# Patient Record
Sex: Female | Born: 1935 | Race: White | Hispanic: No | Smoking: Never smoker
Health system: Southern US, Community
[De-identification: ages and names within clinical notes are randomized; demographics above are authoritative.]

## PROBLEM LIST (undated history)

## (undated) DIAGNOSIS — R51 Headache: Secondary | ICD-10-CM

## (undated) DIAGNOSIS — F329 Major depressive disorder, single episode, unspecified: Secondary | ICD-10-CM

## (undated) DIAGNOSIS — K589 Irritable bowel syndrome without diarrhea: Secondary | ICD-10-CM

## (undated) DIAGNOSIS — I10 Essential (primary) hypertension: Secondary | ICD-10-CM

## (undated) DIAGNOSIS — E785 Hyperlipidemia, unspecified: Secondary | ICD-10-CM

## (undated) DIAGNOSIS — F419 Anxiety disorder, unspecified: Secondary | ICD-10-CM

## (undated) DIAGNOSIS — D649 Anemia, unspecified: Secondary | ICD-10-CM

## (undated) DIAGNOSIS — C801 Malignant (primary) neoplasm, unspecified: Secondary | ICD-10-CM

## (undated) DIAGNOSIS — L930 Discoid lupus erythematosus: Secondary | ICD-10-CM

## (undated) DIAGNOSIS — N289 Disorder of kidney and ureter, unspecified: Secondary | ICD-10-CM

## (undated) DIAGNOSIS — I4891 Unspecified atrial fibrillation: Secondary | ICD-10-CM

## (undated) DIAGNOSIS — G629 Polyneuropathy, unspecified: Secondary | ICD-10-CM

## (undated) DIAGNOSIS — K279 Peptic ulcer, site unspecified, unspecified as acute or chronic, without hemorrhage or perforation: Secondary | ICD-10-CM

## (undated) DIAGNOSIS — F32A Depression, unspecified: Secondary | ICD-10-CM

## (undated) DIAGNOSIS — M519 Unspecified thoracic, thoracolumbar and lumbosacral intervertebral disc disorder: Secondary | ICD-10-CM

## (undated) DIAGNOSIS — M5136 Other intervertebral disc degeneration, lumbar region: Secondary | ICD-10-CM

## (undated) DIAGNOSIS — G5760 Lesion of plantar nerve, unspecified lower limb: Secondary | ICD-10-CM

## (undated) DIAGNOSIS — M51369 Other intervertebral disc degeneration, lumbar region without mention of lumbar back pain or lower extremity pain: Secondary | ICD-10-CM

## (undated) HISTORY — DX: Headache: R51

## (undated) HISTORY — PX: ABDOMINAL HYSTERECTOMY: SHX81

## (undated) HISTORY — PX: LUMBAR LAMINECTOMY: SHX95

## (undated) HISTORY — DX: Discoid lupus erythematosus: L93.0

## (undated) HISTORY — DX: Anxiety disorder, unspecified: F41.9

## (undated) HISTORY — PX: TOTAL KNEE ARTHROPLASTY: SHX125

## (undated) HISTORY — DX: Essential (primary) hypertension: I10

## (undated) HISTORY — DX: Peptic ulcer, site unspecified, unspecified as acute or chronic, without hemorrhage or perforation: K27.9

## (undated) HISTORY — DX: Lesion of plantar nerve, unspecified lower limb: G57.60

## (undated) HISTORY — DX: Anemia, unspecified: D64.9

## (undated) HISTORY — DX: Other intervertebral disc degeneration, lumbar region: M51.36

## (undated) HISTORY — DX: Polyneuropathy, unspecified: G62.9

## (undated) HISTORY — DX: Malignant (primary) neoplasm, unspecified: C80.1

## (undated) HISTORY — DX: Unspecified thoracic, thoracolumbar and lumbosacral intervertebral disc disorder: M51.9

## (undated) HISTORY — DX: Major depressive disorder, single episode, unspecified: F32.9

## (undated) HISTORY — DX: Unspecified atrial fibrillation: I48.91

## (undated) HISTORY — DX: Hyperlipidemia, unspecified: E78.5

## (undated) HISTORY — DX: Irritable bowel syndrome, unspecified: K58.9

## (undated) HISTORY — DX: Other intervertebral disc degeneration, lumbar region without mention of lumbar back pain or lower extremity pain: M51.369

## (undated) HISTORY — DX: Disorder of kidney and ureter, unspecified: N28.9

## (undated) HISTORY — DX: Depression, unspecified: F32.A

---

## 1974-09-02 ENCOUNTER — Encounter: Payer: Self-pay | Admitting: Cardiology

## 2005-08-12 HISTORY — PX: CHOLECYSTECTOMY: SHX55

## 2005-11-17 ENCOUNTER — Encounter: Payer: Self-pay | Admitting: Family Medicine

## 2005-11-17 LAB — CONVERTED CEMR LAB
ALT: 29 units/L
AST: 23 units/L
BUN: 36 mg/dL
CO2: 25 meq/L
Calcium: 10.3 mg/dL
Cholesterol: 250 mg/dL
Creatinine, Ser: 1.6 mg/dL
Glucose, Bld: 98 mg/dL
Hgb A1c MFr Bld: 6 %
MCHC: 33.1 %
MCV: 93.4 fL
Sodium: 143 meq/L

## 2006-03-11 ENCOUNTER — Encounter: Payer: Self-pay | Admitting: Family Medicine

## 2006-03-12 ENCOUNTER — Ambulatory Visit: Payer: Self-pay | Admitting: Family Medicine

## 2006-03-12 DIAGNOSIS — I10 Essential (primary) hypertension: Secondary | ICD-10-CM | POA: Insufficient documentation

## 2006-03-12 DIAGNOSIS — E785 Hyperlipidemia, unspecified: Secondary | ICD-10-CM | POA: Insufficient documentation

## 2006-03-12 DIAGNOSIS — F325 Major depressive disorder, single episode, in full remission: Secondary | ICD-10-CM

## 2006-03-16 ENCOUNTER — Telehealth (INDEPENDENT_AMBULATORY_CARE_PROVIDER_SITE_OTHER): Payer: Self-pay | Admitting: *Deleted

## 2006-03-16 ENCOUNTER — Encounter: Payer: Self-pay | Admitting: Family Medicine

## 2006-03-16 LAB — CONVERTED CEMR LAB
Folate: 11.9 ng/mL
Iron: 54 ug/dL (ref 42–145)
Saturation Ratios: 17 % — ABNORMAL LOW (ref 20–55)
TIBC: 311 ug/dL (ref 250–470)
UIBC: 257 ug/dL
Vitamin B-12: 437 pg/mL (ref 211–911)

## 2006-03-17 LAB — CONVERTED CEMR LAB
ALT: 15 units/L (ref 0–35)
AST: 13 units/L (ref 0–37)
Alkaline Phosphatase: 62 units/L (ref 39–117)
BUN: 53 mg/dL — ABNORMAL HIGH (ref 6–23)
Calcium: 9.4 mg/dL (ref 8.4–10.5)
Chloride: 103 meq/L (ref 96–112)
Creatinine, Ser: 1.71 mg/dL — ABNORMAL HIGH (ref 0.40–1.20)
HCT: 32.4 % — ABNORMAL LOW (ref 36.0–46.0)
MCHC: 29.3 g/dL — ABNORMAL LOW (ref 30.0–36.0)
Platelets: 487 10*3/uL — ABNORMAL HIGH (ref 150–400)
RDW: 14.1 % — ABNORMAL HIGH (ref 11.5–14.0)

## 2006-03-19 ENCOUNTER — Encounter: Payer: Self-pay | Admitting: Family Medicine

## 2006-03-19 ENCOUNTER — Telehealth: Payer: Self-pay | Admitting: Family Medicine

## 2006-03-24 ENCOUNTER — Telehealth: Payer: Self-pay | Admitting: Family Medicine

## 2006-03-30 ENCOUNTER — Ambulatory Visit: Payer: Self-pay | Admitting: Family Medicine

## 2006-03-30 DIAGNOSIS — K602 Anal fissure, unspecified: Secondary | ICD-10-CM

## 2006-04-14 ENCOUNTER — Telehealth: Payer: Self-pay | Admitting: Family Medicine

## 2006-04-16 ENCOUNTER — Ambulatory Visit: Payer: Self-pay | Admitting: Family Medicine

## 2006-04-16 DIAGNOSIS — D509 Iron deficiency anemia, unspecified: Secondary | ICD-10-CM

## 2006-04-19 ENCOUNTER — Telehealth (INDEPENDENT_AMBULATORY_CARE_PROVIDER_SITE_OTHER): Payer: Self-pay | Admitting: *Deleted

## 2006-04-20 ENCOUNTER — Ambulatory Visit: Payer: Self-pay | Admitting: Internal Medicine

## 2006-04-23 ENCOUNTER — Encounter: Payer: Self-pay | Admitting: Family Medicine

## 2006-04-23 ENCOUNTER — Ambulatory Visit: Payer: Self-pay | Admitting: Internal Medicine

## 2006-04-27 ENCOUNTER — Encounter: Payer: Self-pay | Admitting: Family Medicine

## 2006-04-27 DIAGNOSIS — M5137 Other intervertebral disc degeneration, lumbosacral region: Secondary | ICD-10-CM

## 2006-04-28 ENCOUNTER — Encounter: Payer: Self-pay | Admitting: Family Medicine

## 2006-05-06 ENCOUNTER — Ambulatory Visit: Payer: Self-pay | Admitting: Internal Medicine

## 2006-05-17 ENCOUNTER — Ambulatory Visit: Payer: Self-pay | Admitting: Internal Medicine

## 2006-05-17 ENCOUNTER — Encounter: Payer: Self-pay | Admitting: Family Medicine

## 2006-05-17 ENCOUNTER — Encounter (INDEPENDENT_AMBULATORY_CARE_PROVIDER_SITE_OTHER): Payer: Self-pay | Admitting: Specialist

## 2006-05-18 ENCOUNTER — Telehealth: Payer: Self-pay | Admitting: Family Medicine

## 2006-06-23 ENCOUNTER — Ambulatory Visit: Payer: Self-pay | Admitting: Internal Medicine

## 2006-06-23 LAB — CONVERTED CEMR LAB
Basophils Relative: 0.4 % (ref 0.0–1.0)
Eosinophils Absolute: 0 10*3/uL (ref 0.0–0.6)
Eosinophils Relative: 0.6 % (ref 0.0–5.0)
MCV: 85.9 fL (ref 78.0–100.0)
Platelets: 359 10*3/uL (ref 150–400)
RBC: 3.94 M/uL (ref 3.87–5.11)
WBC: 7.4 10*3/uL (ref 4.5–10.5)

## 2006-06-24 ENCOUNTER — Encounter: Payer: Self-pay | Admitting: Family Medicine

## 2006-07-08 ENCOUNTER — Ambulatory Visit: Payer: Self-pay | Admitting: Family Medicine

## 2006-07-20 ENCOUNTER — Ambulatory Visit: Payer: Self-pay | Admitting: Family Medicine

## 2006-08-19 ENCOUNTER — Encounter: Payer: Self-pay | Admitting: Internal Medicine

## 2006-08-19 ENCOUNTER — Encounter: Payer: Self-pay | Admitting: Family Medicine

## 2006-08-19 ENCOUNTER — Ambulatory Visit: Payer: Self-pay | Admitting: Internal Medicine

## 2006-08-19 LAB — CONVERTED CEMR LAB
Basophils Relative: 0.3 % (ref 0.0–1.0)
Eosinophils Absolute: 0.1 10*3/uL (ref 0.0–0.6)
Eosinophils Relative: 1.3 % (ref 0.0–5.0)
HCT: 28.8 % — ABNORMAL LOW (ref 36.0–46.0)
MCV: 84.9 fL (ref 78.0–100.0)
Neutrophils Relative %: 59.1 % (ref 43.0–77.0)
RBC: 3.39 M/uL — ABNORMAL LOW (ref 3.87–5.11)
RDW: 15.1 % — ABNORMAL HIGH (ref 11.5–14.6)
WBC: 5.8 10*3/uL (ref 4.5–10.5)

## 2006-08-24 ENCOUNTER — Encounter: Payer: Self-pay | Admitting: Family Medicine

## 2006-08-24 LAB — CONVERTED CEMR LAB
HDL: 63 mg/dL (ref >39)
LDL Cholesterol: 107 mg/dL — ABNORMAL HIGH (ref 0–99)
Total CHOL/HDL Ratio: 3.3
VLDL: 36 mg/dL (ref 0–40)

## 2006-08-26 ENCOUNTER — Encounter: Payer: Self-pay | Admitting: Family Medicine

## 2006-08-31 ENCOUNTER — Encounter: Payer: Self-pay | Admitting: Internal Medicine

## 2006-08-31 LAB — CONVERTED CEMR LAB: Iron: 60 ug/dL (ref 42–145)

## 2006-09-15 ENCOUNTER — Encounter: Admission: RE | Admit: 2006-09-15 | Discharge: 2006-09-15 | Payer: Self-pay | Admitting: Family Medicine

## 2006-09-15 ENCOUNTER — Ambulatory Visit: Payer: Self-pay | Admitting: Family Medicine

## 2006-09-15 DIAGNOSIS — N183 Chronic kidney disease, stage 3 (moderate): Secondary | ICD-10-CM

## 2006-09-16 ENCOUNTER — Telehealth: Payer: Self-pay | Admitting: Family Medicine

## 2006-09-17 ENCOUNTER — Telehealth: Payer: Self-pay | Admitting: Family Medicine

## 2006-09-17 ENCOUNTER — Encounter: Admission: RE | Admit: 2006-09-17 | Discharge: 2006-09-17 | Payer: Self-pay | Admitting: Family Medicine

## 2006-09-17 ENCOUNTER — Encounter: Payer: Self-pay | Admitting: Family Medicine

## 2006-09-17 LAB — CONVERTED CEMR LAB
BUN: 39 mg/dL — ABNORMAL HIGH (ref 6–23)
Creatinine, Ser: 1.2 mg/dL (ref 0.40–1.20)

## 2006-09-20 ENCOUNTER — Telehealth (INDEPENDENT_AMBULATORY_CARE_PROVIDER_SITE_OTHER): Payer: Self-pay | Admitting: *Deleted

## 2006-09-20 ENCOUNTER — Telehealth: Payer: Self-pay | Admitting: Family Medicine

## 2006-09-30 LAB — CONVERTED CEMR LAB
HCT: 31.9 %
Hemoglobin: 9.8 g/dL
WBC, blood: 5.3 10*3/uL

## 2006-10-07 ENCOUNTER — Telehealth: Payer: Self-pay | Admitting: Family Medicine

## 2006-10-07 ENCOUNTER — Encounter: Payer: Self-pay | Admitting: Family Medicine

## 2006-10-08 ENCOUNTER — Telehealth: Payer: Self-pay | Admitting: Family Medicine

## 2006-10-12 ENCOUNTER — Ambulatory Visit: Payer: Self-pay | Admitting: Family Medicine

## 2006-10-13 ENCOUNTER — Encounter: Payer: Self-pay | Admitting: Family Medicine

## 2006-10-13 ENCOUNTER — Telehealth (INDEPENDENT_AMBULATORY_CARE_PROVIDER_SITE_OTHER): Payer: Self-pay | Admitting: *Deleted

## 2006-10-13 LAB — CONVERTED CEMR LAB
CO2: 24 meq/L (ref 19–32)
Calcium: 9.1 mg/dL (ref 8.4–10.5)
Chloride: 107 meq/L (ref 96–112)
Creatinine, Ser: 0.99 mg/dL (ref 0.40–1.20)
Glucose, Bld: 89 mg/dL (ref 70–99)
Total Bilirubin: 0.3 mg/dL (ref 0.3–1.2)

## 2006-10-14 ENCOUNTER — Ambulatory Visit (HOSPITAL_COMMUNITY): Admission: RE | Admit: 2006-10-14 | Discharge: 2006-10-14 | Payer: Self-pay | Admitting: Internal Medicine

## 2006-11-09 ENCOUNTER — Ambulatory Visit: Payer: Self-pay | Admitting: Family Medicine

## 2006-11-09 LAB — CONVERTED CEMR LAB
Glucose, 2 hr postprandial: 140 mg/dL
Hgb A1c MFr Bld: 5.4 %

## 2006-11-16 ENCOUNTER — Encounter: Payer: Self-pay | Admitting: Family Medicine

## 2006-11-18 ENCOUNTER — Encounter: Payer: Self-pay | Admitting: Family Medicine

## 2006-11-24 ENCOUNTER — Encounter: Payer: Self-pay | Admitting: Family Medicine

## 2006-11-24 LAB — CONVERTED CEMR LAB
HCT: 37.7 %
Hemoglobin: 11.7 g/dL

## 2006-12-13 ENCOUNTER — Encounter: Payer: Self-pay | Admitting: Family Medicine

## 2006-12-31 ENCOUNTER — Telehealth: Payer: Self-pay | Admitting: Family Medicine

## 2007-01-25 ENCOUNTER — Telehealth (INDEPENDENT_AMBULATORY_CARE_PROVIDER_SITE_OTHER): Payer: Self-pay | Admitting: *Deleted

## 2007-01-27 ENCOUNTER — Ambulatory Visit: Payer: Self-pay | Admitting: Family Medicine

## 2007-01-27 DIAGNOSIS — R519 Headache, unspecified: Secondary | ICD-10-CM | POA: Insufficient documentation

## 2007-01-27 DIAGNOSIS — I6529 Occlusion and stenosis of unspecified carotid artery: Secondary | ICD-10-CM | POA: Insufficient documentation

## 2007-01-27 DIAGNOSIS — R51 Headache: Secondary | ICD-10-CM | POA: Insufficient documentation

## 2007-01-27 LAB — CONVERTED CEMR LAB: Hemoglobin: 11.8 g/dL

## 2007-02-02 ENCOUNTER — Telehealth: Payer: Self-pay | Admitting: Family Medicine

## 2007-02-02 ENCOUNTER — Encounter: Payer: Self-pay | Admitting: Family Medicine

## 2007-02-03 ENCOUNTER — Telehealth (INDEPENDENT_AMBULATORY_CARE_PROVIDER_SITE_OTHER): Payer: Self-pay | Admitting: *Deleted

## 2007-02-03 ENCOUNTER — Encounter: Payer: Self-pay | Admitting: Family Medicine

## 2007-02-10 ENCOUNTER — Ambulatory Visit: Payer: Self-pay | Admitting: Family Medicine

## 2007-02-14 LAB — CONVERTED CEMR LAB
Ferritin: 206 ng/mL (ref 10–291)
Iron: 59 ug/dL (ref 42–145)
TIBC: 297 ug/dL (ref 250–470)
UIBC: 238 ug/dL

## 2007-02-23 ENCOUNTER — Telehealth (INDEPENDENT_AMBULATORY_CARE_PROVIDER_SITE_OTHER): Payer: Self-pay | Admitting: *Deleted

## 2007-02-25 ENCOUNTER — Ambulatory Visit: Payer: Self-pay | Admitting: Family Medicine

## 2007-05-10 ENCOUNTER — Encounter: Payer: Self-pay | Admitting: Family Medicine

## 2007-05-11 ENCOUNTER — Ambulatory Visit: Payer: Self-pay | Admitting: Family Medicine

## 2007-05-11 LAB — CONVERTED CEMR LAB
Glucose, Bld: 117 mg/dL
Hgb A1c MFr Bld: 5.9 %

## 2007-05-20 ENCOUNTER — Telehealth: Payer: Self-pay | Admitting: Family Medicine

## 2007-05-20 DIAGNOSIS — M7730 Calcaneal spur, unspecified foot: Secondary | ICD-10-CM | POA: Insufficient documentation

## 2007-05-28 DIAGNOSIS — K649 Unspecified hemorrhoids: Secondary | ICD-10-CM | POA: Insufficient documentation

## 2007-05-28 DIAGNOSIS — N259 Disorder resulting from impaired renal tubular function, unspecified: Secondary | ICD-10-CM | POA: Insufficient documentation

## 2007-05-28 DIAGNOSIS — K259 Gastric ulcer, unspecified as acute or chronic, without hemorrhage or perforation: Secondary | ICD-10-CM | POA: Insufficient documentation

## 2007-05-31 ENCOUNTER — Encounter: Payer: Self-pay | Admitting: Family Medicine

## 2007-06-01 ENCOUNTER — Encounter: Payer: Self-pay | Admitting: Family Medicine

## 2007-06-01 DIAGNOSIS — R7301 Impaired fasting glucose: Secondary | ICD-10-CM

## 2007-06-01 LAB — CONVERTED CEMR LAB: Glucose, 2 hour: 160 mg/dL — ABNORMAL HIGH (ref 70–139)

## 2007-07-29 ENCOUNTER — Ambulatory Visit: Payer: Self-pay | Admitting: Family Medicine

## 2007-09-02 ENCOUNTER — Encounter: Payer: Self-pay | Admitting: Family Medicine

## 2007-09-06 ENCOUNTER — Encounter: Payer: Self-pay | Admitting: Family Medicine

## 2007-09-09 ENCOUNTER — Encounter: Payer: Self-pay | Admitting: Family Medicine

## 2007-09-26 ENCOUNTER — Telehealth: Payer: Self-pay | Admitting: Family Medicine

## 2007-10-25 ENCOUNTER — Ambulatory Visit: Payer: Self-pay | Admitting: Family Medicine

## 2007-11-03 ENCOUNTER — Encounter: Payer: Self-pay | Admitting: Family Medicine

## 2007-11-04 ENCOUNTER — Encounter: Payer: Self-pay | Admitting: Family Medicine

## 2007-11-08 ENCOUNTER — Encounter: Payer: Self-pay | Admitting: Family Medicine

## 2007-11-08 LAB — CONVERTED CEMR LAB
ALT: 13 units/L (ref 0–35)
BUN: 22 mg/dL (ref 6–23)
CO2: 23 meq/L (ref 19–32)
Calcium: 9.3 mg/dL (ref 8.4–10.5)
Chloride: 101 meq/L (ref 96–112)
Cholesterol: 227 mg/dL — ABNORMAL HIGH (ref 0–200)
Creatinine, Ser: 1.05 mg/dL (ref 0.40–1.20)
HCT: 37.6 % (ref 36.0–46.0)
HDL: 60 mg/dL (ref >39)
Hemoglobin: 12.1 g/dL (ref 12.0–15.0)
MCV: 91.7 fL (ref 78.0–100.0)
Platelets: 208 10*3/uL (ref 150–400)
Total Bilirubin: 0.6 mg/dL (ref 0.3–1.2)
Total CHOL/HDL Ratio: 3.8
Triglycerides: 197 mg/dL — ABNORMAL HIGH (ref <150)
VLDL: 39 mg/dL (ref 0–40)
WBC: 6.5 10*3/uL (ref 4.0–10.5)

## 2007-11-10 ENCOUNTER — Encounter: Payer: Self-pay | Admitting: Family Medicine

## 2007-11-11 ENCOUNTER — Telehealth: Payer: Self-pay | Admitting: Family Medicine

## 2007-11-11 ENCOUNTER — Encounter: Payer: Self-pay | Admitting: Family Medicine

## 2007-11-11 DIAGNOSIS — C50919 Malignant neoplasm of unspecified site of unspecified female breast: Secondary | ICD-10-CM | POA: Insufficient documentation

## 2007-11-13 HISTORY — PX: OTHER SURGICAL HISTORY: SHX169

## 2007-11-29 ENCOUNTER — Ambulatory Visit: Payer: Self-pay | Admitting: Family Medicine

## 2007-11-29 DIAGNOSIS — K589 Irritable bowel syndrome without diarrhea: Secondary | ICD-10-CM | POA: Insufficient documentation

## 2007-11-29 DIAGNOSIS — G609 Hereditary and idiopathic neuropathy, unspecified: Secondary | ICD-10-CM | POA: Insufficient documentation

## 2007-11-29 LAB — CONVERTED CEMR LAB: Hgb A1c MFr Bld: 6.2 %

## 2007-12-01 ENCOUNTER — Encounter: Payer: Self-pay | Admitting: Family Medicine

## 2007-12-05 ENCOUNTER — Telehealth: Payer: Self-pay | Admitting: Family Medicine

## 2007-12-12 ENCOUNTER — Encounter: Payer: Self-pay | Admitting: Family Medicine

## 2007-12-22 ENCOUNTER — Ambulatory Visit: Payer: Self-pay | Admitting: Family Medicine

## 2007-12-22 LAB — CONVERTED CEMR LAB
Glucose, Urine, Semiquant: NEGATIVE
Protein, U semiquant: >=300
Specific Gravity, Urine: 1.02
pH: 7

## 2007-12-23 ENCOUNTER — Encounter: Payer: Self-pay | Admitting: Family Medicine

## 2007-12-27 ENCOUNTER — Encounter: Payer: Self-pay | Admitting: Family Medicine

## 2008-03-06 ENCOUNTER — Encounter: Payer: Self-pay | Admitting: Family Medicine

## 2008-03-07 ENCOUNTER — Telehealth (INDEPENDENT_AMBULATORY_CARE_PROVIDER_SITE_OTHER): Payer: Self-pay | Admitting: *Deleted

## 2008-03-13 ENCOUNTER — Telehealth: Payer: Self-pay | Admitting: Family Medicine

## 2008-03-13 ENCOUNTER — Encounter: Payer: Self-pay | Admitting: Family Medicine

## 2008-03-15 ENCOUNTER — Encounter: Payer: Self-pay | Admitting: Family Medicine

## 2008-03-20 ENCOUNTER — Encounter: Payer: Self-pay | Admitting: Family Medicine

## 2008-03-28 ENCOUNTER — Ambulatory Visit: Payer: Self-pay | Admitting: Family Medicine

## 2008-03-28 DIAGNOSIS — L930 Discoid lupus erythematosus: Secondary | ICD-10-CM | POA: Insufficient documentation

## 2008-04-17 ENCOUNTER — Encounter: Payer: Self-pay | Admitting: Family Medicine

## 2008-04-30 ENCOUNTER — Telehealth: Payer: Self-pay | Admitting: Family Medicine

## 2008-06-19 ENCOUNTER — Encounter: Payer: Self-pay | Admitting: Family Medicine

## 2008-06-22 ENCOUNTER — Encounter: Payer: Self-pay | Admitting: Family Medicine

## 2008-07-19 ENCOUNTER — Encounter: Payer: Self-pay | Admitting: Family Medicine

## 2008-07-24 ENCOUNTER — Telehealth (INDEPENDENT_AMBULATORY_CARE_PROVIDER_SITE_OTHER): Payer: Self-pay | Admitting: *Deleted

## 2008-07-31 ENCOUNTER — Encounter: Payer: Self-pay | Admitting: Family Medicine

## 2008-08-01 ENCOUNTER — Ambulatory Visit: Payer: Self-pay | Admitting: Family Medicine

## 2008-09-11 ENCOUNTER — Encounter: Payer: Self-pay | Admitting: Family Medicine

## 2008-10-18 ENCOUNTER — Encounter: Payer: Self-pay | Admitting: Family Medicine

## 2008-10-23 ENCOUNTER — Encounter: Payer: Self-pay | Admitting: Family Medicine

## 2008-12-03 ENCOUNTER — Ambulatory Visit: Payer: Self-pay | Admitting: Family Medicine

## 2008-12-03 LAB — CONVERTED CEMR LAB: Hgb A1c MFr Bld: 5.6 %

## 2008-12-14 ENCOUNTER — Encounter: Payer: Self-pay | Admitting: Family Medicine

## 2008-12-17 LAB — CONVERTED CEMR LAB
AST: 22 units/L (ref 0–37)
Albumin: 4.3 g/dL (ref 3.5–5.2)
Alkaline Phosphatase: 60 units/L (ref 39–117)
Calcium: 9.3 mg/dL (ref 8.4–10.5)
Chloride: 105 meq/L (ref 96–112)
Eosinophils Relative: 1 % (ref 0–5)
Glucose, Bld: 91 mg/dL (ref 70–99)
HCT: 37.5 % (ref 36.0–46.0)
LDL Cholesterol: 129 mg/dL — ABNORMAL HIGH (ref 0–99)
Lymphocytes Relative: 29 % (ref 12–46)
Lymphs Abs: 1.1 10*3/uL (ref 0.7–4.0)
MCV: 97.2 fL (ref 78.0–100.0)
Monocytes Relative: 7 % (ref 3–12)
Platelets: 156 10*3/uL (ref 150–400)
Potassium: 4.1 meq/L (ref 3.5–5.3)
RBC: 3.86 M/uL — ABNORMAL LOW (ref 3.87–5.11)
Sodium: 142 meq/L (ref 135–145)
Total Protein: 6.8 g/dL (ref 6.0–8.3)
Vit D, 25-Hydroxy: 34 ng/mL (ref 30–89)
Vitamin B-12: 371 pg/mL (ref 211–911)
WBC: 3.9 10*3/uL — ABNORMAL LOW (ref 4.0–10.5)

## 2008-12-19 ENCOUNTER — Ambulatory Visit: Payer: Self-pay | Admitting: Family Medicine

## 2008-12-25 ENCOUNTER — Encounter: Payer: Self-pay | Admitting: Family Medicine

## 2009-01-01 ENCOUNTER — Ambulatory Visit: Payer: Self-pay | Admitting: Family Medicine

## 2009-01-28 ENCOUNTER — Encounter: Payer: Self-pay | Admitting: Family Medicine

## 2009-01-29 ENCOUNTER — Encounter: Payer: Self-pay | Admitting: Family Medicine

## 2009-01-30 ENCOUNTER — Encounter: Payer: Self-pay | Admitting: Family Medicine

## 2009-02-14 ENCOUNTER — Ambulatory Visit: Payer: Self-pay | Admitting: Family Medicine

## 2009-02-21 ENCOUNTER — Encounter: Payer: Self-pay | Admitting: Family Medicine

## 2009-02-26 ENCOUNTER — Encounter: Payer: Self-pay | Admitting: Family Medicine

## 2009-03-28 ENCOUNTER — Encounter: Payer: Self-pay | Admitting: Family Medicine

## 2009-04-01 ENCOUNTER — Ambulatory Visit: Payer: Self-pay | Admitting: Family Medicine

## 2009-04-01 LAB — CONVERTED CEMR LAB
Bilirubin Urine: NEGATIVE
Glucose, Urine, Semiquant: NEGATIVE
Specific Gravity, Urine: 1.015
pH: 5

## 2009-04-02 ENCOUNTER — Encounter: Payer: Self-pay | Admitting: Family Medicine

## 2009-04-12 ENCOUNTER — Ambulatory Visit: Payer: Self-pay | Admitting: Family Medicine

## 2009-04-12 LAB — CONVERTED CEMR LAB
Nitrite: POSITIVE
Specific Gravity, Urine: 1.015

## 2009-04-13 ENCOUNTER — Encounter: Payer: Self-pay | Admitting: Family Medicine

## 2009-05-06 ENCOUNTER — Encounter: Payer: Self-pay | Admitting: Family Medicine

## 2009-05-16 ENCOUNTER — Ambulatory Visit: Payer: Self-pay | Admitting: Family Medicine

## 2009-05-30 ENCOUNTER — Encounter: Payer: Self-pay | Admitting: Family Medicine

## 2009-06-03 ENCOUNTER — Telehealth: Payer: Self-pay | Admitting: Family Medicine

## 2009-06-03 LAB — CONVERTED CEMR LAB: Glucose, 2 hour: 98 mg/dL (ref 70–139)

## 2009-08-13 ENCOUNTER — Ambulatory Visit: Payer: Self-pay | Admitting: Family Medicine

## 2009-08-30 ENCOUNTER — Encounter: Payer: Self-pay | Admitting: Family Medicine

## 2009-09-02 ENCOUNTER — Encounter: Payer: Self-pay | Admitting: Family Medicine

## 2009-09-02 ENCOUNTER — Telehealth: Payer: Self-pay | Admitting: Family Medicine

## 2009-09-04 ENCOUNTER — Ambulatory Visit: Payer: Self-pay | Admitting: Family Medicine

## 2009-09-04 DIAGNOSIS — F411 Generalized anxiety disorder: Secondary | ICD-10-CM | POA: Insufficient documentation

## 2009-10-02 ENCOUNTER — Encounter: Payer: Self-pay | Admitting: Family Medicine

## 2009-10-14 ENCOUNTER — Telehealth: Payer: Self-pay | Admitting: Family Medicine

## 2009-10-15 ENCOUNTER — Encounter: Payer: Self-pay | Admitting: Family Medicine

## 2009-10-29 ENCOUNTER — Ambulatory Visit: Payer: Self-pay | Admitting: Family Medicine

## 2009-10-31 ENCOUNTER — Encounter: Payer: Self-pay | Admitting: Family Medicine

## 2009-11-05 ENCOUNTER — Ambulatory Visit: Payer: Self-pay | Admitting: Family Medicine

## 2009-11-14 ENCOUNTER — Encounter: Payer: Self-pay | Admitting: Family Medicine

## 2009-11-19 ENCOUNTER — Ambulatory Visit: Payer: Self-pay | Admitting: Family Medicine

## 2010-02-11 ENCOUNTER — Encounter: Payer: Self-pay | Admitting: Family Medicine

## 2010-02-11 NOTE — Progress Notes (Signed)
Summary: Clearance- surgival  Phone Note Call from Patient Call back at Home Phone [phone]   Caller: Patient Call For: Loyal Gambler DO Summary of Call: Pt having cataract surgery and MD told her she needs medical clearance for this from her PCP.  Initial call taken by: Geanie Kenning LPN,  October  3, 624THL 4:00 PM  Follow-up for Phone Call        needs 30 min visit. Follow-up by: Loyal Gambler DO,  October 14, 2009 5:03 PM     Appended Document: Clearance- surgival LMOM informing Pt of the above

## 2010-02-11 NOTE — Miscellaneous (Signed)
Summary: echo: normal EF.  Clinical Lists Changes  Observations: Added new observation of ECHOINTERP: Normal LV wall motion EF 60-65% Mild dilation of the L atrium.  done at [facility] (01/28/2009 12:30)      Echocardiogram  Procedure date:  01/28/2009  Findings:      Normal LV wall motion EF 60-65% Mild dilation of the L atrium.  done at [facility]   Echocardiogram  Procedure date:  01/28/2009  Findings:      Normal LV wall motion EF 60-65% Mild dilation of the L atrium.  done at [facility]

## 2010-02-11 NOTE — Letter (Signed)
Summary: [facility]   Imported By: Edmonia James 05/20/2009 08:41:13  _____________________________________________________________________  External Attachment:    Type:   Image     Comment:   External Document

## 2010-02-11 NOTE — Letter (Signed)
Summary: [facility]  [facility]   Imported By: Edmonia James 09/09/2009 10:11:15  _____________________________________________________________________  External Attachment:    Type:   Image     Comment:   External Document

## 2010-02-11 NOTE — Consult Note (Signed)
Summary: [facility]   Imported By: Edmonia James 02/27/2009 09:28:09  _____________________________________________________________________  External Attachment:    Type:   Image     Comment:   External Document

## 2010-02-11 NOTE — Consult Note (Signed)
Summary: [facility]   Imported By: Edmonia James 11/25/2009 08:11:38  _____________________________________________________________________  External Attachment:    Type:   Image     Comment:   External Document

## 2010-02-11 NOTE — Assessment & Plan Note (Signed)
Summary: f/u HTN   Vital Signs:  Patient profile:   77 year old female Height:      65 inches Weight:      201 pounds BMI:     33.57 O2 Sat:      97 % on Room air Pulse rate:   106 / minute BP sitting:   148 / 82  (left arm) Cuff size:   large  Vitals Entered By: Sherlean Foot CMA (November 19, 2009 3:27 PM)  O2 Flow:  Room air CC: F/U HTN    Primary Care Provider:  Loyal Gambler D.O.  CC:  F/U HTN .  History of Present Illness: 77 yo WF presents for f/u HTN.  She is doing well on her medications.  Her home BPs are running 120s to 145/ 70s.  Denies CP, DOE, palpiations or leg edema.  Feels well.  Her BP just prior to getting here was 120/70 but she is always higher here.  Her burn is healing up and she's back in water aerobics.     Current Medications (verified): 1)  Metformin Hcl 500 Mg  Tabs (Metformin Hcl) .... Take 1 Tablet By Mouth Two Times A Day 2)  Caltrate 600+d 600-400 Mg-Unit  Tabs (Calcium Carbonate-Vitamin D) .... Take 1 Tablet By Mouth Two Times A Day 3)  Benazepril Hcl 40 Mg  Tabs (Benazepril Hcl) .Marland Kitchen.. 1 Tab By Mouth Daily 4)  One Touch Ultra Test Strips .... Use Two Times A Day As Directed 5)  Super Omega-3 1000 Mg  Caps (Omega-3 Fatty Acids) .... Take Two By Mouth Two Times Daily 6)  Sucralfate 1 Gm  Tabs (Sucralfate) .Marland Kitchen.. 1 Tab Po Two Times A Day 7)  Oxybutynin Chloride 5 Mg Tabs (Oxybutynin Chloride) .... Take 1 Tablet By Mouth Two Times A Day 8)  Hydrochlorothiazide 25 Mg Tabs (Hydrochlorothiazide) .Marland Kitchen.. 1 Tab By Mouth Qam 9)  Tramadol Hcl 50 Mg Tabs (Tramadol Hcl) .Marland Kitchen.. 1 Tab By Mouth Two Times A Day As Needed Pain 10)  Norvasc 5 Mg Tabs (Amlodipine Besylate) .... 1/2 Tab By Mouth Daily 11)  Cymbalta 60 Mg Cpep (Duloxetine Hcl) .Marland Kitchen.. 1 Capsule By Mouth Daily 12)  Lorazepam 0.5 Mg Tabs (Lorazepam) .Marland Kitchen.. 1 Tab By Mouth Two Times A Day As Needed Anxiety  Allergies (verified): 1)  ! Sulfa 2)  ! Codeine 3)  ! Zocor 4)  ! * Gabapentin 5)  ! Asa 6)  ! Beta  Blockers  Past History:  Past Medical History: Reviewed history from 09/04/2009 and no changes required. Depression/ Anxiety Impaired fasting glucose Hyperlipidemia Hypertension heart murmur diabetic retinopathy- Dr Jodi Mourning OA; Lumbar DDD- Dr Sabino Gasser [facility] Neuroma - Dr Wardell Honour foot neuropathy - Dr Starleen Blue mild renal insufficiency normal adenosine CL 2003, Dr Geanie Logan ABIs 1.45 bilat 7-07 PUD Discoid Lupus  derm : Dr Jerrel Ivory R breast Cancer 11-09 Dr Georgiann Cocker onc, Dr Robina Ade gen surgery [facility] (Dr Domingo Cocking)--  GI:    Past Surgical History: Reviewed history from 12/22/2007 and no changes required. Hysterectomy+ 1 oophorectomy Total knee replacement- both- Dr Devona Konig Lumbar Laminectomy heart cath x 3 "normal" ([place]) cholecystectomy 8-07  R partial mastectomy with sentinel lymph node biopsy 11-09  Social History: Reviewed history from 09/04/2009 and no changes required. 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  widowed for 7 yrs has a cat lives in [place] appt family in area, 3 sons uses rolling walker and wheelchair  Review of Systems      See HPI  Physical Exam  General:  obese WF in NAD Head:  normocephalic and atraumatic.   Mouth:  pharynx pink and moist.   Neck:  no masses.   Lungs:  Normal respiratory effort, chest expands symmetrically. Lungs are clear to auscultation, no crackles or wheezes. Heart:  no murmur and tachycardia.   Pulses:  2+ radial pulses Extremities:  no LE edema Skin:  healing burn over anterior thighs scarring alopecia, stable pink scar over R eyebrow Cervical Nodes:  No lymphadenopathy noted Psych:  good eye contact, not anxious appearing, and not depressed appearing.     Impression & Recommendations:  Problem # 1:  HYPERTENSION, BENIGN ESSENTIAL (ICD-401.1) BP high here but perfect at home.  Will not change her meds.  OV in 3 mos and will update labs then. Her updated medication list for this problem includes:     Benazepril Hcl 40 Mg Tabs (Benazepril hcl) .Marland Kitchen... 1 tab by mouth daily    Hydrochlorothiazide 25 Mg Tabs (Hydrochlorothiazide) .Marland Kitchen... 1 tab by mouth qam    Norvasc 5 Mg Tabs (Amlodipine besylate) .Marland Kitchen... 1/2 tab by mouth daily  BP today: 148/82 Prior BP: 165/91 (11/05/2009)  Prior 10 Yr Risk Heart Disease: 17 % (05/11/2007)  Labs Reviewed: K+: 4.1 (12/14/2008) Creat: : 0.93 (12/14/2008)   Chol: 223 (12/14/2008)   HDL: 73 (12/14/2008)   LDL: 129 (12/14/2008)   TG: 106 (12/14/2008)  Problem # 2:  IMPAIRED FASTING GLUCOSE (ICD-790.21) Will plan to get her A1C with next set of labs.  Continue Metformin and diabetic diet.  Working on wt loss. 2 hr OGTT off metformin done 5-11 confirmed IFG and not T2DM. Her updated medication list for this problem includes:    Metformin Hcl 500 Mg Tabs (Metformin hcl) .Marland Kitchen... Take 1 tablet by mouth two times a day  Labs Reviewed: Creat: 0.93 (12/14/2008)     Complete Medication List: 1)  Metformin Hcl 500 Mg Tabs (Metformin hcl) .... Take 1 tablet by mouth two times a day 2)  Caltrate 600+d 600-400 Mg-unit Tabs (Calcium carbonate-vitamin d) .... Take 1 tablet by mouth two times a day 3)  Benazepril Hcl 40 Mg Tabs (Benazepril hcl) .Marland Kitchen.. 1 tab by mouth daily 4)  One Touch Ultra Test Strips  .... Use two times a day as directed 5)  Super Omega-3 1000 Mg Caps (Omega-3 fatty acids) .... Take two by mouth two times daily 6)  Sucralfate 1 Gm Tabs (Sucralfate) .Marland Kitchen.. 1 tab po two times a day 7)  Oxybutynin Chloride 5 Mg Tabs (Oxybutynin chloride) .... Take 1 tablet by mouth two times a day 8)  Hydrochlorothiazide 25 Mg Tabs (Hydrochlorothiazide) .Marland Kitchen.. 1 tab by mouth qam 9)  Tramadol Hcl 50 Mg Tabs (Tramadol hcl) .Marland Kitchen.. 1 tab by mouth two times a day as needed pain 10)  Norvasc 5 Mg Tabs (Amlodipine besylate) .... 1/2 tab by mouth daily 11)  Cymbalta 60 Mg Cpep (Duloxetine hcl) .Marland Kitchen.. 1 capsule by mouth daily 12)  Lorazepam 0.5 Mg Tabs (Lorazepam) .Marland Kitchen.. 1 tab by mouth two times  a day as needed anxiety  Other Orders: Flu Vaccine 36yrs + MEDICARE PATIENTS [id number]) Administration Flu vaccine - MCR [id number])  Patient Instructions: 1)  Home BPs look great. 2)  Stay on current meds. 3)  Keep working on diabetic diet/ water aerobics/ wt loss. 4)  Check sugars 1 x a wk.  Goal is 70-100. 5)  Return for follow up BP/ IFG/ fasting labs in 3 mos.   Orders Added: 1)  Flu Vaccine 38yrs + MEDICARE PATIENTS [Q2039] 2)  Administration Flu vaccine - MCR [id number] 3)  Est. Patient Level III [id number] Flu Vaccine Consent Questions     Do you have a history of severe allergic reactions to this vaccine? no    Any prior history of allergic reactions to egg and/or gelatin? no    Do you have a sensitivity to the preservative Thimersol? no    Do you have a past history of Guillan-Barre Syndrome? no    Do you currently have an acute febrile illness? no    Have you ever had a severe reaction to latex? no    Vaccine information given and explained to patient? yes    Are you currently pregnant? no    Lot Number:[id number]   Exp Date:07/12/2010   Site Given  Left Deltoid IM+ MEDICARE PATIENTS [Q2039] 2)  Administration Flu vaccine - MCR [G0008]     .lbmedflu

## 2010-02-11 NOTE — Miscellaneous (Signed)
Summary: mammogram; needs diagnostic (R) in 6 mos  Clinical Lists Changes  Observations: Added new observation of MAMMRECACT: Diagnostic R mammo needed in 6 mos for f/u. (10/01/2009 10:09) Added new observation of MAMMOGRAM: Location: Breast Clinic ([place]).     Personal hx of R breast CA, lumpectomy, radiation therapy and chemo w/in 2 yrs.  Assessment: BIRADS 3. Probably benign  (10/01/2009 10:09)      Mammogram  Procedure date:  10/01/2009  Findings:      Location: Breast Clinic ([place]).     Personal hx of R breast CA, lumpectomy, radiation therapy and chemo w/in 2 yrs.  Assessment: BIRADS 3. Probably benign   Comments:      Diagnostic R mammo needed in 6 mos for f/u.   Mammogram  Procedure date:  10/01/2009  Findings:      Location: Breast Clinic ([place]).     Personal hx of R breast CA, lumpectomy, radiation therapy and chemo w/in 2 yrs.  Assessment: BIRADS 3. Probably benign   Comments:      Diagnostic R mammo needed in 6 mos for f/u.

## 2010-02-11 NOTE — Assessment & Plan Note (Signed)
Summary: allergies/ UTI   Vital Signs:  Patient profile:   77 year old [place] Height:      65 inches Weight:      199 pounds BMI:     33.24 O2 Sat:      98 % on Room air Temp:     97.4 degrees F oral Pulse rate:   102 / minute BP sitting:   107 / 81  (left arm) Cuff size:   large  Vitals Entered By: Sherlean Foot CMA (April 01, 2009 11:34 AM)  O2 Flow:  Room air CC: ST from allergies and ? UTI   Primary Care Provider:  Loyal Gambler D.O.  CC:  ST from allergies and ? UTI.  History of Present Illness: 77 yo WF presents for right sided back/flank pain x7 days. Pain is a 8/10 ache that is constant. Worse with sitting. Relieved by Tylenol two pills 2x/day. She had seen Dr. Franki Monte one month ago and was diagnosed with a UTI. She was prescribed Nitrofurantn 100mg  daily x7 days. Her UTI symptoms improved, however she thinks the pain she presents with today is the same pain she had then. She denies burning, pain or bleeding with urination. No fever or chills. No nausea or vomiting.   She also has hoarsness, postnasal drip, minimal rhinorrhea and scratchy throat for about a wk.  She usually has allergies at the start of Spring.  Did well on Claritin in the past.  Not taking anything now.  Denies nasal congestion or sinus pressure.  Denies F/C.  Allergies: 1)  ! Sulfa 2)  ! Codeine 3)  ! Zocor 4)  ! * Gabapentin  Past History:  Past Medical History: Reviewed history from 12/03/2008 and no changes required. Depression Impaired fasting glucose Hyperlipidemia Hypertension heart murmur diabetic retinopathy- Dr Jodi Mourning OA; Lumbar DDD- Dr Sabino Gasser [facility] Neuroma - Dr Wardell Honour foot neuropathy - Dr Starleen Blue mild renal insufficiency normal adenosine CL 2003, Dr Geanie Logan ABIs 1.45 bilat 7-07 PUD Discoid Lupus  derm : Dr Jerrel Ivory R breast Cancer 11-09 Dr Georgiann Cocker onc, Dr Robina Ade gen surgery [facility] (Dr Domingo Cocking)--  GI:    Past Surgical History: Reviewed history from 12/22/2007 and no  changes required. Hysterectomy+ 1 oophorectomy Total knee replacement- both- Dr Devona Konig Lumbar Laminectomy heart cath x 3 "normal" ([place]) cholecystectomy 8-07  R partial mastectomy with sentinel lymph node biopsy 11-09  Family History: Reviewed history from 03/12/2006 and no changes required. father colon cancer, DM, stroke, high chol mother DM  Social History: Reviewed history from 11/29/2007 and no changes required. 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   Mouth:  pharynx pink and moist and postnasal drip.   Neck:  supple and full ROM.   Lungs:  Normal respiratory effort, chest expands symmetrically. Lungs are clear to auscultation, no crackles or wheezes. Heart:  Heart rate, rhythm regular. Murmur auscultated.  Abdomen:  R flank tenderness.  no suprapubic TTP. Extremities:  no LE edema Skin:  turgor normal and color normal.   Cervical Nodes:  no anterior cervical adenopathy.   Psych:  Oriented X3, normally interactive, good eye contact, not anxious appearing, and not depressed appearing.     Impression & Recommendations:  Problem # 1:  UTI (ICD-599.0)  UA grossly + for infection.  Send for cx. Treat with Macrobid and f/u c&s results.  Her updated medication list for this problem includes:    Oxybutynin Chloride 5 Mg Tabs (Oxybutynin chloride) .Marland Kitchen... Take 1 tablet by mouth two times a day    Macrobid 100 Mg Caps (Nitrofurantoin monohyd macro) .Marland Kitchen... 1 capsule by mouth two times a day x 7 days  Orders: UA Dipstick w/o Micro (manual) (81002) T-Culture, Urine [id number])  Problem # 2:  POSTNASAL DRIP SYNDROME, ALLERGIC (ICD-477.9) Treat OTC Claritin daily for Spring season allergies.     Complete Medication List: 1)  Metformin Hcl 500 Mg Tabs (Metformin hcl) .... Take 1 tablet by mouth two times a day 2)  Caltrate 600+d 600-400 Mg-unit Tabs (Calcium carbonate-vitamin d) .... Take 1 tablet by mouth two times a day 3)  Benazepril Hcl 40 Mg Tabs (Benazepril hcl) .Marland Kitchen.. 1 tab by mouth daily 4)  Cymbalta 60 Mg Cpep (Duloxetine hcl) .Marland Kitchen.. 1 tab by mouth daily 5)  One Touch Ultra Test Strips  .... Use two times a day as directed 6)  Super Omega-3 1000 Mg Caps (Omega-3 fatty acids) .... Take two by mouth two times daily 7)  Sucralfate 1 Gm Tabs (Sucralfate) .Marland Kitchen.. 1 tab po two times a day 8)  Oxybutynin Chloride 5 Mg Tabs (Oxybutynin chloride) .... Take 1 tablet by mouth two times a day 9)  Hydroxychloroquine Sulfate 200 Mg Tabs (Hydroxychloroquine sulfate) .Marland Kitchen.. 1 tab by mouth daily 10)  Hydrochlorothiazide 25 Mg Tabs (Hydrochlorothiazide) .Marland Kitchen.. 1 tab by mouth qam 11)  Macrobid 100 Mg Caps (Nitrofurantoin monohyd macro) .Marland Kitchen.. 1 capsule by mouth two times a day x 7 days  Patient Instructions: 1)  Restart Claritin everyday for spring allergies. 2)  Take Macrobid 2 x a day for UTI. 3)  Will call you with culture resuts in 2-3 days. Prescriptions: MACROBID 100 MG CAPS (NITROFURANTOIN MONOHYD MACRO) 1 capsule by mouth two times a day x 7 days  #14 x 0   Entered and Authorized by:   Loyal Gambler DO   Signed by:   Loyal Gambler DO on 04/01/2009   Method used:   Electronically to        [facility] (retail)       [address]       [place], [place]  [address]       Ph: [phone]       Fax: [phone]   RxID:   [id number]   Laboratory Results   Urine Tests    Routine Urinalysis   Color: yellow Appearance: Clear Glucose: negative   (Normal Range: Negative) Bilirubin: negative   (Normal Range: Negative) Ketone: negative   (Normal Range: Negative) Spec. Gravity: 1.015   (Normal Range: 1.003-1.035) Blood: negative   (Normal Range: Negative) pH: 5.0   (Normal Range:  5.0-8.0) Protein: 30   (Normal Range: Negative) Urobilinogen: 0.2   (Normal Range: 0-1) Nitrite: positive   (Normal Range: Negative) Leukocyte Esterace: small   (Normal Range: Negative)

## 2010-02-11 NOTE — Progress Notes (Signed)
Summary: BP and norvasc  Phone Note Call from Patient   Caller: Patient Summary of Call: FYI- Pt doesn't want to start Norvasc bc her home BP readings are normal. She is afraid that if she adds Norvasc her BP will be too low.  Initial call taken by: Sherlean Foot CMA,  Jun 03, 2009 1:40 PM  Follow-up for Phone Call        OK.  I agree.  I did read her note.  will watch her BP off the norvasc. Follow-up by: Loyal Gambler DO,  Jun 03, 2009 1:43 PM     Appended Document: BP and norvasc Pt aware

## 2010-02-11 NOTE — Letter (Signed)
Summary: [facility]   Imported By: Edmonia James 11/29/2009 08:45:52  _____________________________________________________________________  External Attachment:    Type:   Image     Comment:   External Document

## 2010-02-11 NOTE — Assessment & Plan Note (Signed)
Summary: f/u BP/ sugar   Vital Signs:  Patient profile:   77 year old female Height:      65 inches Weight:      200 pounds BMI:     33.40 O2 Sat:      98 % on Room air Pulse rate:   89 / minute BP sitting:   148 / 76  (left arm) Cuff size:   large  Vitals Entered By: Sherlean Foot CMA (May 16, 2009 8:57 AM)  O2 Flow:  Room air CC: F/U BP, weight and sugar   Primary Care Provider:  Loyal Gambler D.O.  CC:  F/U BP and weight and sugar.  History of Present Illness: 77 yo WF presents for f/u HTN.  She reports taking herself off Plaquenil this week after having muscle aches.  She has not told Dr Franki Monte yet.  Her home BPs are running 0000000 to [id number] systolic.  She denies any chest pain, SOB or leg swelling. She went back to the oncologist this week. She is set up to see the neuropathy that is possibly due to her chemotherapy.  Her AM fastings are home are in the 90s to 110s. She has done well w/ her diabetic diet.  She is on metformin 500 mg two times a day.  She has the dx of IFG.      Current Medications (verified): 1)  Metformin Hcl 500 Mg  Tabs (Metformin Hcl) .... Take 1 Tablet By Mouth Two Times A Day 2)  Caltrate 600+d 600-400 Mg-Unit  Tabs (Calcium Carbonate-Vitamin D) .... Take 1 Tablet By Mouth Two Times A Day 3)  Benazepril Hcl 40 Mg  Tabs (Benazepril Hcl) .Marland Kitchen.. 1 Tab By Mouth Daily 4)  Cymbalta 60 Mg Cpep (Duloxetine Hcl) .Marland Kitchen.. 1 Tab By Mouth Daily 5)  One Touch Ultra Test Strips .... Use Two Times A Day As Directed 6)  Super Omega-3 1000 Mg  Caps (Omega-3 Fatty Acids) .... Take Two By Mouth Two Times Daily 7)  Sucralfate 1 Gm  Tabs (Sucralfate) .Marland Kitchen.. 1 Tab Po Two Times A Day 8)  Oxybutynin Chloride 5 Mg Tabs (Oxybutynin Chloride) .... Take 1 Tablet By Mouth Two Times A Day 9)  Hydrochlorothiazide 25 Mg Tabs (Hydrochlorothiazide) .Marland Kitchen.. 1 Tab By Mouth Qam  Allergies (verified): 1)  ! Sulfa 2)  ! Codeine 3)  ! Zocor 4)  ! * Gabapentin  Review of Systems      See  HPI  Physical Exam  General:  alert, well-developed, well-nourished, and well-hydrated.  obese and using rolling walker Head:  normocephalic and atraumatic.   Mouth:  pharynx pink and moist.   Neck:  no masses.   Lungs:  Normal respiratory effort, chest expands symmetrically. Lungs are clear to auscultation, no crackles or wheezes. Heart:  RRR with 2/6 SEM Extremities:  no LE edema Skin:  reddish pink rash over R eyebrow with scarring alopecia and DLE over scalp Cervical Nodes:  No lymphadenopathy noted Psych:  good eye contact, not anxious appearing, and not depressed appearing.     Impression & Recommendations:  Problem # 1:  IMPAIRED FASTING GLUCOSE (ICD-790.21) Continue current meds, diabetic diet.  Update a 2 hr OGTT off metformin next month to see if she remains IFG vs T2DM. Her updated medication list for this problem includes:    Metformin Hcl 500 Mg Tabs (Metformin hcl) .Marland Kitchen... Take 1 tablet by mouth two times a day  Orders: Fingerstick [id number]) Glucose, (CBG) [id number]) T-Glucose Tolerance (GTT) Added  Samples [id number])  Problem # 2:  HYPERTENSION, BENIGN ESSENTIAL (ICD-401.1) BP high still.  Will add Norvasc and f/u in 3 mos.  Call if any problems. Her updated medication list for this problem includes:    Benazepril Hcl 40 Mg Tabs (Benazepril hcl) .Marland Kitchen... 1 tab by mouth daily    Hydrochlorothiazide 25 Mg Tabs (Hydrochlorothiazide) .Marland Kitchen... 1 tab by mouth qam    Norvasc 5 Mg Tabs (Amlodipine besylate) .Marland Kitchen... 1 tab by mouth daily  BP today: 148/76 Prior BP: 107/81 (04/01/2009)  Prior 10 Yr Risk Heart Disease: 17 % (05/11/2007)  Labs Reviewed: K+: 4.1 (12/14/2008) Creat: : 0.93 (12/14/2008)   Chol: 223 (12/14/2008)   HDL: 73 (12/14/2008)   LDL: 129 (12/14/2008)   TG: 106 (12/14/2008)  Problem # 3:  PERIPHERAL NEUROPATHY (ICD-356.9) She has an appt with neurology.  She was on gabapentin int he past and removed due to SEs. This may be chemo related.  Unlikely to be related  to DM or IFG since she has never had very high sugars.   Problem # 4:  LUPUS ERYTHEMATOSUS, DISCOID (ICD-695.4) She if off Plaquenil since it was not helping and causing adverse SEs.  She has f/u with Dr Franki Monte.  The rash on her face is unchanged and she has scarring alopecia today. The following medications were removed from the medication list:    Hydroxychloroquine Sulfate 200 Mg Tabs (Hydroxychloroquine sulfate) .Marland Kitchen... 1 tab by mouth daily  Complete Medication List: 1)  Metformin Hcl 500 Mg Tabs (Metformin hcl) .... Take 1 tablet by mouth two times a day 2)  Caltrate 600+d 600-400 Mg-unit Tabs (Calcium carbonate-vitamin d) .... Take 1 tablet by mouth two times a day 3)  Benazepril Hcl 40 Mg Tabs (Benazepril hcl) .Marland Kitchen.. 1 tab by mouth daily 4)  Cymbalta 60 Mg Cpep (Duloxetine hcl) .Marland Kitchen.. 1 tab by mouth daily 5)  One Touch Ultra Test Strips  .... Use two times a day as directed 6)  Super Omega-3 1000 Mg Caps (Omega-3 fatty acids) .... Take two by mouth two times daily 7)  Sucralfate 1 Gm Tabs (Sucralfate) .Marland Kitchen.. 1 tab po two times a day 8)  Oxybutynin Chloride 5 Mg Tabs (Oxybutynin chloride) .... Take 1 tablet by mouth two times a day 9)  Hydrochlorothiazide 25 Mg Tabs (Hydrochlorothiazide) .Marland Kitchen.. 1 tab by mouth qam 10)  Norvasc 5 Mg Tabs (Amlodipine besylate) .Marland Kitchen.. 1 tab by mouth daily  Patient Instructions: 1)  Start Norvasc 5 mg / day for high blood pressure in addition to what you are already taking. 2)  Have 2 hr oral glucose tolerance test done at the lab downstairs. 3)  Hold you Metformin for 2 days prior to the test. 4)  Return for f/u high Blood pressure/ pre-diabetes in 21mos. Prescriptions: NORVASC 5 MG TABS (AMLODIPINE BESYLATE) 1 tab by mouth daily  #30 x 2   Entered and Authorized by:   Loyal Gambler DO   Signed by:   Loyal Gambler DO on 05/16/2009   Method used:   Electronically to        [facility] (retail)       [address]       [place], [place]  [address]       Ph: [phone]        Fax: [phone]   RxID:   [id number]   Laboratory Results   Blood Tests     CBG Fasting:: 126mg /dL

## 2010-02-11 NOTE — Assessment & Plan Note (Signed)
Summary: F/U Burn   Vital Signs:  Patient profile:   77 year old female Height:      65 inches Weight:      202 pounds Pulse rate:   98 / minute BP sitting:   165 / 91  (right arm) Cuff size:   large  Vitals Entered By: Isaias Cowman CMA, Deborra Medina) (November 05, 2009 1:50 PM) CC: 1 week f/u 2nd degree burn on inner thighs   Primary Care Provider:  Loyal Gambler D.O.  CC:  1 week f/u 2nd degree burn on inner thighs.  History of Present Illness: 1 week f/u 2nd degree burn on inner thighs.  Dong well. Has been cleaning it with dial soap and applying a dressing with silvadene cream.  She feels it is healing really well. No fever or drainage.  Please see prior note form Dr. Valetta Close.   Current Medications (verified): 1)  Metformin Hcl 500 Mg  Tabs (Metformin Hcl) .... Take 1 Tablet By Mouth Two Times A Day 2)  Caltrate 600+d 600-400 Mg-Unit  Tabs (Calcium Carbonate-Vitamin D) .... Take 1 Tablet By Mouth Two Times A Day 3)  Benazepril Hcl 40 Mg  Tabs (Benazepril Hcl) .Marland Kitchen.. 1 Tab By Mouth Daily 4)  One Touch Ultra Test Strips .... Use Two Times A Day As Directed 5)  Super Omega-3 1000 Mg  Caps (Omega-3 Fatty Acids) .... Take Two By Mouth Two Times Daily 6)  Sucralfate 1 Gm  Tabs (Sucralfate) .Marland Kitchen.. 1 Tab Po Two Times A Day 7)  Oxybutynin Chloride 5 Mg Tabs (Oxybutynin Chloride) .... Take 1 Tablet By Mouth Two Times A Day 8)  Hydrochlorothiazide 25 Mg Tabs (Hydrochlorothiazide) .Marland Kitchen.. 1 Tab By Mouth Qam 9)  Tramadol Hcl 50 Mg Tabs (Tramadol Hcl) .Marland Kitchen.. 1 Tab By Mouth Two Times A Day As Needed Pain 10)  Norvasc 5 Mg Tabs (Amlodipine Besylate) .... 1/2 Tab By Mouth Daily 11)  Cymbalta 60 Mg Cpep (Duloxetine Hcl) .Marland Kitchen.. 1 Capsule By Mouth Daily 12)  Lorazepam 0.5 Mg Tabs (Lorazepam) .Marland Kitchen.. 1 Tab By Mouth Two Times A Day As Needed Anxiety 13)  Keflex 500 Mg Caps (Cephalexin) .Marland Kitchen.. 1 Capsule By Mouth Three Times A Day X 7 Days 14)  Silvadene 1 % Crea (Silver Sulfadiazine) .... Apply Cream To Burns Two Times A  Day  Allergies (verified): 1)  ! Sulfa 2)  ! Codeine 3)  ! Zocor 4)  ! * Gabapentin 5)  ! Asa 6)  ! Beta Blockers  Comments:  Nurse/Medical Assistant: The patient's medications and allergies were reviewed with the patient and were updated in the Medication and Allergy Lists. Isaias Cowman CMA, Deborra Medina) (November 05, 2009 1:51 PM)  Physical Exam  General:  Well-developed,well-nourished,in no acute distress; alert,appropriate and cooperative throughout examination Skin:  Left inner thigh with larger 2nd and 3rd dgreee burning. Wound appears clean. No acitve drainage. No sourround erythema thought she is getting some contact dermatitis from her tape she is using to hold the gauze.    Impression & Recommendations:  Problem # 1:  SECOND DEGREE BURN (ICD-949.2) Healing well. Continue the silvadene cream and bandage and cleaning well with dial soap. Has f/u with Dr. Christena Flake on Nov 4 in a week.   Complete Medication List: 1)  Metformin Hcl 500 Mg Tabs (Metformin hcl) .... Take 1 tablet by mouth two times a day 2)  Caltrate 600+d 600-400 Mg-unit Tabs (Calcium carbonate-vitamin d) .... Take 1 tablet by mouth two times a day 3)  Benazepril  Hcl 40 Mg Tabs (Benazepril hcl) .Marland Kitchen.. 1 tab by mouth daily 4)  One Touch Ultra Test Strips  .... Use two times a day as directed 5)  Super Omega-3 1000 Mg Caps (Omega-3 fatty acids) .... Take two by mouth two times daily 6)  Sucralfate 1 Gm Tabs (Sucralfate) .Marland Kitchen.. 1 tab po two times a day 7)  Oxybutynin Chloride 5 Mg Tabs (Oxybutynin chloride) .... Take 1 tablet by mouth two times a day 8)  Hydrochlorothiazide 25 Mg Tabs (Hydrochlorothiazide) .Marland Kitchen.. 1 tab by mouth qam 9)  Tramadol Hcl 50 Mg Tabs (Tramadol hcl) .Marland Kitchen.. 1 tab by mouth two times a day as needed pain 10)  Norvasc 5 Mg Tabs (Amlodipine besylate) .... 1/2 tab by mouth daily 11)  Cymbalta 60 Mg Cpep (Duloxetine hcl) .Marland Kitchen.. 1 capsule by mouth daily 12)  Lorazepam 0.5 Mg Tabs (Lorazepam) .Marland Kitchen.. 1 tab by  mouth two times a day as needed anxiety 13)  Keflex 500 Mg Caps (Cephalexin) .Marland Kitchen.. 1 capsule by mouth three times a day x 7 days 14)  Silvadene 1 % Crea (Silver sulfadiazine) .... Apply cream to burns two times a day   Orders Added: 1)  Est. Patient Level III [id number]

## 2010-02-11 NOTE — Assessment & Plan Note (Signed)
Summary: HTN   Vital Signs:  Patient profile:   77 year old female Height:      65 inches Weight:      197.75 pounds Pulse rate:   60 / minute BP sitting:   160 / 90  Vitals Entered By: Verdie Mosher (February 14, 2009 2:54 PM) CC: 6 wk followup   Primary Care Provider:  Loyal Gambler D.O.  CC:  6 wk followup.  History of Present Illness: [name] presents for f/u HTN.  [name] BPs are running 130s - 140s/  80s.  She feels great.  She is now on a full tab of benazepril.  She came off [name] HCTZ due to lows a few months ago.  She denies CP or DOE.  She is able to ambulate with [name] cane now.    Allergies: 1)  ! Sulfa 2)  ! Codeine 3)  ! Zocor 4)  ! * Gabapentin  Past History:  Past Medical History: Reviewed history from 12/03/2008 and no changes required. Depression Impaired fasting glucose Hyperlipidemia Hypertension heart murmur diabetic retinopathy- Dr Jodi Mourning OA; Lumbar DDD- Dr Sabino Gasser [facility] Neuroma - Dr Wardell Honour foot neuropathy - Dr Starleen Blue mild renal insufficiency normal adenosine CL 2003, Dr Geanie Logan ABIs 1.45 bilat 7-07 PUD Discoid Lupus  derm : Dr Jerrel Ivory R breast Cancer 11-09 Dr Georgiann Cocker onc, Dr Robina Ade gen surgery [facility] (Dr Domingo Cocking)--  GI:    Past Surgical History: Reviewed history from 12/22/2007 and no changes required. Hysterectomy+ 1 oophorectomy Total knee replacement- both- Dr Devona Konig Lumbar Laminectomy heart cath x 3 "normal" ([place]) cholecystectomy 8-07  R partial mastectomy with sentinel lymph node biopsy 11-09  Social History: Reviewed history from 11/29/2007 and no changes required. 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  widowed for 5 yrs has a cat lives in [place] appt family in area, 3 sons uses rolling walker and wheelchair  Review of Systems      See HPI  Physical Exam  General:  alert, well-developed, well-nourished, well-hydrated, and overweight-appearing.   Head:  normocephalic and atraumatic.   Neck:  no masses.    Lungs:  Normal respiratory effort, chest expands symmetrically. Lungs are clear to auscultation, no crackles or wheezes. Heart:  normal rate, regular rhythm, and 2/6 systolic murmur   Abdomen:  soft and non-tender.   Extremities:  No LE edema  Skin:  color normal.   Cervical Nodes:  No lymphadenopathy noted Psych:  Oriented X3, good eye contact, not anxious appearing, and not depressed appearing.     Impression & Recommendations:  Problem # 1:  HYPERTENSION, BENIGN ESSENTIAL (ICD-401.1) Still high.  Add HCTZ back on, starting with 1/2 tab by mouth daily.  She will montior [name] BPs at home and call me w/ results.  Labs are UTD. [name] updated medication list for this problem includes:    Benazepril Hcl 40 Mg Tabs (Benazepril hcl) .Marland Kitchen... 1 tab by mouth daily    Hydrochlorothiazide 25 Mg Tabs (Hydrochlorothiazide) .Marland Kitchen... 1 tab by mouth qam  BP today: 160/90 Prior BP: 163/83 (01/01/2009)  Prior 10 Yr Risk Heart Disease: 17 % (05/11/2007)  Labs Reviewed: K+: 4.1 (12/14/2008) Creat: : 0.93 (12/14/2008)   Chol: 223 (12/14/2008)   HDL: 73 (12/14/2008)   LDL: 129 (12/14/2008)   TG: 106 (12/14/2008)  Complete Medication List: 1)  Metformin Hcl 500 Mg Tabs (Metformin hcl) .... Take 1 tablet by mouth two times a day 2)  Caltrate 600+d 600-400 Mg-unit Tabs (Calcium carbonate-vitamin d) .Marland KitchenMarland KitchenMarland Kitchen  Take 1 tablet by mouth two times a day 3)  Benazepril Hcl 40 Mg Tabs (Benazepril hcl) .Marland Kitchen.. 1 tab by mouth daily 4)  Cymbalta 60 Mg Cpep (Duloxetine hcl) .Marland Kitchen.. 1 tab by mouth daily 5)  One Touch Ultra Test Strips  .... Use two times a day as directed 6)  Super Omega-3 1000 Mg Caps (Omega-3 fatty acids) .... Take two by mouth two times daily 7)  Sucralfate 1 Gm Tabs (Sucralfate) .Marland Kitchen.. 1 tab po two times a day 8)  Oxybutynin Chloride 5 Mg Tabs (Oxybutynin chloride) .... Take 1 tablet by mouth two times a day 9)  Hydroxychloroquine Sulfate 200 Mg Tabs (Hydroxychloroquine sulfate) .Marland Kitchen.. 1 tab by mouth daily 10)   Hydrochlorothiazide 25 Mg Tabs (Hydrochlorothiazide) .Marland Kitchen.. 1 tab by mouth qam  Patient Instructions: 1)  Start HCTZ 1/2 tab daily for BP. 2)  Monitor BPs at home.  Call if running too low (< 100/70). 3)  Work on [employer] and regular exercise.  Aim for 45 min 4 days/ wk. 4)  Return for f/u BP/ wt/ sugars in 3 mos. Prescriptions: HYDROCHLOROTHIAZIDE 25 MG TABS (HYDROCHLOROTHIAZIDE) 1 tab by mouth qAM  #30 x 2   Entered and Authorized by:   Loyal Gambler DO   Signed by:   Loyal Gambler DO on 02/14/2009   Method used:   Electronically to        [facility] (retail)       [address]       [place], [place]  [address]       Ph: [phone]       Fax: [phone]   RxID:   [id number]

## 2010-02-11 NOTE — Letter (Signed)
Summary: Depression Questionnaire  Depression Questionnaire   Imported By: Edmonia James 09/12/2009 08:37:22  _____________________________________________________________________  External Attachment:    Type:   Image     Comment:   External Document

## 2010-02-11 NOTE — Letter (Signed)
Summary: [facility]   Imported By: Edmonia James 02/13/2009 08:24:08  _____________________________________________________________________  External Attachment:    Type:   Image     Comment:   External Document

## 2010-02-11 NOTE — Letter (Signed)
Summary: [facility]   Imported By: Edmonia James 10/30/2009 12:27:50  _____________________________________________________________________  External Attachment:    Type:   Image     Comment:   External Document

## 2010-02-11 NOTE — Progress Notes (Signed)
Summary: CALL A NURSE  Phone Note From Other Clinic   Caller: CALL A NURSE Summary of Call: [facility] Triage Call Report Triage Record Num: [id number] Operator: Finis Bud Patient Name: [name] Call Date & Time: 08/31/2009 10:33:28AM Patient Phone: [phone] PCP: Patient Gender: Female PCP Fax : Patient DOB: 11-28-1932 Practice Name: [facility] Reason for Call: Pt calling to report that she is having anxiety after coming off of Cymbalta. Pt reports heaviness in her chest and coughing. Pt was seen ED 08/18. pt is calling to ask if she should go back on Cymbalta. Pt encouraged call office on Monday. Home care advice given for anxiety. No emergent s/s. Protocol(s) Used: Anxiety: Panic Recommended Outcome per Protocol: See Provider within 72 Hours Reason for Outcome: New or increasing symptoms AND not currently in treatment; not taking medications/therapy; or change in medication or therapy Care Advice: Distraction through talking with friends, listening to music, writing, going for a walk or ride can help relieve symptoms.  ~  ~ When speaking with provider, discuss symptoms or reasons for changing or not following treatment plan. Continue to follow your treatment plan. Take all medications as prescribed and keep all appointments with your provider.  ~  ~ Support from families and friends may help maintain a routine based on realistic expectations during stressful periods. Avoid the use of stimulants including caffeine (coffee, some soft drinks, some energy drinks, tea and chocolate), cocaine, and amphetamines. Also avoid drinking alcohol.  ~ After provider evaluation, get at least 30-60 minutes of moderate aerobic exercise, preferably each day; exercise 3 to 4 hours before you want to sleep. Moderate aerobic exercise is generally defined as requiring about as much energy as walking 2 miles in 30 minutes.  ~  ~ If have had counseling for the anxiety/panic  attacks, use those techniques now to help relieve the attack.  ~ Eat a balanced diet and follow a regular sleep schedule with adequate sleep, about 7 to 8 hou

## 2010-02-11 NOTE — Medication Information (Signed)
Summary: [facility]   Imported By: Edmonia James 03/01/2009 10:07:25  _____________________________________________________________________  External Attachment:    Type:   Image     Comment:   External Document

## 2010-02-11 NOTE — Assessment & Plan Note (Signed)
Summary: burn   Vital Signs:  Patient profile:   77 year old female Height:      65 inches Weight:      198 pounds BMI:     33.07 O2 Sat:      96 % on Room air Pulse rate:   100 / minute BP sitting:   147 / 104  (left arm) Cuff size:   large  Vitals Entered By: Sherlean Foot CMA (October 29, 2009 11:41 AM)  O2 Flow:  Room air CC: Dropped hot food on thighs Sunday night.    Primary Care Provider:  Loyal Gambler D.O.  CC:  Dropped hot food on thighs Sunday night. .  History of Present Illness: [name] presents for a burn on both anterior thighs that occured on Sunday (2 days ago) while she was removing a casserole from the oven.  It slid onto her lap while carrying it.  She says that the pain has actually improved but she has weaping and now streaking redness.  She is has IFG.  Denies fevers, chills or lightheadeness.    She is cleaning with cool compresses and covering it.    Allergies: 1)  ! Sulfa 2)  ! Codeine 3)  ! Zocor 4)  ! * Gabapentin 5)  ! Asa 6)  ! Beta Blockers  Past History:  Past Medical History: Reviewed history from 09/04/2009 and no changes required. Depression/ Anxiety Impaired fasting glucose Hyperlipidemia Hypertension heart murmur diabetic retinopathy- Dr Jodi Mourning OA; Lumbar DDD- Dr Sabino Gasser [facility] Neuroma - Dr Wardell Honour foot neuropathy - Dr Starleen Blue mild renal insufficiency normal adenosine CL 2003, Dr Geanie Logan ABIs 1.45 bilat 7-07 PUD Discoid Lupus  derm : Dr Jerrel Ivory R breast Cancer 11-09 Dr Georgiann Cocker onc, Dr Robina Ade gen surgery [facility] (Dr Domingo Cocking)--  GI:    Past Surgical History: Reviewed history from 12/22/2007 and no changes required. Hysterectomy+ 1 oophorectomy Total knee replacement- both- Dr Devona Konig Lumbar Laminectomy heart cath x 3 "normal" ([place]) cholecystectomy 8-07  R partial mastectomy with sentinel lymph node biopsy 11-09  Social History: Reviewed history from 09/04/2009 and no changes required. 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  widowed for 7 yrs has a cat lives in [place] appt family in area, 3 sons uses rolling walker and wheelchair  Review of Systems      See HPI  Physical Exam  General:  alert, well-developed, well-nourished, and well-hydrated.   Skin:  2nd degree circular shaped burn  ~14 cm over the L anterior thigh and a smaller  ~5 cm 2nd degree burn over the R anterior thigh with serosangious drainage, a clean (non necrotic base) with surrounding streaking erythema and warmth.   Impression & Recommendations:  Problem # 1:  SECOND DEGREE BURN (ICD-949.2) Assessment New TD vaccine given.  Using Tramadol for pain.  Clean with antbacterial soap and water and cover with Silvadene cream two times a day and non stick gauze.  Will start her on Keflex to cover for secondary cellulitis given streaking erythema.  She is to check her sugars regularly.  Schedule a wound care visit with Dr Sharol Given to check and f/u in 1 wk. Call if pain, redness worsens or fever occurs. Orders: [place] Referral (Wound Care)  Complete Medication List: 1)  Metformin Hcl 500 Mg Tabs (Metformin hcl) .... Take 1 tablet by mouth two times a day 2)  Caltrate 600+d 600-400 Mg-unit Tabs (Calcium carbonate-vitamin d) .... Take 1 tablet by mouth two  times a day 3)  Benazepril Hcl 40 Mg Tabs (Benazepril hcl) .Marland Kitchen.. 1 tab by mouth daily 4)  One Touch Ultra Test Strips  .... Use two times a day as directed 5)  Super Omega-3 1000 Mg Caps (Omega-3 fatty acids) .... Take two by mouth two times daily 6)  Sucralfate 1 Gm Tabs (Sucralfate) .Marland Kitchen.. 1 tab po two times a day 7)  Oxybutynin Chloride 5 Mg Tabs (Oxybutynin chloride) .... Take 1 tablet by mouth two times a day 8)  Hydrochlorothiazide 25 Mg Tabs (Hydrochlorothiazide) .Marland Kitchen.. 1 tab by mouth qam 9)  Tramadol Hcl 50 Mg Tabs (Tramadol hcl) .Marland Kitchen.. 1 tab by mouth two times a day as needed pain 10)  Norvasc 5 Mg Tabs (Amlodipine besylate) .... 1/2 tab by mouth daily 11)   Cymbalta 60 Mg Cpep (Duloxetine hcl) .Marland Kitchen.. 1 capsule by mouth daily 12)  Lorazepam 0.5 Mg Tabs (Lorazepam) .Marland Kitchen.. 1 tab by mouth two times a day as needed anxiety 13)  Keflex 500 Mg Caps (Cephalexin) .Marland Kitchen.. 1 capsule by mouth three times a day x 7 days 14)  Silvadene 1 % Crea (Silver sulfadiazine) .... Apply cream to burns two times a day  Other Orders: TD Toxoids IM 7 YR + [id number]) Admin 1st Vaccine [id number])  Patient Instructions: 1)  Start Keflex today and take for 7 days. 2)  Use Tramadol as needed for pain. 3)  Clean gently with antibacterial soap and cool water 1-2 x a day.  4)  Redress with Silvadene cream and non stick gauze 2 x a day. 5)  Will get you in with Dr Sharol Given for wound care. 6)  Call me if you are feeling worse. 7)  Return for follow up in 1 wk. Prescriptions: SILVADENE 1 % CREA (SILVER SULFADIAZINE) apply cream to burns two times a day  #1 tube x 0   Entered and Authorized by:   Loyal Gambler DO   Signed by:   Loyal Gambler DO on 10/29/2009   Method used:   Electronically to        [facility] (retail)       [address]       [place], [place]  [address]       Ph: [phone]       Fax: [phone]   RxID:   [id number] KEFLEX 500 MG CAPS (CEPHALEXIN) 1 capsule by mouth three times a day x 7 days  #21 x 0   Entered and Authorized by:   Loyal Gambler DO   Signed by:   Loyal Gambler DO on 10/29/2009   Method used:   Electronically to        [facility] (retail)       [address]       [place], [place]  [address]       Ph: [phone]       Fax: [phone]   RxID:   [id number]    Orders Added: 1)  [facility] [place] Referral [Wound Care] 2)  TD Toxoids IM 7 YR + [90714] 3)  Admin 1st Vaccine [90471] 4)  Est. Patient Level III [id number]   Immunizations Administered:  Tetanus Vaccine:    Vaccine Type: Td    Site: left deltoid    Dose: 0.5 ml    Route: IM    Given by: Sherlean Foot CMA    Exp. Date: 02/20/2010    Lot #: [id number]    VIS given: 11/30/07 version  given October 29, 2009.   Immunizations Administered:  Tetanus Vaccine:    Vaccine Type: Td    Site: left deltoid    Dose: 0.5 ml    Route: IM    Given by: Sherlean Foot CMA    Exp. Date: 02/20/2010    Lot #: [id number]    VIS given: 11/30/07 version given October 29, 2009.

## 2010-02-11 NOTE — Assessment & Plan Note (Signed)
Summary: UA//mpm  Nurse Visit   Vitals Entered By: Sherlean Foot CMA (April 12, 2009 10:55 AM)  Allergies: 1)  ! Sulfa 2)  ! Codeine 3)  ! Zocor 4)  ! * Gabapentin Laboratory Results   Urine Tests    Routine Urinalysis   Color: yellow Appearance: Clear Glucose: negative   (Normal Range: Negative) Bilirubin: negative   (Normal Range: Negative) Ketone: negative   (Normal Range: Negative) Spec. Gravity: 1.015   (Normal Range: 1.003-1.035) Blood: negative   (Normal Range: Negative) pH: 6.0   (Normal Range: 5.0-8.0) Protein: trace   (Normal Range: Negative) Urobilinogen: 0.2   (Normal Range: 0-1) Nitrite: positive   (Normal Range: Negative) Leukocyte Esterace: small   (Normal Range: Negative)       Orders Added: 1)  UA Dipstick w/o Micro (automated)  [81003] 2)  T-Culture, Urine [id number] 3)  Est. Patient Level I [id number] Prescriptions: KEFLEX 500 MG CAPS (CEPHALEXIN) 1 capsule by mouth three times a day x 5 days  #15 x 0   Entered and Authorized by:   Loyal Gambler DO   Signed by:   Loyal Gambler DO on 04/12/2009   Method used:   Electronically to        [facility] (retail)       [address]       [place], [place]  [address]       Ph: [phone]       Fax: [phone]   RxID:   [id number]   Pt not any better. Still have same Sxs.   Impression & Recommendations:  Problem # 1:  UTI (ICD-599.0) UA still + after macrobid and mixed species on cx.  RE-cx today and f/u results on Monday.  Add Keflex x 5 days to treat UTI today.  Will get her in with urology for recurring UTI.  Her updated medication list for this problem includes:    Oxybutynin Chloride 5 Mg Tabs (Oxybutynin chloride) .Marland Kitchen... Take 1 tablet by mouth two times a day    Keflex 500 Mg Caps (Cephalexin) .Marland Kitchen... 1 capsule by mouth three times a day x 5 days  Orders: UA Dipstick w/o Micro (automated)  (81003) T-Culture, Urine [id number])  Complete Medication List: 1)  Metformin Hcl 500 Mg Tabs  (Metformin hcl) .... Take 1 tablet by mouth two times a day 2)  Caltrate 600+d 600-400 Mg-unit Tabs (Calcium carbonate-vitamin d) .... Take 1 tablet by mouth two times a day 3)  Benazepril Hcl 40 Mg Tabs (Benazepril hcl) .Marland Kitchen.. 1 tab by mouth daily 4)  Cymbalta 60 Mg Cpep (Duloxetine hcl) .Marland Kitchen.. 1 tab by mouth daily 5)  One Touch Ultra Test Strips  .... Use two times a day as directed 6)  Super Omega-3 1000 Mg Caps (Omega-3 fatty acids) .... Take two by mouth two times daily 7)  Sucralfate 1 Gm Tabs (Sucralfate) .Marland Kitchen.. 1 tab po two times a day 8)  Oxybutynin Chloride 5 Mg Tabs (Oxybutynin chloride) .... Take 1 tablet by mouth two times a day 9)  Hydroxychloroquine Sulfate 200 Mg Tabs (Hydroxychloroquine sulfate) .Marland Kitchen.. 1 tab by mouth daily 10)  Hydrochlorothiazide 25 Mg Tabs (Hydrochlorothiazide) .Marland Kitchen.. 1 tab by mouth qam 11)  Keflex 500 Mg Caps (Cephalexin) .Marland Kitchen.. 1 capsule by mouth three times a day x 5 days   Appended Document: UA//mpm Sharyn Lull, Pls ask pt if she has a urologist.  She needs to see them for recurrent UTI.  Loyal Gambler, D.O.  Appended Document: UA//mpm Pt does not  have a urologist. She will wait for culture to come back.

## 2010-02-11 NOTE — Letter (Signed)
Summary: [facility]  [facility]   Imported By: Edmonia James 09/06/2009 12:47:46  _____________________________________________________________________  External Attachment:    Type:   Image     Comment:   External Document

## 2010-02-11 NOTE — Assessment & Plan Note (Signed)
Summary: 3 mo. f/u on BP & Pre-Diabetes-   Vital Signs:  Patient profile:   77 year old female Height:      65 inches Weight:      200 pounds BMI:     33.40 O2 Sat:      95 % on Room air Pulse rate:   108 / minute BP sitting:   142 / 88  (left arm) Cuff size:   large  Vitals Entered By: Sherlean Foot CMA (August 13, 2009 9:53 AM)  O2 Flow:  Room air CC: F/U   Primary Care Provider:  Loyal Gambler D.O.  CC:  F/U.  History of Present Illness: 77 yo WF presents for f/u IFG and HTN.  She has neuropathy and her neurologist started her on a topical cream.  She is bothered by arthritis pain in her hands and her back.  She is doing water aerobics and reports that Tylenol arthritis is not helping.  She used to be on Darvocet.  Her mood has much improved and she wants to wean off the Cymbalta.   She is seeing Dr Franki Monte for her discoid lupus.  She is off all meds after having problems with plaquenil.  She had a 2 hr OGTT which showed IFG in May.  She is on Metformin.   Her home BPs are running 130s/ 70s.   Her home sugars are running in the low 100s.    Current Medications (verified): 1)  Metformin Hcl 500 Mg  Tabs (Metformin Hcl) .... Take 1 Tablet By Mouth Two Times A Day 2)  Caltrate 600+d 600-400 Mg-Unit  Tabs (Calcium Carbonate-Vitamin D) .... Take 1 Tablet By Mouth Two Times A Day 3)  Benazepril Hcl 40 Mg  Tabs (Benazepril Hcl) .Marland Kitchen.. 1 Tab By Mouth Daily 4)  Cymbalta 60 Mg Cpep (Duloxetine Hcl) .Marland Kitchen.. 1 Tab By Mouth Daily 5)  One Touch Ultra Test Strips .... Use Two Times A Day As Directed 6)  Super Omega-3 1000 Mg  Caps (Omega-3 Fatty Acids) .... Take Two By Mouth Two Times Daily 7)  Sucralfate 1 Gm  Tabs (Sucralfate) .Marland Kitchen.. 1 Tab Po Two Times A Day 8)  Oxybutynin Chloride 5 Mg Tabs (Oxybutynin Chloride) .... Take 1 Tablet By Mouth Two Times A Day 9)  Hydrochlorothiazide 25 Mg Tabs (Hydrochlorothiazide) .Marland Kitchen.. 1 Tab By Mouth Qam  Allergies (verified): 1)  ! Sulfa 2)  ! Codeine 3)   ! Zocor 4)  ! * Gabapentin  Past History:  Past Medical History: Reviewed history from 12/03/2008 and no changes required. Depression Impaired fasting glucose Hyperlipidemia Hypertension heart murmur diabetic retinopathy- Dr Jodi Mourning OA; Lumbar DDD- Dr Sabino Gasser [facility] Neuroma - Dr Wardell Honour foot neuropathy - Dr Starleen Blue mild renal insufficiency normal adenosine CL 2003, Dr Geanie Logan ABIs 1.45 bilat 7-07 PUD Discoid Lupus  derm : Dr Jerrel Ivory R breast Cancer 11-09 Dr Georgiann Cocker onc, Dr Robina Ade gen surgery [facility] (Dr Domingo Cocking)--  GI:    Social History: Reviewed history from 11/29/2007 and no changes required. 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  widowed for 5 yrs has a cat lives in [place] appt family in area, 3 sons uses rolling walker and wheelchair  Review of Systems      See HPI  Physical Exam  General:  alert, well-developed, well-nourished, and well-hydrated.  obese and using cane Head:  normocephalic and atraumatic.   Eyes:  conjunctiva clear Mouth:  pharynx pink and moist.   Neck:  no masses.   Lungs:  Normal respiratory effort, chest expands symmetrically. Lungs are clear to auscultation, no crackles or wheezes. Heart:  RRR with 2/6 SEM Msk:  neg finkelstein test bilat Extremities:  no LE edema Skin:  improvement in discoid lupus on face and scalp with some mild scaring alopecia Cervical Nodes:  No lymphadenopathy noted Psych:  good eye contact, not anxious appearing, and not depressed appearing.     Impression & Recommendations:  Problem # 1:  HYPERTENSION, BENIGN ESSENTIAL (ICD-401.1) BP remains high and even at home, she is running SBPs of 130s.  She was reluctant to add Amlodopine 5 mg at her last visit.  Will have her start on just 1/2 tab daily in addition to her other meds.   Her updated medication list for this problem includes:    Benazepril Hcl 40 Mg Tabs (Benazepril hcl) .Marland Kitchen... 1 tab by mouth daily    Hydrochlorothiazide 25 Mg Tabs  (Hydrochlorothiazide) .Marland Kitchen... 1 tab by mouth qam    Norvasc 5 Mg Tabs (Amlodipine besylate) .Marland Kitchen... 1/2 tab by mouth daily  BP today: 142/88 Prior BP: 148/76 (05/16/2009)  Prior 10 Yr Risk Heart Disease: 17 % (05/11/2007)  Labs Reviewed: K+: 4.1 (12/14/2008) Creat: : 0.93 (12/14/2008)   Chol: 223 (12/14/2008)   HDL: 73 (12/14/2008)   LDL: 129 (12/14/2008)   TG: 106 (12/14/2008)  Problem # 2:  IMPAIRED FASTING GLUCOSE (ICD-790.21) Home sugar readings OK.  2 hr OGTT in May confirmed IFG and not T2DM.  She is to stay on Metformin and work on her diabetic diet.  Her exercise is lmited due to arthritis pain in her back and neuropathy.  Adding Tramadol for her back pain.  Continue water aerobics and is seeing neuro for her neuropathy. Her updated medication list for this problem includes:    Metformin Hcl 500 Mg Tabs (Metformin hcl) .Marland Kitchen... Take 1 tablet by mouth two times a day  Problem # 3:  PERIPHERAL NEUROPATHY (ICD-356.9) Unchanged. Seeing Neuro.  Not on treatment.  Problem # 4:  DEPRESSION (ICD-311) Assessment: Improved Mood has improved following treatment of breast cancer.  Will wean her off Cymbalta over 2 wks.  Will call me if mood worsens off meds.   The following medications were removed from the medication list:    Cymbalta 60 Mg Cpep (Duloxetine hcl) .Marland Kitchen... 1 tab by mouth daily  Complete Medication List: 1)  Metformin Hcl 500 Mg Tabs (Metformin hcl) .... Take 1 tablet by mouth two times a day 2)  Caltrate 600+d 600-400 Mg-unit Tabs (Calcium carbonate-vitamin d) .... Take 1 tablet by mouth two times a day 3)  Benazepril Hcl 40 Mg Tabs (Benazepril hcl) .Marland Kitchen.. 1 tab by mouth daily 4)  One Touch Ultra Test Strips  .... Use two times a day as directed 5)  Super Omega-3 1000 Mg Caps (Omega-3 fatty acids) .... Take two by mouth two times daily 6)  Sucralfate 1 Gm Tabs (Sucralfate) .Marland Kitchen.. 1 tab po two times a day 7)  Oxybutynin Chloride 5 Mg Tabs (Oxybutynin chloride) .... Take 1 tablet by  mouth two times a day 8)  Hydrochlorothiazide 25 Mg Tabs (Hydrochlorothiazide) .Marland Kitchen.. 1 tab by mouth qam 9)  Tramadol Hcl 50 Mg Tabs (Tramadol hcl) .Marland Kitchen.. 1 tab by mouth two times a day as needed pain 10)  Norvasc 5 Mg Tabs (Amlodipine besylate) .... 1/2 tab by mouth daily  Patient Instructions: 1)  Taper off Cymbalta by taking 30 mg once a day x 1 wk then 1 capsule every OTHER day for  1 wk then Stop. 2)  Once OFF of Cymbalta, can use Tramadol as needed for pain. 3)  Start on 1/2 tab of Norvasc once daily for HTN. 4)  Goal BP is 100-130/60-80. 5)  Work on diabetic diet, water aerobics. 6)  Return for follow up/ flu shot in 3 mos. Prescriptions: NORVASC 5 MG TABS (AMLODIPINE BESYLATE) 1/2 tab by mouth daily  #30 x 2   Entered and Authorized by:   Loyal Gambler DO   Signed by:   Loyal Gambler DO on 08/13/2009   Method used:   Electronically to        [facility] (retail)       [address]       [place], [place]  [address]       Ph: [phone]       Fax: [phone]   RxID:   [id number] TRAMADOL HCL 50 MG TABS (TRAMADOL HCL) 1 tab by mouth two times a day as needed pain  #60 x 1   Entered and Authorized by:   Loyal Gambler DO   Signed by:   Loyal Gambler DO on 08/13/2009   Method used:   Electronically to        [facility] (retail)       [address]       [place], [place]  [address]       Ph: [phone]       Fax: [phone]   RxID:   [id number]

## 2010-02-11 NOTE — Assessment & Plan Note (Signed)
Summary: HFU anxiety   Vital Signs:  Patient profile:   77 year old female Height:      65 inches Weight:      196 pounds BMI:     32.73 O2 Sat:      100 % on Room air Pulse rate:   112 / minute BP sitting:   142 / 93  (left arm) Cuff size:   large  Vitals Entered By: Sherlean Foot CMA (September 04, 2009 11:04 AM)  O2 Flow:  Room air CC: Hosp f/u.    Primary Care Provider:  Loyal Gambler D.O.  CC:  Hosp f/u. Marland Kitchen  History of Present Illness: 77 yo WF presents for HFU visit.    Her hosptial discharge summary is in scanning.  She was admitted via EMS to [facility] 2 wks ago for chest tightness.  She was ruled out for an AMI.  She is supposed to f/u with Dr Paulla Fore for a stress test but has yet to schedule this.  She summs up what she was told by stating that they said it was her anxiety.  She did recently stop her Cymbalta and feels like she needs to get back on it.  She is feeling nervous but is not sure why.  Denies any acute stressors and has closeby family support.  She has had increased trouble getting to sleep.  She did not change any of her other meds.  Has ongoing continual chest tightness still but no SOB or heart palpitations.  Current Medications (verified): 1)  Metformin Hcl 500 Mg  Tabs (Metformin Hcl) .... Take 1 Tablet By Mouth Two Times A Day 2)  Caltrate 600+d 600-400 Mg-Unit  Tabs (Calcium Carbonate-Vitamin D) .... Take 1 Tablet By Mouth Two Times A Day 3)  Benazepril Hcl 40 Mg  Tabs (Benazepril Hcl) .Marland Kitchen.. 1 Tab By Mouth Daily 4)  One Touch Ultra Test Strips .... Use Two Times A Day As Directed 5)  Super Omega-3 1000 Mg  Caps (Omega-3 Fatty Acids) .... Take Two By Mouth Two Times Daily 6)  Sucralfate 1 Gm  Tabs (Sucralfate) .Marland Kitchen.. 1 Tab Po Two Times A Day 7)  Oxybutynin Chloride 5 Mg Tabs (Oxybutynin Chloride) .... Take 1 Tablet By Mouth Two Times A Day 8)  Hydrochlorothiazide 25 Mg Tabs (Hydrochlorothiazide) .Marland Kitchen.. 1 Tab By Mouth Qam 9)  Tramadol Hcl 50  Mg Tabs (Tramadol Hcl) .Marland Kitchen.. 1 Tab By Mouth Two Times A Day As Needed Pain 10)  Norvasc 5 Mg Tabs (Amlodipine Besylate) .... 1/2 Tab By Mouth Daily 11)  Cymbalta 30 Mg Cpep (Duloxetine Hcl) .... Take 1 Cap By Mouth Once Daily  Allergies (verified): 1)  ! Sulfa 2)  ! Codeine 3)  ! Zocor 4)  ! * Gabapentin 5)  ! Asa 6)  ! Beta Blockers  Past History:  Past Medical History: Depression/ Anxiety Impaired fasting glucose Hyperlipidemia Hypertension heart murmur diabetic retinopathy- Dr Jodi Mourning OA; Lumbar DDD- Dr Sabino Gasser [facility] Neuroma - Dr Wardell Honour foot neuropathy - Dr Starleen Blue mild renal insufficiency normal adenosine CL 2003, Dr Geanie Logan ABIs 1.45 bilat 7-07 PUD Discoid Lupus  derm : Dr Jerrel Ivory R breast Cancer 11-09 Dr Georgiann Cocker onc, Dr Robina Ade gen surgery [facility] (Dr Domingo Cocking)--  GI:    Past Surgical History: Reviewed history from 12/22/2007 and no changes required. Hysterectomy+ 1 oophorectomy Total knee replacement- both- Dr Devona Konig Lumbar Laminectomy heart cath x 3 "normal" ([place]) cholecystectomy 8-07  R partial mastectomy with sentinel lymph node  biopsy 11-09  Social History: Reviewed history from 11/29/2007 and no changes required. 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  obese and using rolling walker Head:  normocephalic and atraumatic.   Mouth:  pharynx pink and moist.   Neck:  no masses.   Lungs:  Normal respiratory effort, chest expands symmetrically. Lungs are clear to auscultation, no crackles or wheezes. Heart:  no murmur and tachycardia.   Abdomen:  soft and non-tender.   Extremities:  no LE edema Skin:  color normal.   Cervical Nodes:  No lymphadenopathy noted Psych:  good eye contact, not anxious appearing, and not  depressed appearing.     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  Will restart Cymbalta, 30 mg x 1 wk then increase to 60 mg/  Can use Lorazepam for as needed current anxiety symptoms until Cymbalta kicks in (about 3 wks).  Call if ANY problems.  Has f/u with me next month. Her updated medication list for this problem includes:    Cymbalta 60 Mg Cpep (Duloxetine hcl) .Marland Kitchen... 1 capsule by mouth daily    Lorazepam 0.5 Mg Tabs (Lorazepam) .Marland Kitchen... 1 tab by mouth two times a day as needed anxiety  Problem # 2:  HYPERTENSION, BENIGN ESSENTIAL (ICD-401.1) BPs at home are at goal.  Continue current meds.  She has a nuclear stress test set up with Dr Paulla Fore to complete. Her updated medication list for this problem includes:    Benazepril Hcl 40 Mg Tabs (Benazepril hcl) .Marland Kitchen... 1 tab by mouth daily    Hydrochlorothiazide 25 Mg Tabs (Hydrochlorothiazide) .Marland Kitchen... 1 tab by mouth qam    Norvasc 5 Mg Tabs (Amlodipine besylate) .Marland Kitchen... 1/2 tab by mouth daily  BP today: 142/93 Prior BP: 142/88 (08/13/2009)  Prior 10 Yr Risk Heart Disease: 17 % (05/11/2007)  Labs Reviewed: K+: 4.1 (12/14/2008) Creat: : 0.93 (12/14/2008)   Chol: 223 (12/14/2008)   HDL: 73 (12/14/2008)   LDL: 129 (12/14/2008)   TG: 106 (12/14/2008)  Complete Medication List: 1)  Metformin Hcl 500 Mg Tabs (Metformin hcl) .... Take 1 tablet by mouth two times a day 2)  Caltrate 600+d 600-400 Mg-unit Tabs (Calcium carbonate-vitamin d) .... Take 1 tablet by mouth two times a day 3)  Benazepril Hcl 40 Mg Tabs (Benazepril hcl) .Marland Kitchen.. 1 tab by mouth daily 4)  One Touch Ultra Test Strips  .... Use two times a day as directed 5)  Super Omega-3 1000 Mg Caps (Omega-3 fatty acids) .... Take two by mouth two times daily 6)  Sucralfate 1 Gm Tabs (Sucralfate) .Marland Kitchen.. 1 tab po two times a day 7)  Oxybutynin Chloride 5 Mg Tabs (Oxybutynin chloride) .... Take 1 tablet by  mouth two times a day 8)  Hydrochlorothiazide 25 Mg Tabs (Hydrochlorothiazide) .Marland Kitchen.. 1 tab by mouth qam 9)  Tramadol Hcl 50 Mg Tabs (Tramadol hcl) .Marland Kitchen.. 1 tab by mouth two times a day as needed pain 10)  Norvasc 5 Mg Tabs (Amlodipine besylate) .... 1/2 tab by mouth daily 11)  Cymbalta 60 Mg Cpep (Duloxetine hcl) .Marland Kitchen.. 1 capsule by mouth daily 12)  Lorazepam 0.5 Mg Tabs (Lorazepam) .Marland Kitchen.. 1 tab by mouth two times  a day as needed anxiety  Patient Instructions: 1)  Stay on current meds. 2)  Finish out Cymbalta 30s then go up to 60s. 3)  Use Lorazepam AS NEEDED for anxiety. 4)  This might make you sleepy. Prescriptions: LORAZEPAM 0.5 MG TABS (LORAZEPAM) 1 tab by mouth two times a day as needed anxiety  #30 x 0   Entered and Authorized by:   Loyal Gambler DO   Signed by:   Loyal Gambler DO on 09/04/2009   Method used:   Printed then faxed to ...       [facility] (retail)       [place], [place]  [address]       Ph: [phone]       Fax: [phone]   RxID:   [id number] CYMBALTA 60 MG CPEP (DULOXETINE HCL) 1 capsule by mouth daily  #30 x 3   Entered and Authorized by:   Loyal Gambler DO   Signed by:   Loyal Gambler DO on 09/04/2009   Method used:   Electronically to        [facility] (retail)       [address]       [place], [place]  [address]       Ph: [phone]       Fax: [phone]   RxID:   [id number]

## 2010-02-15 ENCOUNTER — Encounter: Payer: Self-pay | Admitting: Family Medicine

## 2010-02-17 ENCOUNTER — Telehealth: Payer: Self-pay | Admitting: Family Medicine

## 2010-02-18 ENCOUNTER — Ambulatory Visit (INDEPENDENT_AMBULATORY_CARE_PROVIDER_SITE_OTHER): Payer: Self-pay

## 2010-02-18 ENCOUNTER — Ambulatory Visit: Payer: Self-pay | Admitting: Family Medicine

## 2010-02-18 ENCOUNTER — Encounter: Payer: Self-pay | Admitting: Family Medicine

## 2010-02-18 DIAGNOSIS — N390 Urinary tract infection, site not specified: Secondary | ICD-10-CM

## 2010-02-18 LAB — CONVERTED CEMR LAB
Bilirubin Urine: NEGATIVE
Blood in Urine, dipstick: NEGATIVE
Protein, U semiquant: 30
Urobilinogen, UA: 0.2

## 2010-02-19 ENCOUNTER — Encounter: Payer: Self-pay | Admitting: Family Medicine

## 2010-02-20 ENCOUNTER — Ambulatory Visit: Payer: Self-pay | Admitting: Family Medicine

## 2010-02-24 ENCOUNTER — Encounter: Payer: Self-pay | Admitting: Family Medicine

## 2010-02-26 ENCOUNTER — Encounter: Payer: Self-pay | Admitting: Family Medicine

## 2010-02-27 NOTE — Assessment & Plan Note (Signed)
Summary: dysuria   Vitals Entered By: Sherlean Foot CMA (February 18, 2010 1:39 PM)  Allergies: 1)  ! Sulfa 2)  ! Codeine 3)  ! Zocor 4)  ! * Gabapentin 5)  ! Asa 6)  ! Beta Blockers   Impression & Recommendations:  Problem # 1:  DYSURIA (ICD-788.1) UA inconclusive.  Sent for cx.  Will treat with abx only if +. Her updated medication list for this problem includes:    Oxybutynin Chloride 5 Mg Tabs (Oxybutynin chloride) .Marland Kitchen... Take 1 tablet by mouth two times a day  Complete Medication List: 1)  Metformin Hcl 500 Mg Tabs (Metformin hcl) .... Take 1 tablet by mouth two times a day 2)  Caltrate 600+d 600-400 Mg-unit Tabs (Calcium carbonate-vitamin d) .... Take 1 tablet by mouth two times a day 3)  Benazepril Hcl 40 Mg Tabs (Benazepril hcl) .Marland Kitchen.. 1 tab by mouth daily 4)  One Touch Ultra Test Strips  .... Use two times a day as directed 5)  Super Omega-3 1000 Mg Caps (Omega-3 fatty acids) .... Take two by mouth two times daily 6)  Sucralfate 1 Gm Tabs (Sucralfate) .Marland Kitchen.. 1 tab po two times a day 7)  Oxybutynin Chloride 5 Mg Tabs (Oxybutynin chloride) .... Take 1 tablet by mouth two times a day 8)  Hydrochlorothiazide 25 Mg Tabs (Hydrochlorothiazide) .Marland Kitchen.. 1 tab by mouth qam 9)  Tramadol Hcl 50 Mg Tabs (Tramadol hcl) .Marland Kitchen.. 1 tab by mouth two times a day as needed pain 10)  Norvasc 5 Mg Tabs (Amlodipine besylate) .... 1/2 tab by mouth daily 11)  Cymbalta 60 Mg Cpep (Duloxetine hcl) .Marland Kitchen.. 1 capsule by mouth daily 12)  Lorazepam 0.5 Mg Tabs (Lorazepam) .Marland Kitchen.. 1 tab by mouth two times a day as needed anxiety  Other Orders: UA Dipstick w/o Micro (automated)  (81003) T-Culture, Urine [id number])   Orders Added: 1)  UA Dipstick w/o Micro (automated)  [81003] 2)  T-Culture, Urine [id number]    Laboratory Results   Urine Tests    Routine Urinalysis   Color: yellow Appearance: Clear Glucose: negative   (Normal Range: Negative) Bilirubin: negative   (Normal Range:  Negative) Ketone: negative   (Normal Range: Negative) Spec. Gravity: 1.025   (Normal Range: 1.003-1.035) Blood: negative   (Normal Range: Negative) pH: 5.5   (Normal Range: 5.0-8.0) Protein: 30   (Normal Range: Negative) Urobilinogen: 0.2   (Normal Range: 0-1) Nitrite: negative   (Normal Range: Negative) Leukocyte Esterace: trace   (Normal Range: Negative)    Comments: C/o dysuria.      Appended Document: dysuria Pt aware of the above

## 2010-02-27 NOTE — Progress Notes (Signed)
  Phone Note Call from Patient   Caller: Patient Summary of Call: pt called and states she has a UTI and cannot come out of the house this week because she just came home from the hospital and wants abx called in Initial call taken by: Isaias Cowman CMA, Deborra Medina),  February 17, 2010 11:24 AM  Follow-up for Phone Call        called pt and notified her that she needs to come in for a nurse visit to confirm uti and we cant cal lin abx without confirming this.Pt voiced understanding  Follow-up by: Isaias Cowman CMA, Deborra Medina),  February 17, 2010 11:24 AM

## 2010-02-28 ENCOUNTER — Encounter: Payer: Self-pay | Admitting: Family Medicine

## 2010-02-28 ENCOUNTER — Ambulatory Visit (INDEPENDENT_AMBULATORY_CARE_PROVIDER_SITE_OTHER): Payer: MEDICARE | Admitting: Family Medicine

## 2010-02-28 DIAGNOSIS — I4891 Unspecified atrial fibrillation: Secondary | ICD-10-CM | POA: Insufficient documentation

## 2010-02-28 DIAGNOSIS — M5380 Other specified dorsopathies, site unspecified: Secondary | ICD-10-CM | POA: Insufficient documentation

## 2010-02-28 DIAGNOSIS — J189 Pneumonia, unspecified organism: Secondary | ICD-10-CM

## 2010-03-01 ENCOUNTER — Encounter: Payer: Self-pay | Admitting: Family Medicine

## 2010-03-02 LAB — CONVERTED CEMR LAB
Calcium: 9.5 mg/dL (ref 8.4–10.5)
Glucose, Bld: 105 mg/dL — ABNORMAL HIGH (ref 70–99)
Potassium: 3.8 meq/L (ref 3.5–5.3)
Sodium: 139 meq/L (ref 135–145)

## 2010-03-05 NOTE — Miscellaneous (Signed)
Summary: nuclear stress test  Clinical Lists Changes  Observations: Added new observation of FLUVAXDUE: 11/20/2010 (02/26/2010 12:53) Added new observation of TDBOOSTDUE: 10/30/2019 (02/26/2010 12:53) Added new observation of PNEUMVXNEXT: None (02/26/2010 12:53) Added new observation of HDLNXTDUE: [id number] (02/26/2010 12:53) Added new observation of LDLNXTDUE: [id number] (02/26/2010 12:53) Added new observation of MAMMO DUE: 10/02/2010 (02/26/2010 12:53) Added new observation of DB FT EX DUE: 05/10/2008 (02/26/2010 12:53) Added new observation of HGBA1CNXTDUE: 03/05/2009 (02/26/2010 12:53) Added new observation of CREATNXTDUE: 12/14/2009 (02/26/2010 12:53) Added new observation of POTASSIUMDUE: 12/14/2009 (02/26/2010 12:53) Added new observation of NUCST CONC: done at [facility] cards  post stress perfusion images show a normal pattern of perfusion in all regions.  The post stress left ventricular size is normal.  No scintographic evidence of inducible myocardial ischemia.  post stress ef if 60% (02/24/2010 12:55)        Nuclear ETT  Procedure date:  02/24/2010  Findings:      done at [facility] cards  post stress perfusion images show a normal pattern of perfusion in all regions.  The post stress left ventricular size is normal.  No scintographic evidence of inducible myocardial ischemia.  post stress ef if 60%   Nuclear ETT  Procedure date:  02/24/2010  Findings:      done at [facility] cards  post stress perfusion images show a normal pattern of perfusion in all regions.  The post stress left ventricular size is normal.  No scintographic evidence of inducible myocardial ischemia.  post stress ef if 60%

## 2010-03-05 NOTE — Letter (Signed)
Summary: [facility]   Imported By: Jamelle Haring 02/27/2010 09:02:30  _____________________________________________________________________  External Attachment:    Type:   Image     Comment:   External Document

## 2010-03-05 NOTE — Assessment & Plan Note (Signed)
Summary: HOSP f/u   Vital Signs:  Patient profile:   77 year old female Height:      65 inches Weight:      198 pounds O2 Sat:      98 % on Room air Temp:     98.4 degrees F oral Pulse rate:   98 / minute BP sitting:   137 / 82  (left arm) Cuff size:   large  Vitals Entered By: Isaias Cowman CMA, Deborra Medina) (February 28, 2010 2:40 PM)  O2 Flow:  Room air CC: f/u pnuemonia   Primary Care Provider:  Loyal Gambler D.O.  CC:  f/u pnuemonia.  History of Present Illness: Dx with Walking PNA after admitted to [place], [place] for one week.D/C home on 02/15/2010. Admitted 02/11/2010. Did IV ABX in the hospital. No discharge antibiotic. D/C home with oxygen via [place], but no longer using it.  pulse ox up to 98%. Pain in upper Right back/shoulder.  Painin intermittantly. No radiation of pain. No pain wiht motion of her arm.  Some discomfort with a deep breath. She would like something for pain.  Still using her inspirometer. Has been trying to walk some but has been cold this week. she wants to know when she can get back to her water aerobics classes. I do not have her discharge summary from hospital but she did bring in her two medications which are amiodarone and sotalol.  It specifically says on the bottle for its for atrial fibrillation.  We will try calling the discharge summary from hospital to make sure there is nothing else we need to follow-up on  Current Medications (verified): 1)  Metformin Hcl 500 Mg  Tabs (Metformin Hcl) .... Take 1 Tablet By Mouth Two Times A Day 2)  Caltrate 600+d 600-400 Mg-Unit  Tabs (Calcium Carbonate-Vitamin D) .... Take 1 Tablet By Mouth Two Times A Day 3)  Benazepril Hcl 40 Mg  Tabs (Benazepril Hcl) .Marland Kitchen.. 1 Tab By Mouth Daily 4)  One Touch Ultra Test Strips .... Use Two Times A Day As Directed 5)  Super Omega-3 1000 Mg  Caps (Omega-3 Fatty Acids) .... Take Two By Mouth Two Times Daily 6)  Sucralfate 1 Gm  Tabs (Sucralfate) .Marland Kitchen.. 1 Tab Po Two Times A  Day 7)  Oxybutynin Chloride 5 Mg Tabs (Oxybutynin Chloride) .... Take 1 Tablet By Mouth Two Times A Day 8)  Hydrochlorothiazide 25 Mg Tabs (Hydrochlorothiazide) .Marland Kitchen.. 1 Tab By Mouth Qam 9)  Tramadol Hcl 50 Mg Tabs (Tramadol Hcl) .Marland Kitchen.. 1 Tab By Mouth Two Times A Day As Needed Pain 10)  Norvasc 5 Mg Tabs (Amlodipine Besylate) .... 1/2 Tab By Mouth Daily 11)  Cymbalta 60 Mg Cpep (Duloxetine Hcl) .Marland Kitchen.. 1 Capsule By Mouth Daily 12)  Lorazepam 0.5 Mg Tabs (Lorazepam) .Marland Kitchen.. 1 Tab By Mouth Two Times A Day As Needed Anxiety  Allergies (verified): 1)  ! Sulfa 2)  ! Codeine 3)  ! Zocor 4)  ! * Gabapentin 5)  ! Asa 6)  ! Beta Blockers  Past History:  Past Medical History: Last updated: 09/04/2009 Depression/ Anxiety Impaired fasting glucose Hyperlipidemia Hypertension heart murmur diabetic retinopathy- Dr Jodi Mourning OA; Lumbar DDD- Dr Rolene Course Neuroma - Dr Wardell Honour foot neuropathy - Dr Starleen Blue mild renal insufficiency normal adenosine CL 2003, Dr Geanie Logan ABIs 1.45 bilat 7-07 PUD Discoid Lupus  derm : Dr Jerrel Ivory R breast Cancer 11-09 Dr Georgiann Cocker onc, Dr Robina Ade gen surgery [facility] (Dr Domingo Cocking)--  GI:  Social History: Last updated: 02/28/2010 retired bookeeper.  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  widowed for 7 yrs has a cat. 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  widowed for 7 yrs has a cat. lives in [place] appt family in area, 3 sons uses rolling walker and wheelchair  Physical Exam  General:  Well-developed,well-nourished,in no acute distress; alert,appropriate and cooperative throughout examination Head:  Normocephalic and atraumatic without obvious abnormalities. No apparent alopecia or balding. Eyes:  No corneal or conjunctival inflammation noted. EOMI. Perrla.  Ears:  External ear exam shows no significant lesions or deformities.  Otoscopic examination reveals clear canals,  tympanic membranes are intact bilaterally without bulging, retraction, inflammation or discharge. Hearing is grossly normal bilaterally. Nose:  External nasal examination shows no deformity or inflammation.  Mouth:  mucous membranes are very dry.   Lungs:  Normal respiratory effort, chest expands symmetrically. Lungs are clear to auscultation, no crackles or wheezes. Heart:  Normal rate and regular rhythm. S1 and S2 normal without gallop, click, rub or other extra sounds. 2/6 SEM.  Msk:  she is nontender of the cervical and thoracic spine.  She is slightly tender between the thoracic spine and the right scapula.  During the office visit she had sudden sharp pain in that area and I was able to palpate the muscle spasming. Pulses:  radial pulse 2+ bilaterally Skin:  no rashes.   Cervical Nodes:  No lymphadenopathy noted Psych:  Cognition and judgment appear intact. Alert and cooperative with normal attention span and concentration. No apparent delusions, illusions, hallucinations   Impression & Recommendations:  Problem # 1:  FIBRILLATION, ATRIAL (ICD-427.31) Assessment New she is doing well on amiodarone and metoprolol.  She does not complain of any dizziness or chest pain.note her blood pressure looks great today.  we will set her up with cardiology to follow-up.she is actually a regular rate and rhythm today. We will try calling the discharge summary from hospital to make sure there is nothing else we need to follow-up on Her updated medication list for this problem includes:    Norvasc 5 Mg Tabs (Amlodipine besylate) .Marland Kitchen... 1/2 tab by mouth daily    Amiodarone Hcl 200 Mg Tabs (Amiodarone hcl) .Marland Kitchen... Take 1 tablet by mouth two times a day    Metoprolol Tartrate 50 Mg Tabs (Metoprolol tartrate) .Marland Kitchen... Take 1 tablet by mouth two times a day  Orders: T-Basic Metabolic Panel ([id number]) Cardiology Referral (Cardiology)  Problem # 2:  PNEUMONIA (ICD-486) [place], Exam adn pulse ox and her  increased activity are ressuring OK to return to water aerobic next Wed.   Orders: T-CBC w/Diff [id number])  Problem # 3:  MUSCLE SPASM, BACK (ICD-724.8) Musce actually started spasming in the room. check potassium and will try a muscle relaxer. She says these have worked in the past. Gentle stretches will help too.  Her updated medication list for this problem includes:    Tramadol Hcl 50 Mg Tabs (Tramadol hcl) .Marland Kitchen... 1 tab by mouth two times a day as needed pain    Flexeril 10 Mg Tabs (Cyclobenzaprine hcl) .Marland Kitchen... 1/2 - 1 tab by mouth up to three times a day as needed for muscle spasm.  Complete Medication List: 1)  Metformin Hcl 500 Mg Tabs (Metformin hcl) .... Take 1 tablet by mouth two times a day 2)  Caltrate 600+d 600-400 Mg-unit Tabs (Calcium carbonate-vitamin d) .... Take 1 tablet by  mouth two times a day 3)  Benazepril Hcl 40 Mg Tabs (Benazepril hcl) .Marland Kitchen.. 1 tab by mouth daily 4)  One Touch Ultra Test Strips  .... Use two times a day as directed 5)  Super Omega-3 1000 Mg Caps (Omega-3 fatty acids) .... Take two by mouth two times daily 6)  Sucralfate 1 Gm Tabs (Sucralfate) .Marland Kitchen.. 1 tab po two times a day 7)  Oxybutynin Chloride 5 Mg Tabs (Oxybutynin chloride) .... Take 1 tablet by mouth two times a day 8)  Hydrochlorothiazide 25 Mg Tabs (Hydrochlorothiazide) .Marland Kitchen.. 1 tab by mouth qam 9)  Tramadol Hcl 50 Mg Tabs (Tramadol hcl) .Marland Kitchen.. 1 tab by mouth two times a day as needed pain 10)  Norvasc 5 Mg Tabs (Amlodipine besylate) .... 1/2 tab by mouth daily 11)  Cymbalta 60 Mg Cpep (Duloxetine hcl) .Marland Kitchen.. 1 capsule by mouth daily 12)  Lorazepam 0.5 Mg Tabs (Lorazepam) .Marland Kitchen.. 1 tab by mouth two times a day as needed anxiety 13)  Amiodarone Hcl 200 Mg Tabs (Amiodarone hcl) .... Take 1 tablet by mouth two times a day 14)  Metoprolol Tartrate 50 Mg Tabs (Metoprolol tartrate) .... Take 1 tablet by mouth two times a day 15)  Flexeril 10 Mg Tabs (Cyclobenzaprine hcl) .... 1/2 - 1 tab by mouth up to three  times a day as needed for muscle spasm.  Patient Instructions: 1)  Please schedule a follow-up appointment in 1 month with Dr. Valetta Close. 2)  We will call you with the Cardiology referral to Dr. Stanford Breed.   Prescriptions: FLEXERIL 10 MG TABS (CYCLOBENZAPRINE HCL) 1/2 - 1 tab by mouth up to three times a day as needed for muscle spasm.  #15 x 0   Entered and Authorized by:   Beatrice Lecher MD   Signed by:   Beatrice Lecher MD on 02/28/2010   Method used:   Electronically to        [facility] (retail)       [address]       [place], [place]  [address]       Ph: [phone]       Fax: [phone]   RxID:   [id number]    Orders Added: 1)  T-Basic Metabolic Panel 0000000 2)  T-CBC w/Diff [id number] 3)  Cardiology Referral [Cardiology] 4)  Est. Patient Level IV [id number]

## 2010-03-10 ENCOUNTER — Encounter: Payer: Self-pay | Admitting: Family Medicine

## 2010-03-11 DIAGNOSIS — Z8679 Personal history of other diseases of the circulatory system: Secondary | ICD-10-CM | POA: Insufficient documentation

## 2010-03-11 NOTE — Letter (Signed)
Summary: [facility]   Imported By: Edmonia James 03/06/2010 10:22:44  _____________________________________________________________________  External Attachment:    Type:   Image     Comment:   External Document

## 2010-03-12 ENCOUNTER — Institutional Professional Consult (permissible substitution) (INDEPENDENT_AMBULATORY_CARE_PROVIDER_SITE_OTHER): Payer: Medicare Other | Admitting: Cardiology

## 2010-03-12 ENCOUNTER — Encounter: Payer: Self-pay | Admitting: Cardiology

## 2010-03-12 DIAGNOSIS — I4891 Unspecified atrial fibrillation: Secondary | ICD-10-CM

## 2010-03-12 DIAGNOSIS — I059 Rheumatic mitral valve disease, unspecified: Secondary | ICD-10-CM | POA: Insufficient documentation

## 2010-03-12 DIAGNOSIS — I10 Essential (primary) hypertension: Secondary | ICD-10-CM

## 2010-03-20 NOTE — Assessment & Plan Note (Signed)
Summary: [facility]  Medications Added HYDROCHLOROTHIAZIDE 25 MG TABS (HYDROCHLOROTHIAZIDE) 1/2  tab by mouth qAM LORAZEPAM 0.5 MG TABS (LORAZEPAM) 1/2 tablet two times a day AMIODARONE HCL 200 MG TABS (AMIODARONE HCL) Take 1 tablet by mouth once daily ASPIRIN 81 MG TBEC (ASPIRIN) Take one tablet by mouth daily        Visit Type:  Initial Consult Primary Provider:  Loyal Gambler D.O.  CC:  irregular heart beat.  History of Present Illness: 77 year old female for evaluation of possible atrial fibrillation. H/O heart murmur. The patient was admitted in [place] on January 31 of 2012 with pneumonia and atrial fibrillation. Per the discharge summary and echocardiogram showed normal LV function. back there was moderately severe mitral regurgitation, mild tricuspid regurgitation and mild pulmonary hypertension. Cardiac markers and TSH were normal.  Chest CT showed no pulmonary embolus. She apparently was placed on amiodarone and converted to sinus rhythm. It was felt this could be discontinued in approximately 6-8 weeks as her atrial fibrillation may have been related to her pneumonia. Anticoagulation apparently was suggested but the patient declined. Pt had a nuclear study after DC that was negative by report (records not available). Since that time, she has not had chest pain, palpitations, syncope or pedal edema. She has chronic dyspnea on exertion which is unchanged. No orthopnea or PND.  Preventive Screening-Counseling & Management  Alcohol-Tobacco     Smoking Status: never  Current Medications (verified): 1)  Metformin Hcl 500 Mg  Tabs (Metformin Hcl) .... Take 1 Tablet By Mouth Two Times A Day 2)  Caltrate 600+d 600-400 Mg-Unit  Tabs (Calcium Carbonate-Vitamin D) .... Take 1 Tablet By Mouth Two Times A Day 3)  Benazepril Hcl 40 Mg  Tabs (Benazepril Hcl) .Marland Kitchen.. 1 Tab By Mouth Daily 4)  One Touch Ultra Test Strips .... Use Two Times A Day As Directed 5)  Super Omega-3 1000 Mg   Caps (Omega-3 Fatty Acids) .... Take Two By Mouth Two Times Daily 6)  Sucralfate 1 Gm  Tabs (Sucralfate) .Marland Kitchen.. 1 Tab Po Two Times A Day 7)  Oxybutynin Chloride 5 Mg Tabs (Oxybutynin Chloride) .... Take 1 Tablet By Mouth Two Times A Day 8)  Hydrochlorothiazide 25 Mg Tabs (Hydrochlorothiazide) .... 1/2  Tab By Mouth Qam 9)  Norvasc 5 Mg Tabs (Amlodipine Besylate) .... 1/2 Tab By Mouth Daily 10)  Cymbalta 60 Mg Cpep (Duloxetine Hcl) .Marland Kitchen.. 1 Capsule By Mouth Daily 11)  Lorazepam 0.5 Mg Tabs (Lorazepam) .... 1/2 Tablet Two Times A Day 12)  Amiodarone Hcl 200 Mg Tabs (Amiodarone Hcl) .... Take 1 Tablet By Mouth Two Times A Day 13)  Metoprolol Tartrate 50 Mg Tabs (Metoprolol Tartrate) .... Take 1 Tablet By Mouth Two Times A Day  Allergies: 1)  ! Sulfa 2)  ! Codeine 3)  ! Zocor 4)  ! * Gabapentin 5)  ! Asa 6)  ! Neurontin 7)  ! * Oxycodone 8)  ! * Oxycontin 9)  ! * Cholesterol Meds  Past History:  Past Medical History: HYPERLIPIDEMIA  HYPERTENSION, BENIGN ESSENTIAL FIBRILLATION, ATRIAL  ANXIETY STATE, UNSPECIFIED  LUPUS ERYTHEMATOSUS, DISCOID  PERIPHERAL NEUROPATHY IRRITABLE BOWEL SYNDROME  ADENOCARCINOMA, BREAST, RIGHT HEMORRHOIDS  RENAL INSUFFICIENCY  GASTRIC ULCER  HEADACHE, CHRONIC DISC DISEASE, LUMBAR  ANEMIA, IRON DEFICIENCY NOS DEPRESSION  diabetic retinopathy- Dr Jodi Mourning OA; Lumbar DDD- Dr Sabino Gasser [place]ons Neuroma - Dr Wardell Honour foot neuropathy - Dr Starleen Blue normal adenosine CL 2003, Dr Geanie Logan ABIs 1.45 bilat 7-07 PUD Discoid Lupus derm : Dr Jerrel Ivory  R breast Cancer 11-09 Dr Georgiann Cocker onc, Dr Robina Ade gen surgery [facility] (Dr Domingo Cocking)--  GI:    Past Surgical History: Reviewed history from 12/22/2007 and no changes required. Hysterectomy+ 1 oophorectomy Total knee replacement- both- Dr Devona Konig Lumbar Laminectomy heart cath x 3 "normal" ([place]) cholecystectomy 8-07  R partial mastectomy with sentinel lymph node biopsy 11-09  Family History: Reviewed history from  03/12/2006 and no changes required. father colon cancer, DM, stroke, high chol mother DM Mother with angina  Social History: Reviewed history from 02/28/2010 and no changes required. 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  widowed for 7 yrs has a cat. lives in [place] appt family in area, 3 sons uses rolling walker and wheelchair Tobacco Use - No.  Alcohol Use - yes  Review of Systems       no fevers or chills, productive cough, hemoptysis, dysphasia, odynophagia, melena, hematochezia, dysuria, hematuria, rash, seizure activity, orthopnea, PND, pedal edema, claudication. Remaining systems are negative.   Vital Signs:  Patient profile:   77 year old female Height:      65 inches Weight:      200.50 pounds BMI:     33.49 Pulse rate:   71 / minute Pulse rhythm:   irregular Resp:     20 per minute BP sitting:   140 / 82  (left arm) Cuff size:   large  Vitals Entered By: Sidney Ace (March 12, 2010 4:04 PM)  Physical Exam  General:  Well developed/well nourished in NAD Skin warm/dry Patient not depressed No peripheral clubbing Back-normal HEENT-normal/normal eyelids Neck supple/normal carotid upstroke bilaterally; no bruits; no JVD; no thyromegaly chest - CTA/ normal expansion CV - RRR/normal S1 and S2; no murmurs, rubs or gallops;  PMI nondisplaced Abdomen -NT/ND, no HSM, no mass, + bowel sounds, no bruit 2+ femoral pulses, no bruits Ext-no edema, chords, 2+ DP Neuro-grossly nonfocal Well developed/well nourished in NAD Skin warm/dry Patient not depressed No peripheral clubbing Back-normal HEENT-normal/normal eyelids Neck supple/normal carotid upstroke bilaterally; no bruits; no JVD; no thyromegaly chest - CTA/ normal expansion CV - RRR/normal S1 and S2; no murmurs, rubs or gallops;  PMI nondisplaced Abdomen -NT/ND, no HSM, no mass, + bowel sounds, no bruit 2+ femoral pulses, no bruits Ext-no edema, chords, 2+ DP Neuro-grossly  nonfocal    EKG  Procedure date:  03/12/2010  Findings:      Sinus rhythm at a rate of 71. Incomplete right bundle branch block. No ST changes.  Impression & Recommendations:  Problem # 1:  FIBRILLATION, ATRIAL (ICD-427.31) Patient had an episode of atrial fibrillation in late January. She is now in sinus rhythm. This certainly may have been related to her pneumonia but there also could be a contribution from her mitral regurgitation. For now we will continue with her amiodarone. I will decrease the dose to 200 mg p.o. daily. Continue beta blocker. She also has embolic risk factors age 67 and hypertension. We discussed Coumadin but she is concerned as she has a history of an ulcer. She therefore declined and understands the risk of CVA. She is agreeable to enteric-coated aspirin 81 mg p.o. daily. Her updated medication list for this problem includes:    Amiodarone Hcl 200 Mg Tabs (Amiodarone hcl) .Marland Kitchen... Take 1 tablet by mouth once daily    Metoprolol Tartrate 50 Mg Tabs (Metoprolol tartrate) .Marland Kitchen... Take 1 tablet by mouth two times a day  Problem # 2:  HYPERTENSION, BENIGN ESSENTIAL (ICD-401.1) Blood pressure controlled. Continue present medications.  Her updated medication list for this problem includes:    Benazepril Hcl 40 Mg Tabs (Benazepril hcl) .Marland Kitchen... 1 tab by mouth daily    Hydrochlorothiazide 25 Mg Tabs (Hydrochlorothiazide) .Marland Kitchen... 1/2  tab by mouth qam    Norvasc 5 Mg Tabs (Amlodipine besylate) .Marland Kitchen... 1/2 tab by mouth daily    Metoprolol Tartrate 50 Mg Tabs (Metoprolol tartrate) .Marland Kitchen... Take 1 tablet by mouth two times a day    Aspirin 81 Mg Tbec (Aspirin) .Marland Kitchen... Take one tablet by mouth daily  Problem # 3:  HYPERLIPIDEMIA (ICD-272.4) Management per primary care.  Problem # 4:  MITRAL VALVE DISORDERS (ICD-424.0) Patient with moderately severe mitral regurgitation on echocardiogram. Plan repeat study in January of 2013. Her updated medication list for this problem includes:     Benazepril Hcl 40 Mg Tabs (Benazepril hcl) .Marland Kitchen... 1 tab by mouth daily    Hydrochlorothiazide 25 Mg Tabs (Hydrochlorothiazide) .Marland Kitchen... 1/2  tab by mouth qam    Metoprolol Tartrate 50 Mg Tabs (Metoprolol tartrate) .Marland Kitchen... Take 1 tablet by mouth two times a day  Problem # 5:  LUPUS ERYTHEMATOSUS, DISCOID (ICD-695.4)  Her updated medication list for this problem includes:    Aspirin 81 Mg Tbec (Aspirin) .Marland Kitchen... Take one tablet by mouth daily  Problem # 6:  RENAL INSUFFICIENCY (ICD-588.9)  Patient Instructions: 1)  Your physician recommends that you schedule a follow-up appointment in:  2)  Your physician has recommended you make the following change in your medication: DECREASE AMIODARONE 200MG  ONCE DAILY 3)  START ASPIRIN 81MG  ONCE DAILY

## 2010-03-27 ENCOUNTER — Ambulatory Visit (INDEPENDENT_AMBULATORY_CARE_PROVIDER_SITE_OTHER): Payer: Medicare Other | Admitting: Family Medicine

## 2010-03-27 ENCOUNTER — Encounter: Payer: Self-pay | Admitting: Family Medicine

## 2010-03-27 DIAGNOSIS — I4891 Unspecified atrial fibrillation: Secondary | ICD-10-CM

## 2010-03-27 DIAGNOSIS — D509 Iron deficiency anemia, unspecified: Secondary | ICD-10-CM

## 2010-03-27 DIAGNOSIS — I10 Essential (primary) hypertension: Secondary | ICD-10-CM

## 2010-03-27 DIAGNOSIS — I059 Rheumatic mitral valve disease, unspecified: Secondary | ICD-10-CM

## 2010-03-28 ENCOUNTER — Encounter: Payer: Self-pay | Admitting: Family Medicine

## 2010-03-28 LAB — CONVERTED CEMR LAB
ALT: 10 units/L (ref 0–35)
Albumin: 4.2 g/dL (ref 3.5–5.2)
Basophils Absolute: 0 10*3/uL (ref 0.0–0.1)
CO2: 25 meq/L (ref 19–32)
Cholesterol: 179 mg/dL (ref 0–200)
Glucose, Bld: 120 mg/dL — ABNORMAL HIGH (ref 70–99)
Iron: 54 ug/dL (ref 42–145)
LDL Cholesterol: 108 mg/dL — ABNORMAL HIGH (ref 0–99)
Lymphocytes Relative: 25 % (ref 12–46)
Lymphs Abs: 1.3 10*3/uL (ref 0.7–4.0)
Neutro Abs: 3.7 10*3/uL (ref 1.7–7.7)
Neutrophils Relative %: 68 % (ref 43–77)
Platelets: 276 10*3/uL (ref 150–400)
Potassium: 4.1 meq/L (ref 3.5–5.3)
RDW: 14.8 % (ref 11.5–15.5)
Saturation Ratios: 17 % — ABNORMAL LOW (ref 20–55)
Sodium: 142 meq/L (ref 135–145)
TIBC: 317 ug/dL (ref 250–470)
Total Protein: 6.8 g/dL (ref 6.0–8.3)
Triglycerides: 92 mg/dL (ref <150)
UIBC: 263 ug/dL
VLDL: 18 mg/dL (ref 0–40)
WBC: 5.4 10*3/uL (ref 4.0–10.5)

## 2010-04-01 NOTE — Letter (Signed)
Summary: [facility]   Imported By: Laural Benes 03/25/2010 12:18:16  _____________________________________________________________________  External Attachment:    Type:   Image     Comment:   External Document

## 2010-04-01 NOTE — Assessment & Plan Note (Signed)
Summary: f/u A Fib   Vital Signs:  Patient profile:   77 year old female Height:      65 inches Weight:      196 pounds BMI:     32.73 O2 Sat:      96 % on Room air Pulse rate:   82 / minute BP sitting:   133 / 78  (left arm) Cuff size:   large  Vitals Entered By: Sherlean Foot CMA (March 27, 2010 8:54 AM)  O2 Flow:  Room air CC: 1 mo f/u   Primary Care Provider:  Loyal Gambler D.O.  CC:  1 mo f/u.  History of Present Illness: 77 yo WF presents for f/u visit.  She was Dr Jerilynn Mages for her HFU visit for PNA/ A fib and has been back to Dr Stanford Breed.  it seems that her PNA threw her into A. Fib and she was chemically cardioverted to NSR with Amiodorone.  She is supposed to be on 200 mg/ day but she is taking 100 mg two times a day.  she says that it makes her tired.  the hospital stopped her norvasc and she never restarted it.  She denies CP or palpitation.  No change in her DOE or edmea from baseline.  She had a normal stress test with [facility] cardiology in Feb 2012.  Her sugars are running well and she has no complaints today other than constipation.  She is not on iron and was told by another doctor to have her iron level rechecked.    Current Medications (verified): 1)  Metformin Hcl 500 Mg  Tabs (Metformin Hcl) .... Take 1 Tablet By Mouth Two Times A Day 2)  Caltrate 600+d 600-400 Mg-Unit  Tabs (Calcium Carbonate-Vitamin D) .... Take 1 Tablet By Mouth Two Times A Day 3)  Benazepril Hcl 40 Mg  Tabs (Benazepril Hcl) .Marland Kitchen.. 1 Tab By Mouth Daily 4)  One Touch Ultra Test Strips .... Use Two Times A Day As Directed 5)  Super Omega-3 1000 Mg  Caps (Omega-3 Fatty Acids) .... Take Two By Mouth Two Times Daily 6)  Sucralfate 1 Gm  Tabs (Sucralfate) .Marland Kitchen.. 1 Tab Po Two Times A Day 7)  Oxybutynin Chloride 5 Mg Tabs (Oxybutynin Chloride) .... Take 1 Tablet By Mouth Two Times A Day 8)  Hydrochlorothiazide 25 Mg Tabs (Hydrochlorothiazide) .... 1/2  Tab By Mouth Qam 9)  Norvasc 5 Mg Tabs (Amlodipine Besylate)  .... 1/2 Tab By Mouth Daily 10)  Cymbalta 60 Mg Cpep (Duloxetine Hcl) .Marland Kitchen.. 1 Capsule By Mouth Daily 11)  Lorazepam 0.5 Mg Tabs (Lorazepam) .... 1/2 Tablet Two Times A Day 12)  Amiodarone Hcl 200 Mg Tabs (Amiodarone Hcl) .... Take 1 Tablet By Mouth Once Daily 13)  Metoprolol Tartrate 50 Mg Tabs (Metoprolol Tartrate) .... Take 1 Tablet By Mouth Two Times A Day 14)  Aspirin 81 Mg Tbec (Aspirin) .... Take One Tablet By Mouth Daily 15)  Hydroxychloroquine Sulfate 200 Mg Tabs (Hydroxychloroquine Sulfate) .... Take 1 Tab By Mouth Once Daily  Allergies (verified): 1)  ! Sulfa 2)  ! Codeine 3)  ! Zocor 4)  ! * Gabapentin 5)  ! Asa 6)  ! Neurontin 7)  ! * Oxycodone 8)  ! * Oxycontin 9)  ! * Cholesterol Meds  Past History:  Past Medical History: Reviewed history from 03/12/2010 and no changes required. HYPERLIPIDEMIA  HYPERTENSION, BENIGN ESSENTIAL FIBRILLATION, ATRIAL  ANXIETY STATE, UNSPECIFIED  LUPUS ERYTHEMATOSUS, DISCOID  PERIPHERAL NEUROPATHY IRRITABLE BOWEL SYNDROME  ADENOCARCINOMA, BREAST, RIGHT HEMORRHOIDS  RENAL INSUFFICIENCY  GASTRIC ULCER  HEADACHE, CHRONIC DISC DISEASE, LUMBAR  ANEMIA, IRON DEFICIENCY NOS DEPRESSION  diabetic retinopathy- Dr Jodi Mourning OA; Lumbar DDD- Dr Rolene Course Neuroma - Dr Wardell Honour foot neuropathy - Dr Starleen Blue normal adenosine CL 2003, Dr Geanie Logan ABIs 1.45 bilat 7-07 PUD Discoid Lupus derm : Dr Jerrel Ivory R breast Cancer 11-09 Dr Georgiann Cocker onc, Dr Robina Ade gen surgery [facility] (Dr Domingo Cocking)--  GI:    Past Surgical History: Reviewed history from 12/22/2007 and no changes required. Hysterectomy+ 1 oophorectomy Total knee replacement- both- Dr Devona Konig Lumbar Laminectomy heart cath x 3 "normal" ([place]) cholecystectomy 8-07  R partial mastectomy with sentinel lymph node biopsy 11-09  Social History: Reviewed history from 03/12/2010 and no changes required. 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  widowed for 7 yrs has a cat. lives in  [place] appt family in area, 3 sons uses rolling walker and wheelchair Tobacco Use - No.  Alcohol Use - yes  Review of Systems      See HPI  Physical Exam  General:  alert, well-developed, well-nourished, well-hydrated, and overweight-appearing.   Head:  normocephalic and atraumatic.   Mouth:  pharynx pink and slightly dry mucosa Neck:  no masses.   Lungs:  Normal respiratory effort, chest expands symmetrically. Lungs are clear to auscultation, no crackles or wheezes. Heart:  RRR with 2/6 apical murmur Abdomen:  soft, non-tender, normal bowel sounds, no distention, no masses, and no guarding.   Msk:  no joint effusions Pulses:  2+ radial pulses Extremities:  1+ non pitting LE edema bilat Skin:  discoid lups rash on face Cervical Nodes:  No lymphadenopathy noted Psych:  good eye contact, not anxious appearing, and not depressed appearing.     Impression & Recommendations:  Problem # 1:  FIBRILLATION, ATRIAL (ICD-427.31) Assessment Improved In sinus rhythm now and asymptomatic.  Watch for palpitations, SOB, CP and call if develops these.  I asked her to take a full tab of Amiodorone once a day (as prescribed) instead of 1/2 tab two times a day as she was taking.  Add Miralax once daily for constipating SEs.  F/U with Dr Stanford Breed in May.  She decided to NOT take coumadin. The following medications were removed from the medication list:    Norvasc 5 Mg Tabs (Amlodipine besylate) .Marland Kitchen... 1/2 tab by mouth daily Her updated medication list for this problem includes:    Amiodarone Hcl 200 Mg Tabs (Amiodarone hcl) .Marland Kitchen... Take 1 tablet by mouth once daily    Metoprolol Tartrate 50 Mg Tabs (Metoprolol tartrate) .Marland Kitchen... Take 1 tablet by mouth two times a day    Aspirin 81 Mg Tbec (Aspirin) .Marland Kitchen... Take one tablet by mouth daily  Problem # 2:  MITRAL VALVE DISORDERS (ICD-424.0) I reviewed her last cardiology note.  She will likely need to have a 2D echo repeated this summer with Dr Stanford Breed to  see if her PNA caused the moderate to severe dz seen in jan. Her updated medication list for this problem includes:    Amiodarone Hcl 200 Mg Tabs (Amiodarone hcl) .Marland Kitchen... Take 1 tablet by mouth once daily    Metoprolol Tartrate 50 Mg Tabs (Metoprolol tartrate) .Marland Kitchen... Take 1 tablet by mouth two times a day    Aspirin 81 Mg Tbec (Aspirin) .Marland Kitchen... Take one tablet by mouth daily  Problem # 3:  HYPERTENSION, BENIGN ESSENTIAL (ICD-401.1) BP OK off Norvasc.  Will keep it off her list for now. The following  medications were removed from the medication list:    Norvasc 5 Mg Tabs (Amlodipine besylate) .Marland Kitchen... 1/2 tab by mouth daily Her updated medication list for this problem includes:    Benazepril Hcl 40 Mg Tabs (Benazepril hcl) .Marland Kitchen... 1 tab by mouth daily    Hydrochlorothiazide 25 Mg Tabs (Hydrochlorothiazide) .Marland Kitchen... 1/2  tab by mouth qam    Metoprolol Tartrate 50 Mg Tabs (Metoprolol tartrate) .Marland Kitchen... Take 1 tablet by mouth two times a day  BP today: 133/78 Prior BP: 140/82 (03/12/2010)  Prior 10 Yr Risk Heart Disease: 17 % (05/11/2007)  Labs Reviewed: K+: 3.8 (03/01/2010) Creat: : 1.08 (03/01/2010)   Chol: 223 (12/14/2008)   HDL: 73 (12/14/2008)   LDL: 129 (12/14/2008)   TG: 106 (12/14/2008)  Problem # 4:  ANEMIA, IRON DEFICIENCY NOS (ICD-280.9) Hx of iron def.  Due for repeat labs, off iron now. Orders: T-CBC w/Diff [phone]) T-Ferritin [phone]) T-Iron [id number]) T-Iron Binding Capacity (TIBC) ([id number])  Problem # 5:  HYPERLIPIDEMIA (ICD-272.4) Due for labs.  Ordered today.  hx of 'statin intolerance'. Orders: T-Lipid Profile [id number])  Labs Reviewed: SGOT: 22 (12/14/2008)   SGPT: 17 (12/14/2008)  Prior 10 Yr Risk Heart Disease: 17 % (05/11/2007)   HDL:73 (12/14/2008), 60 (11/08/2007)  LDL:129 (12/14/2008), 128 (11/08/2007)  Chol:223 (12/14/2008), 227 (11/08/2007)  Trig:106 (12/14/2008), 197 (11/08/2007)  Problem # 6:  LUPUS ERYTHEMATOSUS, DISCOID (ICD-695.4) Back on  Plaquenil.  has f/u with derm. Her updated medication list for this problem includes:    Aspirin 81 Mg Tbec (Aspirin) .Marland Kitchen... Take one tablet by mouth daily    Hydroxychloroquine Sulfate 200 Mg Tabs (Hydroxychloroquine sulfate) .Marland Kitchen... Take 1 tab by mouth once daily  Complete Medication List: 1)  Metformin Hcl 500 Mg Tabs (Metformin hcl) .... Take 1 tablet by mouth two times a day 2)  Caltrate 600+d 600-400 Mg-unit Tabs (Calcium carbonate-vitamin d) .... Take 1 tablet by mouth two times a day 3)  Benazepril Hcl 40 Mg Tabs (Benazepril hcl) .Marland Kitchen.. 1 tab by mouth daily 4)  One Touch Ultra Test Strips  .... Use two times a day as directed 5)  Super Omega-3 1000 Mg Caps (Omega-3 fatty acids) .... Take two by mouth two times daily 6)  Sucralfate 1 Gm Tabs (Sucralfate) .Marland Kitchen.. 1 tab po two times a day 7)  Oxybutynin Chloride 5 Mg Tabs (Oxybutynin chloride) .... Take 1 tablet by mouth two times a day 8)  Hydrochlorothiazide 25 Mg Tabs (Hydrochlorothiazide) .... 1/2  tab by mouth qam 9)  Cymbalta 60 Mg Cpep (Duloxetine hcl) .Marland Kitchen.. 1 capsule by mouth daily 10)  Lorazepam 0.5 Mg Tabs (Lorazepam) .... 1/2 tablet two times a day 11)  Amiodarone Hcl 200 Mg Tabs (Amiodarone hcl) .... Take 1 tablet by mouth once daily 12)  Metoprolol Tartrate 50 Mg Tabs (Metoprolol tartrate) .... Take 1 tablet by mouth two times a day 13)  Aspirin 81 Mg Tbec (Aspirin) .... Take one tablet by mouth daily 14)  Hydroxychloroquine Sulfate 200 Mg Tabs (Hydroxychloroquine sulfate) .... Take 1 tab by mouth once daily  Other Orders: T-Comprehensive Metabolic Panel ([id number])  Patient Instructions: 1)  Update fasting labs downstairs today. 2)  Start MIRALAX once daily OTC for constipation. 3)  f/u pre-diabetes/ BP in 3 mos.   Orders Added: 1)  T-CBC w/Diff [id number] 2)  T-Ferritin [[id number]] 3)  T-Iron [id number] 4)  T-Iron Binding Capacity (TIBC) [id number] 5)  T-Lipid Profile [id number] 6)  T-Comprehensive  Metabolic Panel [id number] 7)  Est. Patient Level IV [id number]

## 2010-04-14 ENCOUNTER — Encounter: Payer: Self-pay | Admitting: Family Medicine

## 2010-04-17 ENCOUNTER — Ambulatory Visit: Payer: Medicare Other | Admitting: Family Medicine

## 2010-04-28 ENCOUNTER — Other Ambulatory Visit: Payer: Self-pay | Admitting: Family Medicine

## 2010-05-06 ENCOUNTER — Encounter: Payer: Self-pay | Admitting: Family Medicine

## 2010-05-12 ENCOUNTER — Other Ambulatory Visit: Payer: Self-pay | Admitting: Family Medicine

## 2010-05-14 ENCOUNTER — Other Ambulatory Visit: Payer: Self-pay | Admitting: Family Medicine

## 2010-05-19 ENCOUNTER — Encounter: Payer: Self-pay | Admitting: Cardiology

## 2010-05-21 ENCOUNTER — Encounter: Payer: Self-pay | Admitting: Cardiology

## 2010-05-21 ENCOUNTER — Ambulatory Visit
Admission: RE | Admit: 2010-05-21 | Discharge: 2010-05-21 | Disposition: A | Discharge status: Home / Self Care | Payer: Medicare Other | Source: Ambulatory Visit | Attending: Cardiology | Admitting: Cardiology

## 2010-05-21 ENCOUNTER — Ambulatory Visit (INDEPENDENT_AMBULATORY_CARE_PROVIDER_SITE_OTHER): Payer: Medicare Other | Admitting: Cardiology

## 2010-05-21 DIAGNOSIS — I4891 Unspecified atrial fibrillation: Secondary | ICD-10-CM

## 2010-05-21 DIAGNOSIS — I10 Essential (primary) hypertension: Secondary | ICD-10-CM

## 2010-05-21 DIAGNOSIS — I059 Rheumatic mitral valve disease, unspecified: Secondary | ICD-10-CM

## 2010-05-21 DIAGNOSIS — E785 Hyperlipidemia, unspecified: Secondary | ICD-10-CM

## 2010-05-21 NOTE — Patient Instructions (Signed)
Your physician wants you to follow-up in: 6 MONTHS You will receive a reminder letter in the mail two months in advance. If you don't receive a letter, please call our office to schedule the follow-up appointment.   A chest x-ray takes a picture of the organs and structures inside the chest, including the heart, lungs, and blood vessels. This test can show several things, including, whether the heart is enlarges; whether fluid is building up in the lungs; and whether pacemaker / defibrillator leads are still in place.   Your physician recommends that you return for lab work in: [place]

## 2010-05-21 NOTE — Progress Notes (Signed)
HPI: 77 year old female for FU of atrial fibrillation. The patient was admitted in [place] on January 31 of 2012 with pneumonia and atrial fibrillation. Per the discharge summary echocardiogram showed normal LV function;  there was moderately severe mitral regurgitation, mild tricuspid regurgitation and mild pulmonary hypertension. Cardiac markers and TSH were normal.  Chest CT showed no pulmonary embolus. She apparently was placed on amiodarone and converted to sinus rhythm. It was felt this could be discontinued in approximately 6-8 weeks as her atrial fibrillation may have been related to her pneumonia. Anticoagulation apparently was suggested but the patient declined. Pt had a nuclear study after DC that was negative by report (records not available). I last saw her in Feb 2012. We felt that MR may be contributing to afib and continued amiodarone. Since then, the patient has dyspnea with more extreme activities but not with routine activities. It is relieved with rest. It is not associated with chest pain. There is no orthopnea, PND or pedal edema. There is no syncope or palpitations. There is no exertional chest pain.   Current Outpatient Prescriptions  Medication Sig Dispense Refill  . amiodarone (PACERONE) 200 MG tablet Take 200 mg by mouth daily.        Marland Kitchen aspirin 81 MG tablet Take 81 mg by mouth daily.        . benazepril (LOTENSIN) 40 MG tablet TAKE 1 TABLET DAILY  30 tablet  2  . Calcium Carbonate-Vitamin D (CALTRATE 600+D) 600-400 MG-UNIT per tablet Take 1 tablet by mouth 2 (two) times daily.       . CYMBALTA 60 MG capsule TAKE (1) CAPSULE DAILY.  30 each  3  . Ferrous Sulfate (IRON) 325 (65 FE) MG TABS Take 1 tablet by mouth daily.        Marland Kitchen glucose blood test strip 1 each by Other route as needed. One Touch Ultra Test Strips-Use bid as instructed       . hydroxychloroquine (PLAQUENIL) 200 MG tablet Take by mouth daily.        Marland Kitchen LORazepam (ATIVAN) 0.5 MG tablet Take 0.5 mg by mouth 2  (two) times daily.        . metFORMIN (GLUCOPHAGE) 500 MG tablet Take 500 mg by mouth 2 (two) times daily with a meal.        . metoprolol (LOPRESSOR) 50 MG tablet Take 50 mg by mouth 2 (two) times daily.        . Omega-3 Fatty Acids (SUPER OMEGA-3) 1000 MG CAPS Take by mouth. Take two po bid       . oxybutynin (DITROPAN) 5 MG tablet        . sucralfate (CARAFATE) 1 G tablet Take 1 g by mouth 2 (two) times daily.        Marland Kitchen DISCONTD: oxybutynin (DITROPAN) 5 MG tablet TAKE 1 TABLET TWICE A DAY  60 tablet  3     Past Medical History  Diagnosis Date  . Hyperlipidemia   . Hypertension   . Atrial fibrillation   . Anxiety   . Lupus erythematosus     discoid  . Peripheral neuropathy   . IBS (irritable bowel syndrome)   . Adenocarcinoma     breast, right  . Hemorrhoids   . Renal insufficiency   . Gastric ulcer   . Headache     chronic  . Lumbar disc disease   . Anemia     iron deficiency- NOS  . Depression   . Diabetic retinopathy  Dr Jodi Mourning  . DDD (degenerative disc disease), lumbar     Dr Owens Shark  . Morton neuroma     right- Dr Wardell Honour  . PUD (peptic ulcer disease)   . Discoid lupus     Past Surgical History  Procedure Date  . Abdominal hysterectomy     1 oophorectomy  . Total knee arthroplasty     both- Dr Devona Konig  . Lumbar laminectomy   . Right partial mastectomy w/sentinel lymph node biopsy 11-09  . Cholecystectomy 08/2005    History   Social History  . Marital Status: Widowed    Spouse Name: N/A    Number of Children: 3  . Years of Education: N/A   Occupational History  . Retired    Social History Main Topics  . Smoking status: Never Smoker   . Smokeless tobacco: Not on file  . Alcohol Use: Yes  . Drug Use: Not on file  . Sexually Active: Not on file   Other Topics Concern  . Not on file   Social History Narrative  . No narrative on file    ROS: no fevers or chills, productive cough, hemoptysis, dysphasia, odynophagia, melena, hematochezia,  dysuria, hematuria, rash, seizure activity, orthopnea, PND, pedal edema, claudication. Remaining systems are negative.  Physical Exam: Well-developed well-nourished in no acute distress.  Skin is warm and dry.  HEENT is normal.  Neck is supple. No thyromegaly.  Chest is clear to auscultation with normal expansion.  Cardiovascular exam is regular rate and rhythm. 2/6 systolic murmur at apex Abdominal exam nontender or distended. No masses palpated. Extremities show no edema. neuro grossly intact  ECG Normal sinus rhythm at a rate of 67. Occasional PVC. Left axis deviation. Incomplete right bundle branch block.

## 2010-05-21 NOTE — Assessment & Plan Note (Signed)
History of moderately severe mitral regurgitation. Repeat echocardiogram January 2013.

## 2010-05-21 NOTE — Assessment & Plan Note (Signed)
Management per primary care. 

## 2010-05-21 NOTE — Assessment & Plan Note (Signed)
Atrial fibrillation in the past occurred in the setting of pneumonia the patient also has significant mitral regurgitation based on previous echocardiogram. I have elected to continue amiodarone for now. Continue beta blocker. She remained in sinus rhythm. Continue aspirin. Patient declined Coumadin in the past. Plan repeat echocardiogram in January of 2013. If mitral regurgitation remains moderate I will consider discontinuing amiodarone to see if she maintains sinus rhythm without an antiarrhythmic.

## 2010-05-21 NOTE — Assessment & Plan Note (Signed)
Blood pressure elevated. However she states typically normal at home. I have asked her to track this and we will add additional medications as needed. Our goal systolic blood pressure will be less than 130. Our goal diastolic will be less than 85.

## 2010-05-27 NOTE — Assessment & Plan Note (Signed)
[place] OFFICE NOTE   GERTRUDIS, ROE                     MRN:          [id number]  DATE:06/23/2006                            DOB:          05/09/1932    OTHER PROBLEMS:  See my past medical history of April 16, 2006. She also  had an anemia associated with her gastric ulcer.   INTERVAL HISTORY:  She has done well, though she developed diarrhea on  the Zegerid I gave her. Her EGD showed a very deep gastric ulcer with  benign biopsies and no H-pylori as well as antral gastritis. Her  colonoscopy showed internal and external hemorrhoids otherwise.   MEDICATIONS:  Listed and reviewed in the chart. She had to stop the  Zegerid due to diarrhea. Her appetite has been good. No other  complaints.   Height 5 feet, 5 inches. Weight 178 pounds, pulse 72, blood pressure  130/84.   Note, ALLERGIC TO SULFA, CODEINE, ZOCOR AND MACVICAR AND ZEGERID HAS  CAUSED DIARRHEA.   She has been changed from metformin to Actos plus.   ASSESSMENT:  Gastric ulcer likely due to NSAIDs. She has told me her  joints are swelling a little bit but they are tolerable and Tylenol  helps a little bit. It is mainly in her hands.   PLAN:  1. Change to Protonix 40 mg daily.  2. Repeat EGD early August.  3. After that will determine appropriateness of re-institution of      NSAIDs. She is to call if there are problems sooner.     Gatha Mayer, MD,FACG  Electronically Signed    CEG/MedQ  DD: 06/23/2006  DT: 06/23/2006  Job #: [id number]   cc:   Loyal Gambler, D.O.

## 2010-05-28 ENCOUNTER — Other Ambulatory Visit: Payer: Self-pay | Admitting: Family Medicine

## 2010-05-29 ENCOUNTER — Encounter: Payer: Self-pay | Admitting: Cardiology

## 2010-05-30 NOTE — Assessment & Plan Note (Signed)
[place] OFFICE NOTE   [name], [name]                     MRN:          [id number]  DATE:04/20/2006                            DOB:          1932-07-07    REFERRING PHYSICIAN:  Loyal Gambler, D.O.   CHIEF COMPLAINT:  Blood in stool/bright red blood per rectum.   ASSESSMENT:  1. Hematochezia, blood in stool, was associated with an anemia.  She      has a low iron saturation.  She also has an elevated creatinine, so      it could be iron deficiency or a mixed picture of chronic disease      and iron deficiency.  Her B12 and folate are normal.  2. Family history of colon cancer in her father who had rectal cancer.      A grandmother on her father's side had a gynecologic malignancy      that spread to the colon.   RECOMMENDATIONS AND PLAN:  1. Proceed with colonoscopy.  2. May need to consider an upper endoscopy to evaluate anemia.      Depending upon the results of the colonoscopy, though, I do not      think that is absolutely indicated.  3. Note she has had some weight loss, but that clearly seems to be      related to a statin myopathy/myositis problem that caused quite a      bit of disability.   HISTORY:  A 77 year old white woman who went to see Dr. Valetta Close to  establish care recently and complained of bright red blood per rectum,  also recently.  Lab studies, findings, as above.  She was given  AnaMantle cream and that helped.  In fact, the bleeding has stopped.  She never had pain, but Dr. Valetta Close identified a fissure on exam.  She  complains of some intermittent, alternating bowel habits of hard and  loose stools, i.e., constipation and diarrhea.  She believes she has  hemorrhoids.  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  She  denies  any upper abdominal pain or upper GI symptoms.  Last colonoscopy  was sometime in the 1990s by Dr. Darlis Loan, she thinks 26.  That  was okay.  She may have had polyps, however, and she was recommended for  followup due to either that, and definitely for her family history of  colon cancer.  She never got there because her husband became ill, and  then the patient had 2 knee replacements, she says.   PAST MEDICAL HISTORY:  1. Hypertension.  2. Diabetes mellitus.  3. Osteoarthritis.  4. Mevacor myositis.  5. Low back surgery.  6. Bilateral knee replacement.  7. Cardiac catheterization in the past, but no coronary artery disease      listed.  8. Cholecystectomy.  9. Prior hysterectomy.  10.Dyslipidemia.  11.Anemia.  12.Renal insufficiency.   MEDICATIONS:  1. Aspirin 81 mg per  day.  2. Potassium chloride 20 mEq daily.  3. Metformin HCL 500 mg b.i.d.  4. Detrol LA 4 mg daily.  5. Calcium plus vitamin D 600 mg daily.  6. Atenolol/chlorthalidone 100/25 mg daily.  7. Benazepril 20 mg daily.  8. Diclofenac 75 mg 2 each day.  9. Actos 15 mg daily.  10.Alprazolam 0.25 mg nightly.  11.Cymbalta 60 mg daily.  12.Iron and multivitamin daily.  13.Pentazocine/naloxone p.r.n.  14.Cyclobenzaprine p.r.n.   DRUG ALLERGIES:  SULFA, CODEINE, ZOCOR, MEVACOR.   FAMILY HISTORY/SOCIAL HISTORY/REVIEW OF SYSTEMS:  See my medical history  form for full details, otherwise as above.  She is widowed.  She is here  with her daughter.  She lives alone.  She is using a walker right now.  No alcohol, tobacco or drugs.   PHYSICAL EXAMINATION:  A somewhat chronically ill, elderly white female.  Height 5 feet 5 inches, weight 185 pounds.  Blood pressure 110/60, pulse  85.  EYES:  Anicteric.  ENT: Teeth in fair to good repair and lower dentures.  She has a lot of  dental work on the upper jaw.  LUNGS:  Clear.  HEART:  S1, S2, no rubs or gallops.  NECK:  Supple, no thyromegaly or mass.  ABDOMEN:   Soft, nontender, no organomegaly or mass.  RECTAL EXAM:  Deferred.  LYMPHATIC:  No neck or supraclavicular node.  LOWER EXTREMITIES:  Trace bilateral lower extremity edema.  SKIN:  Pale, no acute rash.  NEURO/PSYCH:  She is alert and oriented x3.  She is somewhat diffusely  weak, particularly in the lower extremities, as it is difficult for her  to stand up on the exam table without assistance.   Note, I have explained the risks, benefits, and indications of  colonoscopy.  She understands and agrees to proceed.  It looks like her  last colonoscopy was in 1998 with a followup recommended in 2003.   I have reviewed the office notes and labs sent by Dr. Valetta Close.  I  appreciate the opportunity to care for this patient.     Gatha Mayer, MD,FACG  Electronically Signed    CEG/MedQ  DD: 04/20/2006  DT: 04/20/2006  Job #: [id number]   cc:   Loyal Gambler, D.O.

## 2010-06-02 ENCOUNTER — Ambulatory Visit
Admission: RE | Admit: 2010-06-02 | Discharge: 2010-06-02 | Disposition: A | Discharge status: Home / Self Care | Payer: Medicare Other | Source: Ambulatory Visit | Attending: Family Medicine | Admitting: Family Medicine

## 2010-06-02 ENCOUNTER — Encounter: Payer: Self-pay | Admitting: Family Medicine

## 2010-06-02 ENCOUNTER — Telehealth: Payer: Self-pay | Admitting: Family Medicine

## 2010-06-02 ENCOUNTER — Ambulatory Visit (INDEPENDENT_AMBULATORY_CARE_PROVIDER_SITE_OTHER): Payer: Medicare Other | Admitting: Family Medicine

## 2010-06-02 VITALS — BP 140/86 | HR 71 | Resp 20 | Ht 63.0 in | Wt 195.0 lb

## 2010-06-02 DIAGNOSIS — M545 Low back pain: Secondary | ICD-10-CM

## 2010-06-02 DIAGNOSIS — M5137 Other intervertebral disc degeneration, lumbosacral region: Secondary | ICD-10-CM

## 2010-06-02 MED ORDER — TRAMADOL HCL 50 MG PO TABS: 50.0000 mg | ORAL_TABLET | Freq: Three times a day (TID) | ORAL | Status: AC | PRN

## 2010-06-02 MED ORDER — METAXALONE 800 MG PO TABS: 800.0000 mg | ORAL_TABLET | Freq: Three times a day (TID) | ORAL | Status: DC

## 2010-06-02 NOTE — Patient Instructions (Signed)
Use Tramadol for pain up to 3 x a day for pain. OK to use tylenol as needed.  Use skelaxin instead of cyclobenazaprine for muscle spasm - start 1-2 x a day as needed.  Take it easy.  Use heat/ ice.  Call if not improved in 10 days.

## 2010-06-02 NOTE — Assessment & Plan Note (Signed)
Flare up of lumbar DDD from doing housework 2 wks ago.  Unable to take NSAIDs due to CKD.  At risk for vertebral fx, so will get an Xray today.  F/u results tomorrow.  Start Tramadol prn for pain and change flexeril to skelaxin for muscle relaxer.

## 2010-06-02 NOTE — Progress Notes (Signed)
Subjective:    Patient ID: [name], female    DOB: 03-11-1932, 77 y.o.   MRN: [id number]  HPI  77 yo WF presents for LBP x  2 wks..  She has a hx of lumbar DDD.  She has been taking flexeril.  She was cleaning her house when this first started.  She is taking tylenol for pain which helps some.  She does not take NSAIDs due to CKD.  She gets very sleepy from flexeril.  She had disc surgery years ago and a hx of osteoporosis and cancer.  BP 140/86  Pulse 71  Resp 20  Ht 5\' 3"  (1.6 m)  Wt 195 lb (88.451 kg)  BMI 34.54 kg/m2  SpO2 98%  Past Medical History  Diagnosis Date  . Hyperlipidemia   . Hypertension   . Atrial fibrillation   . Anxiety   . Lupus erythematosus     discoid  . Peripheral neuropathy   . IBS (irritable bowel syndrome)   . Adenocarcinoma     breast, right  . Hemorrhoids   . Renal insufficiency   . Gastric ulcer   . Headache     chronic  . Lumbar disc disease   . Anemia     iron deficiency- NOS  . Depression   . Diabetic retinopathy     Dr Jodi Mourning  . DDD (degenerative disc disease), lumbar     Dr Owens Shark  . Morton neuroma     right- Dr Wardell Honour  . PUD (peptic ulcer disease)   . Discoid lupus     Past Surgical History  Procedure Date  . Abdominal hysterectomy     1 oophorectomy  . Total knee arthroplasty     both- Dr Devona Konig  . Lumbar laminectomy   . Right partial mastectomy w/sentinel lymph node biopsy 11-09  . Cholecystectomy 08/2005    Family History  Problem Relation Age of Onset  . Diabetes Mother   . Angina Mother   . COPD Father     colon  . Diabetes Father   . Stroke Father   . Hyperlipidemia Father   . Colon cancer Father     History   Social History  . Marital Status: Widowed    Spouse Name: N/A    Number of Children: 3  . Years of Education: N/A   Occupational History  . Retired    Social History Main Topics  . Smoking status: Never Smoker   . Smokeless tobacco: Not on file  . Alcohol Use: Yes  . Drug Use:  Not on file  . Sexually Active: Not on file   Other Topics Concern  . Not on file   Social History Narrative  . No narrative on file    Allergies  Allergen Reactions  . Aspirin   . Beta Adrenergic Blockers   . Codeine   . Gabapentin   . Oxycodone   . Oxycodone Hcl   . Simvastatin   . Sulfonamide Derivatives     Current outpatient prescriptions:amiodarone (PACERONE) 200 MG tablet, Take 200 mg by mouth daily.  , Disp: , Rfl: ;  aspirin 81 MG tablet, Take 81 mg by mouth daily.  , Disp: , Rfl: ;  benazepril (LOTENSIN) 40 MG tablet, TAKE 1 TABLET DAILY, Disp: 30 tablet, Rfl: 2;  Calcium Carbonate-Vitamin D (CALTRATE 600+D) 600-400 MG-UNIT per tablet, Take 1 tablet by mouth 2 (two) times daily. , Disp: , Rfl:  CYMBALTA 60 MG capsule, TAKE (1) CAPSULE DAILY., Disp:  30 each, Rfl: 3;  Ferrous Sulfate (IRON) 325 (65 FE) MG TABS, Take 1 tablet by mouth daily.  , Disp: , Rfl: ;  hydroxychloroquine (PLAQUENIL) 200 MG tablet, Take by mouth daily.  , Disp: , Rfl: ;  LORazepam (ATIVAN) 0.5 MG tablet, Take 0.5 mg by mouth 2 (two) times daily.  , Disp: , Rfl:  metFORMIN (GLUCOPHAGE) 500 MG tablet, Take 500 mg by mouth 2 (two) times daily with a meal.  , Disp: , Rfl: ;  Omega-3 Fatty Acids (SUPER OMEGA-3) 1000 MG CAPS, Take by mouth. Take two po bid , Disp: , Rfl: ;  oxybutynin (DITROPAN) 5 MG tablet,  , Disp: , Rfl: ;  sucralfate (CARAFATE) 1 G tablet, TAKE 1 TABLET TWICE A DAY, Disp: 60 tablet, Rfl: 2 glucose blood test strip, 1 each by Other route as needed. One Touch Ultra Test Strips-Use bid as instructed , Disp: , Rfl: ;  metaxalone (SKELAXIN) 800 MG tablet, Take 1 tablet (800 mg total) by mouth 3 (three) times daily., Disp: 90 tablet, Rfl: 0;  metoprolol (LOPRESSOR) 50 MG tablet, Take 50 mg by mouth 2 (two) times daily.  , Disp: , Rfl:  traMADol (ULTRAM) 50 MG tablet, Take 1 tablet (50 mg total) by mouth every 8 (eight) hours as needed for pain., Disp: 30 tablet, Rfl: 0   Review of Systems    Constitutional: Negative for fatigue.  Genitourinary: Negative for difficulty urinating.  Musculoskeletal: Positive for myalgias (thoracic back spasms), back pain, arthralgias and gait problem (using walker).  Neurological: Negative for dizziness, tremors, weakness, light-headedness, numbness and headaches.       Objective:   Physical Exam  Constitutional: She appears well-developed and well-nourished. No distress.       obese  Cardiovascular: Normal rate, regular rhythm and normal heart sounds.   Pulmonary/Chest: Effort normal and breath sounds normal.  Musculoskeletal:       Tender L>R at L4-L5 and over L sciatic notch  Neurological:       Intact sensation to gross touch, both legs.  Able to ambulate with walker but has pain with getting out of the chair and fully extending her L spine          Assessment & Plan:

## 2010-06-02 NOTE — Telephone Encounter (Signed)
Pls let pt know that she has severe arthritis in her lower back with a ? Compression of the L4 vertebrae and ? Diskitis.  I'd either like to get her in with an orthopedist for these findings or proceed with an MRI (if she does not have any metal in her body).

## 2010-06-04 NOTE — Telephone Encounter (Signed)
Pt does have metal in her knees so she can not have MRI. She would like to see ortho downstairs.

## 2010-06-04 NOTE — Telephone Encounter (Signed)
Done

## 2010-06-12 ENCOUNTER — Other Ambulatory Visit: Payer: Self-pay | Admitting: Orthopedic Surgery

## 2010-06-12 DIAGNOSIS — R52 Pain, unspecified: Secondary | ICD-10-CM

## 2010-06-12 DIAGNOSIS — R531 Weakness: Secondary | ICD-10-CM

## 2010-06-21 ENCOUNTER — Ambulatory Visit
Admission: RE | Admit: 2010-06-21 | Discharge: 2010-06-21 | Disposition: A | Discharge status: Home / Self Care | Payer: Medicare Other | Source: Ambulatory Visit | Attending: Orthopedic Surgery | Admitting: Orthopedic Surgery

## 2010-06-21 DIAGNOSIS — R52 Pain, unspecified: Secondary | ICD-10-CM

## 2010-06-21 DIAGNOSIS — R531 Weakness: Secondary | ICD-10-CM

## 2010-06-22 ENCOUNTER — Inpatient Hospital Stay (HOSPITAL_COMMUNITY)
Admission: EM | Admit: 2010-06-22 | Discharge: 2010-06-30 | DRG: 490 | Disposition: A | Discharge status: Home / Self Care | Payer: Medicare Other | Attending: Internal Medicine | Admitting: Internal Medicine

## 2010-06-22 DIAGNOSIS — L930 Discoid lupus erythematosus: Secondary | ICD-10-CM | POA: Diagnosis present

## 2010-06-22 DIAGNOSIS — E119 Type 2 diabetes mellitus without complications: Secondary | ICD-10-CM | POA: Diagnosis present

## 2010-06-22 DIAGNOSIS — M519 Unspecified thoracic, thoracolumbar and lumbosacral intervertebral disc disorder: Principal | ICD-10-CM | POA: Diagnosis present

## 2010-06-22 DIAGNOSIS — I4891 Unspecified atrial fibrillation: Secondary | ICD-10-CM | POA: Diagnosis present

## 2010-06-22 DIAGNOSIS — E876 Hypokalemia: Secondary | ICD-10-CM | POA: Diagnosis present

## 2010-06-22 DIAGNOSIS — M8448XA Pathological fracture, other site, initial encounter for fracture: Secondary | ICD-10-CM | POA: Diagnosis present

## 2010-06-22 DIAGNOSIS — N390 Urinary tract infection, site not specified: Secondary | ICD-10-CM | POA: Diagnosis present

## 2010-06-22 DIAGNOSIS — A498 Other bacterial infections of unspecified site: Secondary | ICD-10-CM | POA: Diagnosis present

## 2010-06-22 DIAGNOSIS — E785 Hyperlipidemia, unspecified: Secondary | ICD-10-CM | POA: Diagnosis present

## 2010-06-22 DIAGNOSIS — I10 Essential (primary) hypertension: Secondary | ICD-10-CM | POA: Diagnosis present

## 2010-06-22 DIAGNOSIS — Z7982 Long term (current) use of aspirin: Secondary | ICD-10-CM

## 2010-06-22 DIAGNOSIS — K259 Gastric ulcer, unspecified as acute or chronic, without hemorrhage or perforation: Secondary | ICD-10-CM | POA: Diagnosis present

## 2010-06-22 LAB — CBC
HCT: 38.6 % (ref 36.0–46.0)
MCH: 29.5 pg (ref 26.0–34.0)
MCV: 87.7 fL (ref 78.0–100.0)
RDW: 14.5 % (ref 11.5–15.5)
WBC: 10.5 10*3/uL (ref 4.0–10.5)

## 2010-06-22 LAB — URINE MICROSCOPIC-ADD ON

## 2010-06-22 LAB — BASIC METABOLIC PANEL
BUN: 31 mg/dL — ABNORMAL HIGH (ref 6–23)
CO2: 27 mEq/L (ref 19–32)
Calcium: 9.2 mg/dL (ref 8.4–10.5)
Chloride: 98 mEq/L (ref 96–112)
Creatinine, Ser: 0.89 mg/dL (ref 0.4–1.2)
GFR calc Af Amer: >60 mL/min (ref >60)

## 2010-06-22 LAB — URINALYSIS, ROUTINE W REFLEX MICROSCOPIC
Bilirubin Urine: NEGATIVE
Glucose, UA: NEGATIVE mg/dL
Ketones, ur: NEGATIVE mg/dL
Protein, ur: 30 mg/dL — AB
pH: 5 (ref 5.0–8.0)

## 2010-06-22 LAB — HEMOGLOBIN A1C: Hgb A1c MFr Bld: 6 % — ABNORMAL HIGH (ref <5.7)

## 2010-06-22 LAB — GLUCOSE, CAPILLARY: Glucose-Capillary: 291 mg/dL — ABNORMAL HIGH (ref 70–99)

## 2010-06-22 NOTE — H&P (Signed)
[name], Wise NO.:  [id number]  MEDICAL RECORD NO.:  [id number]  LOCATION:  [facility]                         FACILITY:  [facility]  PHYSICIAN:  Azzie Roup, MD      DATE OF BIRTH:  [birth year]  DATE OF ADMISSION:  06/22/2010 DATE OF DISCHARGE:                             HISTORY & PHYSICAL   PRIMARY CARE PROVIDER:  Dr. Valetta Close from [facility].  CHIEF COMPLAINT:  Back pain.  HISTORY OF PRESENT ILLNESS:  [name] is a pleasant 77 year old female with a history of hypertension, atrial fibrillation, discoid lupus, and prior gastric ulcers and multiple previous issues with her back who presents to the emergency room today due to uncontrolled back pain limiting her ability to take care herself at home.  The patient states that her pain is in her lower back and that she has radiating pain that was goes down to her toes on both sides.  She also has some mild numbness in her feet that she states that this has been present for about 3-4 years. She states that today she has had more difficulty than normal finding a comfortable position.  She is able to walk with a walker but had more difficulty doing that this morning.  As a result of these problems, she decided to come to [facility] ER.  Of note, she sees Dr. Sharol Given of the Orthopedic Service on an outpatient basis and she just had an MRI of her spine yesterday.  ALLERGIES:  STATINS and NSAIDs resulted in worsening gastric ulcers and OXYCODONE she had an adverse reaction too.  MEDICATIONS: 1. Amiodarone 200 mg p.o. b.i.d. 2. Aspirin 81 mg p.o. daily. 3. Benazepril 40 mg p.o. daily. 4. Cymbalta 60 mg p.o. daily. 5. Fish oil 2 g p.o. b.i.d. 6. Iron 325 mg p.o. daily. 7. Metaxalone 800 mg p.o. t.i.d. 8. Metformin 500 mg p.o. b.i.d. 9. Metoprolol 50 mg p.o. b.i.d. 10.Oxybutynin 5 mg p.o. daily. 11.Sucralfate 1 g p.o. b.i.d. 12.Ultram 50 mg p.o. b.i.d. p.r.n. pain. 13.Calcium plus D orally twice  daily.  PAST MEDICAL HISTORY:  Significant for a prior laminectomy at L3-L4, history of a gastric ulcer secondary to NSAIDs, right breast cancer status post lumpectomy, radiation therapy, chemotherapy and Herceptin. She has been cancer free since January and doing well.  Diabetes mellitus, hypertension, discoid lupus, status post bilateral knee replacement surgeries, and paroxysmal atrial fibrillation.  SOCIAL HISTORY:  She lives in [place] in an apartment by herself. She has a son in the area and another son who is in [place]. Unfortunately, they are both out of town in [place] this weekend.  She had a remote brief smoking history when she was younger, but has not used any tobacco since uses alcohol rarely.  She denies use of illicit drugs.  FAMILY HISTORY:  Her father had colon cancer.  REVIEW OF SYSTEMS:  Review of 10 systems was performed and is negative with exception of complaints as previously detailed in the HPI.  PHYSICAL EXAMINATION:  VITAL SIGNS:  Temperature 97.8, blood pressure 129/70, pulse 85, and respirations 16. GENERAL:  This is a well-appearing, elderly Caucasian female, in no acute distress, lying in bed. HEENT:  Normocephalic and atraumatic.  Extraocular movements intact. Moist mucous membranes. NECK:  No jugular venous distention.  No carotid bruits. CARDIOVASCULAR:  Regular rate and rhythm.  S1 and S2.  There is 2/6 systolic ejection murmur over the right upper sternal border. LUNGS:  Clear to auscultation bilaterally.  No crackles, wheezes, or rhonchi. ABDOMEN:  Soft, obese, nontender, nondistended.  Normoactive bowel sounds. EXTREMITIES: 1+ lower extremity edema bilaterally. MUSCULOSKELETAL:  The patient has some focal tenderness over her central lower lumbar spine.  She did not have any significant paraspinal muscle tenderness.  Her reflexes were intact in her lower extremities. NEUROLOGIC:  Awake, alert, oriented x3.  No focal motor or  sensory deficits. PSYCH:  Appropriate mood and affect.  LABORATORY DATA:  WBC 10.5, hemoglobin 13, hematocrit 38.6, and platelets 264.  Sodium 137, potassium 3.9, chloride 98, bicarb 27, BUN 31, creatinine 0.89, and glucose 141.  An MRA of her spine done yesterday reveals multiple findings, some of the salient features are L1 compression fracture appears to be new, multilevel degenerative disk disease, moderately severe canal stenosis at L5-S1, status post right laminotomy in L3-L4, and L5 compression fracture.  ASSESSMENT AND PLAN:  This is a 77 year old female with a history of multiple back problems in the past, who presents with back pain that is worsened.  Importantly, she does not have any loss of bowel or bladder function to accompany and this appears to be an acute worsening of her chronic musculoskeletal back pain. 1. Back pain.  We will place the patient on p.r.n. Vicodin.  She was     encouraged to try oral medicines first and then if Vicodin does not     succeed, she can receive IV Dilaudid to assist with her pain.  We     will continue her metaxalone that she is on an outpatient basis.     She says that the Ultram is not helpful.  Therefore, we will not     continue this medicine.  Dr. Sharol Given, Orthopedic Service has been     called to come and see her in case she would warrant other surgical     procedures to take care of her spine.  We will consult physical     therapy to see her to see if they can help her with some issues     with mobility.  Additionally, the patient likely warrants bisphosphonate      therapy.  This could be initiated by her outpatient primary care provider. 2. Hypertension.  We will continue her benazepril and her metoprolol. 3. Paroxysmal atrial fibrillation.  She is not on Coumadin due to her     issues with gastric ulcers and bleeding.  We will continue her     aspirin and continue her amiodarone. 4. Diabetes mellitus.  We will hold her metformin  while she is here in     the hospital.  We will place her on insulin sliding scale and check     hemoglobin A1c. 5. Gastric ulcer.  We will continue her outpatient sucralfate. 6. Discoid lupus.  We will continue her hydroxychloroquine. 7. Hyperlipidemia.  We will continue her fish oil.  She has     unfortunately been intolerant to STATINS in the past. 8. Prophylaxis.  We will place the patient on subcutaneous heparin     while she is going to mostly bed bound in the hospital due to her     back pain.     Azzie Roup, MD  HR/MEDQ  D:  06/22/2010  T:  06/22/2010  Job:  [id number]  Electronically Signed by Azzie Roup MD on 06/22/2010 07:59:20 PM

## 2010-06-23 LAB — GLUCOSE, CAPILLARY: Glucose-Capillary: 127 mg/dL — ABNORMAL HIGH (ref 70–99)

## 2010-06-24 LAB — URINE CULTURE: Culture  Setup Time: 201206102033

## 2010-06-24 LAB — GLUCOSE, CAPILLARY
Glucose-Capillary: 83 mg/dL (ref 70–99)
Glucose-Capillary: 85 mg/dL (ref 70–99)
Glucose-Capillary: 95 mg/dL (ref 70–99)

## 2010-06-25 ENCOUNTER — Inpatient Hospital Stay (HOSPITAL_COMMUNITY): Payer: Medicare Other

## 2010-06-25 DIAGNOSIS — M519 Unspecified thoracic, thoracolumbar and lumbosacral intervertebral disc disorder: Secondary | ICD-10-CM

## 2010-06-25 LAB — GLUCOSE, CAPILLARY: Glucose-Capillary: 124 mg/dL — ABNORMAL HIGH (ref 70–99)

## 2010-06-25 LAB — BASIC METABOLIC PANEL
BUN: 25 mg/dL — ABNORMAL HIGH (ref 6–23)
Chloride: 99 mEq/L (ref 96–112)
Creatinine, Ser: 1.01 mg/dL (ref 0.4–1.2)
GFR calc Af Amer: >60 mL/min (ref >60)

## 2010-06-25 LAB — CBC
MCH: 29.8 pg (ref 26.0–34.0)
MCV: 89.5 fL (ref 78.0–100.0)
Platelets: 160 10*3/uL (ref 150–400)
RBC: 4.39 MIL/uL (ref 3.87–5.11)

## 2010-06-26 LAB — CBC
HCT: 39.1 % (ref 36.0–46.0)
Hemoglobin: 13.1 g/dL (ref 12.0–15.0)
MCH: 29.6 pg (ref 26.0–34.0)
MCHC: 33.5 g/dL (ref 30.0–36.0)
MCV: 88.5 fL (ref 78.0–100.0)
Platelets: 168 K/uL (ref 150–400)
RBC: 4.42 MIL/uL (ref 3.87–5.11)
RDW: 14.8 % (ref 11.5–15.5)
WBC: 5.8 K/uL (ref 4.0–10.5)

## 2010-06-26 LAB — GLUCOSE, CAPILLARY
Glucose-Capillary: 114 mg/dL — ABNORMAL HIGH (ref 70–99)
Glucose-Capillary: 129 mg/dL — ABNORMAL HIGH (ref 70–99)
Glucose-Capillary: 129 mg/dL — ABNORMAL HIGH (ref 70–99)
Glucose-Capillary: 92 mg/dL (ref 70–99)

## 2010-06-26 LAB — C-REACTIVE PROTEIN: CRP: 2.8 mg/dL — ABNORMAL HIGH (ref <0.6)

## 2010-06-27 ENCOUNTER — Inpatient Hospital Stay (HOSPITAL_COMMUNITY): Payer: Medicare Other

## 2010-06-27 ENCOUNTER — Ambulatory Visit: Payer: Medicare Other | Admitting: Physical Therapy

## 2010-06-27 LAB — GLUCOSE, CAPILLARY

## 2010-06-28 LAB — BASIC METABOLIC PANEL
CO2: 31 mEq/L (ref 19–32)
Chloride: 101 mEq/L (ref 96–112)
Potassium: 3.6 mEq/L (ref 3.5–5.1)
Sodium: 137 mEq/L (ref 135–145)

## 2010-06-28 LAB — GLUCOSE, CAPILLARY
Glucose-Capillary: 104 mg/dL — ABNORMAL HIGH (ref 70–99)
Glucose-Capillary: 98 mg/dL (ref 70–99)
Glucose-Capillary: 151 mg/dL — ABNORMAL HIGH (ref 70–99)

## 2010-06-29 LAB — CBC
Hemoglobin: 10 g/dL — ABNORMAL LOW (ref 12.0–15.0)
MCH: 29.2 pg (ref 26.0–34.0)
RBC: 3.42 MIL/uL — ABNORMAL LOW (ref 3.87–5.11)

## 2010-06-29 LAB — GLUCOSE, CAPILLARY
Glucose-Capillary: 112 mg/dL — ABNORMAL HIGH (ref 70–99)
Glucose-Capillary: 95 mg/dL (ref 70–99)

## 2010-06-30 DIAGNOSIS — M519 Unspecified thoracic, thoracolumbar and lumbosacral intervertebral disc disorder: Secondary | ICD-10-CM

## 2010-06-30 LAB — BASIC METABOLIC PANEL
BUN: 14 mg/dL (ref 6–23)
CO2: 27 mEq/L (ref 19–32)
Chloride: 103 mEq/L (ref 96–112)
Creatinine, Ser: 0.75 mg/dL (ref 0.50–1.10)
GFR calc Af Amer: >60 mL/min (ref >60)
Potassium: 3.4 mEq/L — ABNORMAL LOW (ref 3.5–5.1)

## 2010-06-30 LAB — GLUCOSE, CAPILLARY
Glucose-Capillary: 100 mg/dL — ABNORMAL HIGH (ref 70–99)
Glucose-Capillary: 111 mg/dL — ABNORMAL HIGH (ref 70–99)
Glucose-Capillary: 111 mg/dL — ABNORMAL HIGH (ref 70–99)
Glucose-Capillary: 89 mg/dL (ref 70–99)

## 2010-07-01 ENCOUNTER — Ambulatory Visit: Payer: Medicare Other | Admitting: Family Medicine

## 2010-07-01 ENCOUNTER — Telehealth: Payer: Self-pay | Admitting: Family Medicine

## 2010-07-01 LAB — CULTURE, BLOOD (ROUTINE X 2): Culture  Setup Time: 201206132328

## 2010-07-01 NOTE — Telephone Encounter (Signed)
Notified [facility] Nurse to let her know that Dr. Valetta Close was not aware pt had been in the hosp and because she does have a history of GI bleed, she was going to forward all info on to Dr. Carlean Purl pt's GI specialist to handle.  LMOM of personal cell for the nurse. Morene Rankins, LPN Lynne [facility]

## 2010-07-01 NOTE — Telephone Encounter (Signed)
I was not aware that pt was in the hospital and am not sure if she was put on any blood thinners such as Lovenox for DVT prophylaxis.  She has hx of GI bleeding.  I do not have any appt slots today.  Will forward to Dr Carlean Purl who she sees for GI.

## 2010-07-01 NOTE — Telephone Encounter (Signed)
Home health nurse with [facility] called and said she is seeing pt today post hosp discharge for evaluation.  Pt complained she had gone to bathroom and wiped and found bright red blood on the tissue.  Could not describe the amount.  Hospitalized for diskitis and UTI.  Please advise.  Home health nurse will be there for another 1-1 1/2 hours.  She would like a call before she leaves the pt. Plan:  Routed to Dr. Burnett Harry, LPN [facility]ne Logan

## 2010-07-02 NOTE — Telephone Encounter (Signed)
Please assess via phone and triage appropriately

## 2010-07-03 NOTE — Telephone Encounter (Signed)
I have left a message for the patient to call back to get an assessment on her symptoms. She has not been seen in the office with Dr Carlean Purl since 2008.

## 2010-07-04 ENCOUNTER — Other Ambulatory Visit (HOSPITAL_COMMUNITY): Payer: Self-pay | Admitting: Interventional Radiology

## 2010-07-04 NOTE — Telephone Encounter (Signed)
The patient has not returned my call.  I did speak with the nurse. The patient was started on a ASA and she has since stopped it.  The nurse Kenney Houseman  reports that it was a small amount of blood on the tissue only.  I have asked them to have the patient set up an appt if she has continued rectal bleeding.  She states she will pass it along to the primary nurse  Al that will be continuing to see the patient.

## 2010-07-05 ENCOUNTER — Inpatient Hospital Stay (HOSPITAL_COMMUNITY)
Admission: EM | Admit: 2010-07-05 | Discharge: 2010-07-09 | DRG: 552 | Disposition: A | Discharge status: Skilled Nursing Facility | Payer: Medicare Other | Attending: Internal Medicine | Admitting: Internal Medicine

## 2010-07-05 DIAGNOSIS — E119 Type 2 diabetes mellitus without complications: Secondary | ICD-10-CM | POA: Diagnosis present

## 2010-07-05 DIAGNOSIS — K219 Gastro-esophageal reflux disease without esophagitis: Secondary | ICD-10-CM | POA: Diagnosis present

## 2010-07-05 DIAGNOSIS — Z8711 Personal history of peptic ulcer disease: Secondary | ICD-10-CM

## 2010-07-05 DIAGNOSIS — M8448XA Pathological fracture, other site, initial encounter for fracture: Secondary | ICD-10-CM | POA: Diagnosis present

## 2010-07-05 DIAGNOSIS — Z7982 Long term (current) use of aspirin: Secondary | ICD-10-CM

## 2010-07-05 DIAGNOSIS — I10 Essential (primary) hypertension: Secondary | ICD-10-CM | POA: Diagnosis present

## 2010-07-05 DIAGNOSIS — IMO0002 Reserved for concepts with insufficient information to code with codable children: Secondary | ICD-10-CM

## 2010-07-05 DIAGNOSIS — E876 Hypokalemia: Secondary | ICD-10-CM | POA: Diagnosis present

## 2010-07-05 DIAGNOSIS — Z888 Allergy status to other drugs, medicaments and biological substances status: Secondary | ICD-10-CM

## 2010-07-05 DIAGNOSIS — I4891 Unspecified atrial fibrillation: Secondary | ICD-10-CM | POA: Diagnosis present

## 2010-07-05 DIAGNOSIS — M519 Unspecified thoracic, thoracolumbar and lumbosacral intervertebral disc disorder: Principal | ICD-10-CM | POA: Diagnosis present

## 2010-07-05 DIAGNOSIS — L930 Discoid lupus erythematosus: Secondary | ICD-10-CM | POA: Diagnosis present

## 2010-07-05 DIAGNOSIS — R197 Diarrhea, unspecified: Secondary | ICD-10-CM | POA: Diagnosis present

## 2010-07-05 LAB — BASIC METABOLIC PANEL
BUN: 14 mg/dL (ref 6–23)
Glucose, Bld: 101 mg/dL — ABNORMAL HIGH (ref 70–99)
Sodium: 142 mEq/L (ref 135–145)

## 2010-07-05 LAB — PROTIME-INR
INR: 1.06 (ref 0.00–1.49)
Prothrombin Time: 14 seconds (ref 11.6–15.2)

## 2010-07-05 LAB — DIFFERENTIAL
Basophils Absolute: 0 10*3/uL (ref 0.0–0.1)
Eosinophils Absolute: 0 10*3/uL (ref 0.0–0.7)
Eosinophils Relative: 0 % (ref 0–5)
Neutrophils Relative %: 81 % — ABNORMAL HIGH (ref 43–77)

## 2010-07-05 LAB — APTT: aPTT: 31 seconds (ref 24–37)

## 2010-07-05 LAB — GLUCOSE, CAPILLARY: Glucose-Capillary: 106 mg/dL — ABNORMAL HIGH (ref 70–99)

## 2010-07-05 LAB — CBC
Platelets: 224 10*3/uL (ref 150–400)
RBC: 3.81 MIL/uL — ABNORMAL LOW (ref 3.87–5.11)
RDW: 14.7 % (ref 11.5–15.5)
WBC: 8.6 10*3/uL (ref 4.0–10.5)

## 2010-07-06 LAB — COMPREHENSIVE METABOLIC PANEL
AST: 12 U/L (ref 0–37)
Alkaline Phosphatase: 57 U/L (ref 39–117)
BUN: 18 mg/dL (ref 6–23)
CO2: 24 mEq/L (ref 19–32)
Chloride: 106 mEq/L (ref 96–112)
Creatinine, Ser: 0.85 mg/dL (ref 0.50–1.10)
GFR calc non Af Amer: >60 mL/min (ref >60)
Potassium: 3.4 mEq/L — ABNORMAL LOW (ref 3.5–5.1)
Total Bilirubin: 0.2 mg/dL — ABNORMAL LOW (ref 0.3–1.2)

## 2010-07-06 LAB — CBC
HCT: 28.1 % — ABNORMAL LOW (ref 36.0–46.0)
MCV: 88.6 fL (ref 78.0–100.0)
Platelets: 193 10*3/uL (ref 150–400)
RBC: 3.17 MIL/uL — ABNORMAL LOW (ref 3.87–5.11)
WBC: 5.7 10*3/uL (ref 4.0–10.5)

## 2010-07-06 LAB — GLUCOSE, CAPILLARY: Glucose-Capillary: 95 mg/dL (ref 70–99)

## 2010-07-07 LAB — CBC
HCT: 31.8 % — ABNORMAL LOW (ref 36.0–46.0)
MCH: 29 pg (ref 26.0–34.0)
MCV: 87.8 fL (ref 78.0–100.0)
Platelets: 225 10*3/uL (ref 150–400)
RBC: 3.62 MIL/uL — ABNORMAL LOW (ref 3.87–5.11)
WBC: 6.3 10*3/uL (ref 4.0–10.5)

## 2010-07-07 LAB — GRAM STAIN

## 2010-07-07 LAB — BASIC METABOLIC PANEL
CO2: 27 mEq/L (ref 19–32)
Calcium: 8.5 mg/dL (ref 8.4–10.5)
Chloride: 96 mEq/L (ref 96–112)
Glucose, Bld: 110 mg/dL — ABNORMAL HIGH (ref 70–99)
Potassium: 2.8 mEq/L — ABNORMAL LOW (ref 3.5–5.1)
Sodium: 135 mEq/L (ref 135–145)

## 2010-07-07 LAB — GLUCOSE, CAPILLARY
Glucose-Capillary: 131 mg/dL — ABNORMAL HIGH (ref 70–99)
Glucose-Capillary: 106 mg/dL — ABNORMAL HIGH (ref 70–99)
Glucose-Capillary: 102 mg/dL — ABNORMAL HIGH (ref 70–99)

## 2010-07-07 LAB — MAGNESIUM: Magnesium: 1.2 mg/dL — ABNORMAL LOW (ref 1.5–2.5)

## 2010-07-07 NOTE — Telephone Encounter (Signed)
Agree 

## 2010-07-08 LAB — BASIC METABOLIC PANEL
BUN: 9 mg/dL (ref 6–23)
GFR calc non Af Amer: >60 mL/min (ref >60)
Glucose, Bld: 99 mg/dL (ref 70–99)
Potassium: 3.4 mEq/L — ABNORMAL LOW (ref 3.5–5.1)

## 2010-07-08 LAB — CBC
HCT: 29.7 % — ABNORMAL LOW (ref 36.0–46.0)
Hemoglobin: 9.9 g/dL — ABNORMAL LOW (ref 12.0–15.0)
MCHC: 33.3 g/dL (ref 30.0–36.0)

## 2010-07-08 LAB — GLUCOSE, CAPILLARY: Glucose-Capillary: 92 mg/dL (ref 70–99)

## 2010-07-08 LAB — FECAL LACTOFERRIN, QUANT: Fecal Lactoferrin: NEGATIVE

## 2010-07-09 LAB — BASIC METABOLIC PANEL
BUN: 10 mg/dL (ref 6–23)
CO2: 26 mEq/L (ref 19–32)
Chloride: 103 mEq/L (ref 96–112)
Creatinine, Ser: 0.63 mg/dL (ref 0.50–1.10)

## 2010-07-09 LAB — CBC
HCT: 29.2 % — ABNORMAL LOW (ref 36.0–46.0)
Hemoglobin: 9.6 g/dL — ABNORMAL LOW (ref 12.0–15.0)
MCV: 87.4 fL (ref 78.0–100.0)
RBC: 3.34 MIL/uL — ABNORMAL LOW (ref 3.87–5.11)
WBC: 2.7 10*3/uL — ABNORMAL LOW (ref 4.0–10.5)

## 2010-07-09 NOTE — Discharge Summary (Signed)
Yvonne, Wise              ACCOUNT NO.:  [id number]  MEDICAL RECORD NO.:  [id number]  LOCATION:  [id number]                         FACILITY:  [facility]  PHYSICIAN:  Sheila Oats, M.D.DATE OF BIRTH:  March 21, 1932  DATE OF ADMISSION:  06/22/2010 DATE OF DISCHARGE:  06/30/2010                        DISCHARGE SUMMARY - REFERRING   DISCHARGE DIAGNOSES: 1. Probable diskitis. 2. E-coli urinary tract infection. 3. L1 compression fracture -- follow up outpatient with Dr. Sharol Given. 4. Hypokalemia -- potassium replaced. 5. Diabetes mellitus, type 2. 6. History of paroxysmal atrial fibrillation -- not a Coumadin     candidate due to history of GI bleed/gastric ulcers. 7. Discoid lupus. 8. Hyperlipidemia. 9. History of gastric ulcers. 10.Hypertension.  PROCEDURES AND STUDIES: 1. MRI of the lumbar spine on June 21, 2010 -- transitional anatomy at     the lumbosacral junction.  L1 compression fracture appears new     since prior films, but there is only mildly increased signal within     the vertebral body on the STIR sequence.  Markedly decreased T1 and     T2 signal throughout the L1 vertebral body is compatible with     sclerosis.  Finding may be related to compression fracture and     degenerative disease.  Other causes of bony sclerosis including     metastatic disease or lymphoma cannot be excluded.  Postoperative     change on the right at L4-L5 anterior listhesis results in severe     right and moderately severe left foraminal narrowing with     encroachment on both exiting L4 roots.  As noted above, the L4 root     appears compressed within the foramen.  Facet arthropathy causes     narrowing of the left lateral recess at this level.  Moderately     severe-to-severe central canal stenosis L5-S1 where there is also     advanced facet arthropathy.  Status post right laminotomy at L3-L4.     Central canal is open.  Mild-to-moderate right foraminal narrowing     noted.  Remote  compression fracture at L5. 2. Aspiration at T12-L1 on June 27, 2010 per Dr. Estanislado Pandy.  CONSULTATIONS: 1. Orthopedics -- Dr. Sharol Given. 2. Infectious Disease -- Dr. Johnnye Sima. 3. Interventional Radiology.  BRIEF HISTORY:  The patient is a 77 year old white female with the above- listed medical problems who presented with complaints of back pain.  She stated that the pain was in her lower back, radiating down to her toes on both sides.  She reported some mild numbness in her feet and stated that this had been present for 3-4 years.  She was seen in the ED, and it was noted that her Orthopedic surgeon, Dr. Sharol Given had ordered an MRI of her spine today prior to admission.  She was admitted for further evaluation and management.  HOSPITAL COURSE: 1. Probable diskitis -- upon admission, Dr. Sharol Given was consulted to see     the patient and following his evaluation, he recommended a steroid     injection per Interventional Radiology and Dr. Barbie Banner reviewed the     patient's films and at that time, he indicated that she had  findings that were consistent with diskitis on the MRI.  Infectious     Disease was consulted and they recommended to hold the antibiotics     that the patient had been on for urinary tract infection for about     48 hours and then to have an aspirate from that area to be sent for     cultures.  The patient's antibiotics were held, and the patient had     a disk aspirate done per Interventional Radiology on June 27, 2010.     Infectious disease -- Dr. Johnnye Sima saw the patient and started her     on empiric antibiotics with Rocephin and vancomycin, pending the     aspirate cultures.  To date, the aspirate is growing no bacteria.     Infectious Disease followed up with the patient and recommended     that she be empirically treated with vancomycin and Rocephin for 4-     6 weeks.  The patient had a PICC line placed, and she will be     discharged on IV antibiotics at this time.  She  is to follow up     with Infectious Disease outpatient for further monitoring and     management as appropriate.  The patient's blood cultures have     remained negative. 2. L1 compression fracture -- noted on MRI of June 21, 2010.  She was     placed on analgesics for pain management during this hospital stay     and Dr. Sharol Given saw her in the hospital and is to follow up with her     outpatient. 3. E-coli urinary tract infection -- the patient had an urinalysis on     admission, which was consistent with the UTI, and she was started     on empiric antibiotics and urine cultures were done and grew E-coli     that was pansensitive.  She has received adequate antibiotics for     urinary tract infection also as noted above, she has been     discharged on antibiotics for her diskitis. 4. Diabetes mellitus -- Accu-Cheks were monitored, and she was covered     with sliding scale during her hospital stay.  She is to continue     her metformin upon discharge. 5. Paroxysmal atrial fibrillation -- the patient was maintained on     amiodarone during this hospital stay as well as aspirin, and she is     to continue them upon discharge.  As noted above, she is not a     Coumadin candidate secondary to her prior history of GI bleed with     gastric ulcers. 6. Hypertension -- the patient was maintained on her outpatient     medications during this hospital stay and is to continue them upon     discharge. 7. History of discoid lupus -- the patient is to continue her     Plaquenil upon discharge.  DISCHARGE MEDICATIONS: 1. Rocephin 1 g IV daily for 78 more days. 2. Vancomycin 750 mg IV b.i.d. for 38 more days. 3. Vicodin 1-2 tablets q.4 h. p.r.n. 4. Amiodarone 200 mg p.o. daily. 5. Aspirin 81 mg p.o. q.a.m. 6. Donepezil 40 mg p.o. daily. 7. Cymbalta 60 mg p.o. q.a.m. 8. Fish oil 1000 mg 2 capsules p.o. b.i.d. 9. Hydroxychloroquine 200 mg one p.o. daily. 10.Iron 65 mg p.o. daily. 11.Metaxalone 800  mg p.o. t.i.d. 12.Metformin 500 mg 1 p.o. b.i.d. 13.Metoprolol tartrate 50 mg  p.o. q.a.m. 14.Oxybutynin 5 mg p.o. b.i.d. 15.Sucralfate 1 g p.o. b.i.d. 16.Super calcium 600 plus D3 over-the-counter one p.o. b.i.d.  DISCONTINUED MEDICATIONS:  Tramadol.  FOLLOW-UP CARE: 1. Primary care physician in 1 week. 2. Dr. Sharol Given, Orthopedics in 1-2 weeks. 3. Dr. Hatcher/In[facility] Disease [facility] in 2 weeks, call [phone] for     appointment.  DISCHARGE CONDITION:  Improved/stable.     Sheila Oats, M.D.     ACV/MEDQ  D:  06/30/2010  T:  06/30/2010  Job:  [id number]  cc:   Newt Minion, MD Doroteo Bradford Johnnye Sima, M.D. Loyal Gambler, D.O.  Electronically Signed by Minette Headland M.D. on 07/09/2010 07:32:42 AM

## 2010-07-11 LAB — STOOL CULTURE

## 2010-07-17 NOTE — H&P (Signed)
NAMEMADDEN, PELZEL NO.:  [id number]  MEDICAL RECORD NO.:  [id number]  LOCATION:  [facility]                         FACILITY:  [facility]  PHYSICIAN:  Verlee Monte, MD       DATE OF BIRTH:  December 15, 1932  DATE OF ADMISSION:  07/05/2010 DATE OF DISCHARGE:                             HISTORY & PHYSICAL   PRIMARY CARE PHYSICIAN:  Yvonne Gambler, DO  REASON FOR ADMISSION:  Intractable back pain.  BRIEF HISTORY:  [name] is a 77 year old Caucasian female with past medical history of diabetes mellitus type 2, discoid lupus and hypertension.  The patient was recently discharged from the hospital after she stayed from June 22, 2010 to June 30, 2010 for probable diskitis, UTI and L1 fracture.  The patient was discharged home to be on long-term IV antibiotics for the diskitis.  The patient came into the hospital today complaining about intractable back pain.  The patient could not do her activities of daily livings with this pain.  Also, the patient had some problems with administering the IV antibiotics this morning.  The pain located in the lower back, radiates to the bilateral lower extremity.  Characterized as deep and dull.  The patient's pain is 9/10 and improved with pain medications and aggravated by movement sitting and standing.  Upon initial evaluation in the emergency department, the patient is afebrile.  Her blood work looks okay apart from mild hypokalemia.  The patient admitted for further evaluation.  PAST MEDICAL HISTORY: 1. Hypertension. 2. Discoid lupus. 3. Atrial fibrillation. 4. Diabetes mellitus. 5. Recently diagnosed diskitis. 6. Recently diagnosed L1 fracture. 7. Hyperlipidemia. 8. History of gastric ulcers. 9. Hypertension.  ALLERGIES:  STATINS, NSAIDS AND OXYCODONE.  HOME MEDICATIONS: 1. Rocephin 1 g q.24 hours. 2. Hydrocodone/APAP 5/325 one to two tablets every 4 hours as needed     for pain. 3. Vancomycin 750 mg b.i.d. 4. Amiodarone 200  mg p.o. daily. 5. Aspirin 81 mg p.o. daily. 6. Benazepril 40 mg p.o. daily. 7. Cymbalta 60 mg p.o. daily. 8. Fish oil 1000 mg 2 capsules p.o. b.i.d. 9. Hydroxychloroquine 200 mg p.o. daily. 10.Iron 65 mg p.o. daily. 11.Metaxalone 800 mg p.o. t.i.d. 12.Metformin 500 mg p.o. b.i.d. 13.Metoprolol tartrate 50 mg p.o. daily. 14.Oxybutynin 5 mg p.o. b.i.d. 15.Sucralfate 1 g p.o. b.i.d. 16.Super calcium 600 D3 OTC p.o. b.i.d.  PAST SURGICAL HISTORY:  Significant laminectomy at L3-L4.  SOCIAL HISTORY:  The patient from [place], lives in apartment by herself.  Denies smoking or alcohol as well as illicit drug use.  FAMILY HISTORY:  Reported father had colon cancer.  REVIEW OF SYSTEMS:  A 12-point review of systems is negative except for the symptoms as mentioned in the HPI.  PHYSICAL EXAMINATION:  VITAL SIGNS:  Temperature is 97.6; respirations 15, pulse is 88; blood pressure is 227/133 and retaking again it was 163/83.  GENERAL:  The patient is well-developed Caucasian female wearing hospital gown, lays in her back, in no acute distress, looks uncomfortable. HEENT:  Head and face normocephalic, atraumatic.  Eyes, pupils equal, reactive to light and accommodation.  ENT, mouth and pharynx normal. NECK:  Supple.  No masses.  Lumbar spine tender. CARDIOVASCULAR:  Regular rate and rhythm.  No murmurs, rubs or gallops. RESPIRATORY:  Clear to auscultation bilaterally. ABDOMEN:  Bowel sounds heard, soft, nontender or distended. EXTREMITIES:  Normal and neurovascular is intact. NEURO:  Alert, awake, oriented x3.  Motor intact in all extremities. Sensation normal. SKIN:  Color normal.  No rash.  LABS: 1. BMP sodium is 142, potassium 3.1, chloride 101, bicarb is 26,     glucose 101, BUN is 14, creatinine 0.7, calcium 9.1.  CBC, WBCs     8.6, hemoglobin 11.2, hematocrit 33.0, platelets 224. 2. PT/INR is 14.0/1.0.  PTT is 31.  ASSESSMENT AND PLAN: 1. The patient probably will need  skilled nursing facility versus     LTAC. 2. Intractable back pain.  The patient will be admitted for     observation.  Pain medication will be adjusted.  The patient is on     morphine.  We will put the patient on IV opioids as well as the     hydrocodone.  The pain is expected to be from L1 compression     fracture as well as probable diskitis. 3. Recent diagnosis of probable diskitis.  The patient is on IV     antibiotics, vancomycin, and Rocephin.  We will ask pharmacy to     start dosing these medications. 4. Hypertension.  Blood pressure seems in the high side.  We will     restart her blood pressure medications and use hydralazine IV as     p.r.n. medication to keep the systolic blood pressure less than     160. 5. History of paroxysmal atrial fibrillation.  The patient seems now     in normal sinus rhythm.  The patient is not on Coumadin because of     history of gastric ulcer and gastrointestinal bleeding.  The     patient also is on aspirin 81 mg daily. 6. Hypokalemia will replete. 7. Diabetes mellitus type 2.  The patient will be on carbohydrate     modified diet.  Start insulin sliding scale and we will restart her     metformin as well.     Verlee Monte, MD     ME/MEDQ  D:  07/05/2010  T:  07/05/2010  Job:  [id number]  cc:   [name], D.O.  Electronically Signed by Verlee Monte  on 07/17/2010 10:43:38 PM

## 2010-07-24 LAB — FUNGUS CULTURE W SMEAR: Fungal Smear: NONE SEEN

## 2010-08-06 ENCOUNTER — Ambulatory Visit (INDEPENDENT_AMBULATORY_CARE_PROVIDER_SITE_OTHER): Payer: Medicare Other | Admitting: Infectious Diseases

## 2010-08-06 ENCOUNTER — Encounter: Payer: Self-pay | Admitting: Infectious Diseases

## 2010-08-06 VITALS — HR 85 | Temp 97.5°F | Ht 63.0 in | Wt 157.0 lb

## 2010-08-06 DIAGNOSIS — M519 Unspecified thoracic, thoracolumbar and lumbosacral intervertebral disc disorder: Secondary | ICD-10-CM

## 2010-08-06 DIAGNOSIS — M4640 Discitis, unspecified, site unspecified: Secondary | ICD-10-CM | POA: Insufficient documentation

## 2010-08-06 NOTE — Progress Notes (Signed)
  Subjective:    Patient ID: [name], female    DOB: Jul 29, 1932, 77 y.o.   MRN: [id number]  HPI 77 yo F with hx of back pain, SLE and DM2 hospitalized 06-22-10 to 06-30-10 after being found on MRI to have discitis. She underwent IR guided aspirate which was Cx negative. She was d/c to [facility] for rehab,  with vanco and ceftriaxone. She has made minimal progress, non-ambulatory. Having to use a duragesic patch, more confusion. Was prev on demerol but this did not improve after being put onto the patch. She has had a repeat UTI last week. CRP 2.8 and ESR 40 while in hospital.  Today complains of pain in her knees and her back. Is scheduled to complete her IV anbx today. No problem with PIC- no problems with pain or infusions. No fever or chills.   Review of Systems     Objective:   Physical Exam  Constitutional: She appears well-developed and well-nourished. She appears distressed.  Eyes: EOM are normal. Pupils are equal, round, and reactive to light.  Neck: Neck supple.  Cardiovascular: Normal rate, regular rhythm and normal heart sounds.   Pulmonary/Chest: Effort normal and breath sounds normal.  Abdominal: Soft. Bowel sounds are normal. There is no tenderness.  Musculoskeletal:       Back:       Arms:      Legs: Lymphadenopathy:    She has no cervical adenopathy.          Assessment & Plan:

## 2010-08-06 NOTE — Assessment & Plan Note (Signed)
She will complete her IV anbx today. Her pic is in good shape. I discussed with her and her family that (as we discussed when we were in the hospital) that it is difficult to tell the changes in her MRI may be reflective of her arthritis or SLE rather than infection. I offered to repeat her MRI but due to her pain she is reluctant to do this. She would like to think about this. I will repeat her ESR and CRP (although these also can be altered by her underlying arthritides).  She will call us when she is ready for her MRI. We will pull her PIC depending on the result of this, she has limited access due to her previous mastectomy.

## 2010-08-07 LAB — C-REACTIVE PROTEIN: CRP: 9.9 mg/dL — ABNORMAL HIGH (ref <0.6)

## 2010-08-07 NOTE — Progress Notes (Signed)
Addended by: Jarrett Ables D on: 08/07/2010 12:17 PM   Modules accepted: Orders

## 2010-08-10 ENCOUNTER — Other Ambulatory Visit: Payer: Medicare Other

## 2010-08-10 LAB — AFB CULTURE WITH SMEAR (NOT AT ARMC)

## 2010-08-11 ENCOUNTER — Other Ambulatory Visit: Payer: Self-pay | Admitting: Infectious Diseases

## 2010-08-11 ENCOUNTER — Telehealth: Payer: Self-pay | Admitting: *Deleted

## 2010-08-11 ENCOUNTER — Telehealth: Payer: Self-pay | Admitting: Licensed Clinical Social Worker

## 2010-08-11 DIAGNOSIS — M4640 Discitis, unspecified, site unspecified: Secondary | ICD-10-CM

## 2010-08-11 NOTE — Telephone Encounter (Signed)
I received an approval to schedule this patients MRI, son was waiting to get an appointment time so he could take her but I was unable to reach the patient or the son. I left several messages on voicemail. I tried [phone], and [phone] including the home number that's listed on the account.

## 2010-08-14 ENCOUNTER — Ambulatory Visit
Admission: RE | Admit: 2010-08-14 | Discharge: 2010-08-14 | Disposition: A | Discharge status: Home / Self Care | Payer: Medicare Other | Source: Ambulatory Visit | Attending: Infectious Diseases | Admitting: Infectious Diseases

## 2010-08-14 DIAGNOSIS — M4640 Discitis, unspecified, site unspecified: Secondary | ICD-10-CM

## 2010-08-19 ENCOUNTER — Telehealth: Payer: Self-pay | Admitting: *Deleted

## 2010-08-19 NOTE — Telephone Encounter (Signed)
Mary from [facility] called concerned they have not been kept informed about patient's IV antibiotics and result of MRI.  They are requesting to be notified when Neurosurgery appointment is scheduled.  Also requesting for Dr. Johnnye Sima to call a Physician atttending the nursing home. Tomorrow that would be Dr. Joylene Draft or Dr. Sharlett Iles at [facility].  Phone number [phone].   She said the son is not keeping them informed.   Myrtis Hopping CMA

## 2010-08-20 ENCOUNTER — Telehealth: Payer: Self-pay | Admitting: *Deleted

## 2010-08-20 NOTE — Telephone Encounter (Signed)
Faxed order to [facility] to remove PICC line per Dr. Billie Ruddy CMA

## 2010-08-25 NOTE — Telephone Encounter (Signed)
Husband has questions about MRI.  RN to ask CMA to call the husband.

## 2010-08-26 NOTE — Discharge Summary (Signed)
[name], [name] NO.:  [id number]  MEDICAL RECORD NO.:  [id number]  LOCATION:  5501                         FACILITY:  [facility]  PHYSICIAN:  Leana Gamer, MDDATE OF BIRTH:  [birth year]  DATE OF ADMISSION:  07/05/2010 DATE OF DISCHARGE:  07/09/2010                        DISCHARGE SUMMARY - REFERRING   PRIMARY CARE PHYSICIAN:  Dr. Valetta Close from [facility].  DISCHARGE DIAGNOSES: 1. Intractable back pain. 2. Diskitis - suspected. 3. Recent L1 compression fracture. 4. Atrial fibrillation, not a Coumadin candidate secondary to     gastrointestinal bleeding. 5. Diabetes mellitus, type 2. 6. Hypokalemia, repleted. 7. Chronic diarrhea.  SECONDARY DIAGNOSES: 1. Hypertension. 2. Discoid lupus. 3. Hyperlipidemia. 4. History of peptic ulcer disease.  CONDITION AT TIME OF DISCHARGE:  [name] is alert and oriented.  She is in no apparent distress.  She is calm, is lying in bed comfortably, tells me that today her back pain is slightly worse than usual, but she is on a fentanyl patch and taking p.o. pain medications.  She has had two loose bowel movements today.  Her diarrhea is chronic in nature. She has been up with physical therapy recently and able to take a few steps to the chair, is also able to transfer to the commode.  Overall feels she is much better than she was on admission and is ready to be discharged to skilled nursing facility for short-term rehab.  HISTORY AND BRIEF HOSPITAL COURSE:  [name] is an extremely pleasant 77 year old female with a history of suspected diskitis diagnosed during hospitalization between June 22, 2010, and June 30, 2010.  She also has a history of diabetes mellitus type 2, discoid lupus, and hypertension. After her hospitalization from June 22, 2010, through June 30, 2010, she was discharged to home on IV antibiotics for diskitis.  The patient returned to the hospital on July 05, 2010, with  intractable back pain that prevented her from being able to do her activities of daily living. Also, she had some problems with administering her IV antibiotics.  She described her pain as being located in her lower back radiating bilaterally to her lower extremities.    The patient was admitted to the hospital, placed on IV pain medications for pain.  Also, she was continued on her IV antibiotics, Rocephin and vancomycin, for her diskitis.  It was determined by physical therapy and occupational therapy that she would need placement in either skilled nursing facility or long-term care facility at the time of discharge secondary to her diskitis and intractable back pain that required ongoing IV antibiotics. Over the course of several days, the patient's pain did improve somewhat, however, she does still have pain.  She did have an episode of hypokalemia, her potassium was repleted.  Also, the patient developed diarrhea during this hospitalization and upon further inquiry, it was found that the patient's diarrhea has been intermittent and chronic since her gallbladder surgery in 2006.  Stool cultures were obtained. She was found to have reduced normal flora and the patient is currently on probiotics.  Further, she had a fecal lactoferrin that was negative and a C. difficile by PCR that was negative as well.  Gram  stain of her stools showed abundant yeast.  PHYSICAL EXAMINATION ON THE DAY OF DISCHARGE:  GENERAL:  The patient is alert and oriented.  She is very pleasant to speak with. VITAL SIGNS:  Temperature is 97.9, pulse 85, respirations 18, blood pressure is 140/74, O2 saturation is 94% on room air. HEENT:  Head is atraumatic, normocephalic.  Eyes are anicteric with pupils that are equal and round.  Nose shows no nasal discharge or exterior lesions.  Mouth has moist mucous membranes. NECK:  Supple with midline trachea.  No JVD.  No lymphadenopathy. CHEST:  Demonstrates no accessory  muscle use.  She has no wheezes or crackles to my exam. HEART:  Regular rate and rhythm without obvious murmurs, rubs, or gallops. ABDOMEN:  Soft, nontender, nondistended with active bowel sounds. PSYCHIATRIC:  The patient is alert and oriented.  Demeanor is pleasant, cooperative.  Grooming is good. SKIN:  There are no rashes, bruises, or lesions. BACK:  Further, her back was palpated.  She was tender to palpation in the L4-L5 area.  LABORATORY DATA:  On July 09, 2010, the patient's WBC is 2.7, hemoglobin 9.6, hematocrit 29.2, platelets 201.  Sodium 138, potassium 3.8, chloride 103, bicarb 26, glucose 96, BUN 10, creatinine 0.63, calcium 8.5.  There were no radiological exams on this admission.  DISCHARGE MEDICATIONS: 1. Fentanyl patch 50 mcg per hour transdermally, change every 3 days. 2. Florastor 250 mg by mouth twice daily. 3. Senna/docusate sodium 8.5/50 mg 2 tablets by mouth daily at bedtime     as needed for constipation, hold for diarrhea. 4. Amiodarone 200 mg 1 tablet by mouth daily. 5. Benazepril 40 mg 1 tablet by mouth every evening. 6. Calcium carbonate 1 tablet by mouth twice daily. 7. Cymbalta 60 mg 1 capsule by mouth every evening. 8. Ceftriaxone 1 g injection intravenously q.24 h. take daily until     August 06, 2010. 9. Fish oil 1000 mg 2 capsules by mouth twice daily. 10.Hydroxychloroquine 200 mg 1 tablet every morning. 11.Iron 65 mg 1 tablet every morning. 12.Loperamide 2 mg 2 capsules by mouth four times a day as needed for     loose stools. 13.Meperidine 50 mg 1 tablet by mouth every 6 hours as needed for     pain. 14.Metaxalone 800 mg 1 tablet by mouth three times daily. 15.Metformin 500 mg 1 tablet by mouth twice daily. 16.Metoprolol 50 mg 1 tablet by mouth every morning. 17.Oxybutynin 5 mg 1 tablet by mouth twice daily. 18.Sucralfate 1 g 1 tablet by mouth twice daily. 19.Vancomycin 1000 mg injection 750 mg in 150 mL per hour     intravenously, infused  twice daily each day until August 06, 2010.  DISCHARGE INSTRUCTIONS:  The patient is being discharged to a skilled nursing facility, specifically [facility].  Activity will be per physical therapy.  DIET:  She will be on a diabetic diet as tolerated.  WOUND CARE:  Not applicable.  FOLLOWUP APPOINTMENTS:  She needs to be seen by Dr. Sharol Given of Orthopedics during the week of July 13, 2010.  She is to be seen by Dr. Johnnye Sima of Infectious Disease also during the week of July 13, 2010.  Please call [phone] for an appointment with Dr. Johnnye Sima.  Considerations for outpatient followup: if the pain continues and is not controlled with fentanyl and Demerol and a Lidoderm patch may be considered.     Melton Alar, PA   ______________________________ Leana Gamer, MD    MLY/MEDQ  D:  07/09/2010  T:  07/09/2010  Job:  [id number]  cc:   Dr. Valetta Close from [facility]  Electronically Signed by Imogene Burn PA on 07/29/2010 [id number] PM Electronically Signed by Liston Alba MD on 08/26/2010 08:40:23 PM

## 2010-08-29 ENCOUNTER — Telehealth: Payer: Self-pay | Admitting: *Deleted

## 2010-08-29 NOTE — Telephone Encounter (Signed)
Received call from patients daughter.  Family has requested records from [place] where she had back surgery in 2005 and 2008.  Records are in storage and they requested I call [facility] records to see if a rush can be put on this so patient can be referred to [facility].  Spoke with Graylon Good in Medical Records and they should receive by early next week.  Son Liliane Channel will be called and he will pick up records in person. # [phone] [place]

## 2010-09-05 ENCOUNTER — Telehealth: Payer: Self-pay | Admitting: *Deleted

## 2010-09-05 NOTE — Telephone Encounter (Signed)
Notified [facility]) that patient has appointment at [facility] for 09/10/10 at 8:20 AM. Phone # for [facility] [phone]. Also left message for son Zanaiya Kerzner to call back for appointment information. Myrtis Hopping CMA

## 2010-09-17 ENCOUNTER — Other Ambulatory Visit: Payer: Self-pay | Admitting: Neurosurgery

## 2010-09-17 DIAGNOSIS — T148XXA Other injury of unspecified body region, initial encounter: Secondary | ICD-10-CM

## 2010-09-17 MED ORDER — MIDAZOLAM HCL 2 MG/2ML IJ SOLN: 1.0000 mg | INTRAMUSCULAR | Status: DC | PRN

## 2010-09-17 MED ORDER — FENTANYL CITRATE 0.05 MG/ML IJ SOLN: 25.0000 ug | INTRAMUSCULAR | Status: DC | PRN

## 2010-09-18 ENCOUNTER — Other Ambulatory Visit: Payer: Self-pay | Admitting: Neurosurgery

## 2010-09-18 ENCOUNTER — Inpatient Hospital Stay
Admission: RE | Admit: 2010-09-18 | Discharge: 2010-09-18 | Payer: Medicare Other | Source: Ambulatory Visit | Attending: Neurosurgery | Admitting: Neurosurgery

## 2010-09-18 DIAGNOSIS — Z780 Asymptomatic menopausal state: Secondary | ICD-10-CM

## 2010-09-24 ENCOUNTER — Ambulatory Visit
Admission: RE | Admit: 2010-09-24 | Discharge: 2010-09-24 | Disposition: A | Discharge status: Home / Self Care | Payer: Medicare Other | Source: Ambulatory Visit | Attending: Neurosurgery | Admitting: Neurosurgery

## 2010-09-24 DIAGNOSIS — Z780 Asymptomatic menopausal state: Secondary | ICD-10-CM

## 2010-09-25 ENCOUNTER — Other Ambulatory Visit: Payer: Self-pay | Admitting: Neurosurgery

## 2010-09-25 ENCOUNTER — Ambulatory Visit
Admission: RE | Admit: 2010-09-25 | Discharge: 2010-09-25 | Disposition: A | Discharge status: Home / Self Care | Payer: Medicare Other | Source: Ambulatory Visit | Attending: Neurosurgery | Admitting: Neurosurgery

## 2010-09-25 ENCOUNTER — Other Ambulatory Visit: Payer: Self-pay | Admitting: Diagnostic Radiology

## 2010-09-25 VITALS — BP 140/66 | HR 109 | Temp 96.1°F | Resp 16 | Ht 62.0 in | Wt 166.0 lb

## 2010-09-25 DIAGNOSIS — M4640 Discitis, unspecified, site unspecified: Secondary | ICD-10-CM

## 2010-09-25 DIAGNOSIS — T148XXA Other injury of unspecified body region, initial encounter: Secondary | ICD-10-CM

## 2010-09-25 MED ORDER — FENTANYL CITRATE 0.05 MG/ML IJ SOLN
25.0000 ug | INTRAMUSCULAR | Status: DC | PRN
  Administered 2010-09-25 (×3): 50 ug via INTRAVENOUS

## 2010-09-25 MED ORDER — MIDAZOLAM HCL 2 MG/2ML IJ SOLN
1.0000 mg | INTRAMUSCULAR | Status: DC | PRN
  Administered 2010-09-25 (×2): 1 mg via INTRAVENOUS

## 2010-09-25 MED ORDER — SODIUM CHLORIDE 0.9 % IV SOLN
Freq: Once | INTRAVENOUS | Status: AC
  Administered 2010-09-25: 08:00:00 via INTRAVENOUS

## 2010-09-25 MED ORDER — KETOROLAC TROMETHAMINE 30 MG/ML IJ SOLN: 30.0000 mg | Freq: Once | INTRAMUSCULAR | Status: DC

## 2010-09-25 NOTE — Discharge Instructions (Signed)
Biopsy Post Procedure Discharge Instructions  1. May resume a regular diet and any medications that you routinely take (including pain medications). 2. No driving day of procedure. 3. Upon discharge go home and rest for at least 4 hours.  May use an ice pack as needed to injection sites on back.    Please contact our office at [phone] for the following symptoms:   Fever greater than 100 degrees  Increased swelling, pain, or redness at injection site.   Thank you for visiting [facility].  May remove bandaids later today.

## 2010-09-25 NOTE — Progress Notes (Signed)
Pt states she is a pre-diabetic, cbg this am 101. History reviewed and noted. Lungs clear and heart sounds are rapid and regular. Smoking history reviewed. 9:06 back to nursing area, pt comfortable and taking po's well. Left shoulder more comfortable at this time. Pt will be ready for d/c about 10:15 am.

## 2010-09-28 LAB — BODY FLUID CULTURE

## 2010-10-06 ENCOUNTER — Other Ambulatory Visit (HOSPITAL_COMMUNITY): Payer: Self-pay | Admitting: Neurosurgery

## 2010-10-06 DIAGNOSIS — M48061 Spinal stenosis, lumbar region without neurogenic claudication: Secondary | ICD-10-CM

## 2010-11-10 LAB — AFB CULTURE WITH SMEAR (NOT AT ARMC): Acid Fast Smear: NONE SEEN

## 2010-11-11 ENCOUNTER — Ambulatory Visit (HOSPITAL_COMMUNITY): Admission: RE | Admit: 2010-11-11 | Payer: Medicare Other | Source: Ambulatory Visit

## 2010-11-11 ENCOUNTER — Ambulatory Visit (HOSPITAL_COMMUNITY)
Admission: RE | Admit: 2010-11-11 | Discharge: 2010-11-11 | Disposition: A | Discharge status: Home / Self Care | Payer: Medicare Other | Source: Ambulatory Visit | Attending: Neurosurgery | Admitting: Neurosurgery

## 2010-11-11 DIAGNOSIS — M48061 Spinal stenosis, lumbar region without neurogenic claudication: Secondary | ICD-10-CM | POA: Insufficient documentation

## 2010-11-11 DIAGNOSIS — Z538 Procedure and treatment not carried out for other reasons: Secondary | ICD-10-CM | POA: Insufficient documentation

## 2010-11-11 DIAGNOSIS — Z01818 Encounter for other preprocedural examination: Secondary | ICD-10-CM | POA: Insufficient documentation

## 2010-11-11 DIAGNOSIS — Z01812 Encounter for preprocedural laboratory examination: Secondary | ICD-10-CM | POA: Insufficient documentation

## 2010-11-11 LAB — GLUCOSE, CAPILLARY: Glucose-Capillary: 95 mg/dL (ref 70–99)

## 2010-11-18 ENCOUNTER — Other Ambulatory Visit: Payer: Self-pay | Admitting: Neurosurgery

## 2010-11-18 DIAGNOSIS — M4800 Spinal stenosis, site unspecified: Secondary | ICD-10-CM

## 2010-11-18 DIAGNOSIS — M549 Dorsalgia, unspecified: Secondary | ICD-10-CM

## 2010-11-18 DIAGNOSIS — M5410 Radiculopathy, site unspecified: Secondary | ICD-10-CM

## 2010-11-21 ENCOUNTER — Ambulatory Visit
Admission: RE | Admit: 2010-11-21 | Discharge: 2010-11-21 | Disposition: A | Discharge status: Home / Self Care | Payer: Medicare Other | Source: Ambulatory Visit | Attending: Neurosurgery | Admitting: Neurosurgery

## 2010-11-21 DIAGNOSIS — M4800 Spinal stenosis, site unspecified: Secondary | ICD-10-CM

## 2010-11-21 DIAGNOSIS — M4640 Discitis, unspecified, site unspecified: Secondary | ICD-10-CM

## 2010-11-21 DIAGNOSIS — M549 Dorsalgia, unspecified: Secondary | ICD-10-CM

## 2010-11-21 DIAGNOSIS — M5410 Radiculopathy, site unspecified: Secondary | ICD-10-CM

## 2010-11-21 DIAGNOSIS — M5137 Other intervertebral disc degeneration, lumbosacral region: Secondary | ICD-10-CM

## 2010-11-21 MED ORDER — ONDANSETRON HCL 4 MG/2ML IJ SOLN: 4.0000 mg | Freq: Four times a day (QID) | INTRAMUSCULAR | Status: DC | PRN

## 2010-11-21 MED ORDER — MEPERIDINE HCL 100 MG/ML IJ SOLN
75.0000 mg | Freq: Once | INTRAMUSCULAR | Status: AC
  Administered 2010-11-21: 75 mg via INTRAMUSCULAR

## 2010-11-21 MED ORDER — DIAZEPAM 5 MG PO TABS
5.0000 mg | ORAL_TABLET | Freq: Once | ORAL | Status: AC
  Administered 2010-11-21: 5 mg via ORAL

## 2010-11-21 MED ORDER — ONDANSETRON HCL 4 MG/2ML IJ SOLN
4.0000 mg | Freq: Once | INTRAMUSCULAR | Status: AC
  Administered 2010-11-21: 4 mg via INTRAMUSCULAR

## 2010-11-21 MED ORDER — IOHEXOL 180 MG/ML  SOLN
20.0000 mL | Freq: Once | INTRAMUSCULAR | Status: AC | PRN
  Administered 2010-11-21: 20 mL via INTRATHECAL

## 2010-11-21 NOTE — Patient Instructions (Signed)

## 2010-12-23 ENCOUNTER — Other Ambulatory Visit: Payer: Self-pay | Admitting: Family Medicine

## 2011-01-01 ENCOUNTER — Telehealth: Payer: Self-pay | Admitting: *Deleted

## 2011-01-01 NOTE — Telephone Encounter (Signed)
[name] from [facility] called and states pt was d/c from their facility and they failed to give her an order for a wheel chair and that want an order from her PCP. [phone] and direct num is [phone]

## 2011-01-02 MED ORDER — AMBULATORY NON FORMULARY MEDICATION: Status: DC

## 2011-01-02 NOTE — Telephone Encounter (Signed)
Printed rx. We can fax.

## 2011-01-07 ENCOUNTER — Other Ambulatory Visit: Payer: Self-pay | Admitting: *Deleted

## 2011-01-07 MED ORDER — FENTANYL 25 MCG/HR TD PT72: 1.0000 | MEDICATED_PATCH | TRANSDERMAL | Status: DC

## 2011-01-07 MED ORDER — FENTANYL 12 MCG/HR TD PT72: 1.0000 | MEDICATED_PATCH | TRANSDERMAL | Status: DC

## 2011-01-07 NOTE — Telephone Encounter (Signed)
Is the benazepril the only BP med she is taking?

## 2011-01-07 NOTE — Telephone Encounter (Signed)
Spoke with [name] and informed her to monitor pt's BP and if elevated please call us back.  MA, LPN

## 2011-01-07 NOTE — Telephone Encounter (Signed)
Melanie physical therapist with [facility] states at visit this am and pt's BP was 164/100. No s/s of HA, dizziness or blurred vision. MA, LPN

## 2011-01-09 ENCOUNTER — Telehealth: Payer: Self-pay | Admitting: *Deleted

## 2011-01-09 MED ORDER — HYDROCHLOROTHIAZIDE 12.5 MG PO CAPS: 12.5000 mg | ORAL_CAPSULE | Freq: Every day | ORAL | Status: DC

## 2011-01-09 NOTE — Telephone Encounter (Signed)
Nurse calls and states PT is at the home with pt. Blood pressure is elevated 200/110 & 170/190. Please advise.  MA, LPN

## 2011-01-09 NOTE — Telephone Encounter (Signed)
Pt notified and has appt 01-16-11.  MA, LPN

## 2011-01-09 NOTE — Telephone Encounter (Signed)
Will add hctz. Call next week if BP still high.

## 2011-01-12 ENCOUNTER — Other Ambulatory Visit: Payer: Self-pay | Admitting: Family Medicine

## 2011-01-14 ENCOUNTER — Telehealth: Payer: Self-pay | Admitting: *Deleted

## 2011-01-14 MED ORDER — METOPROLOL TARTRATE 100 MG PO TABS: 100.0000 mg | ORAL_TABLET | Freq: Two times a day (BID) | ORAL | Status: DC

## 2011-01-14 NOTE — Telephone Encounter (Signed)
Informed pt of med change and to follow a low sodium diet. Also LM with Melanie/Physical Therapist to please check pt's bp and pulse later in the week and to let us know if pulse less than 55.  MA, LPN

## 2011-01-14 NOTE — Telephone Encounter (Signed)
Physical therapist called and pt BP elevated at 162/105. Upon ambulation BP raised to 225/120 but then within 10 minutes BP went back to 160/105. Please advise

## 2011-01-14 NOTE — Telephone Encounter (Signed)
I increased her metoprolol 200 mg twice a day. Terazosin to recheck her blood pressure and pulse later this week. I want to make sure that her pulse is not dropping below 55.

## 2011-01-16 ENCOUNTER — Encounter: Payer: Self-pay | Admitting: Family Medicine

## 2011-01-16 ENCOUNTER — Ambulatory Visit (INDEPENDENT_AMBULATORY_CARE_PROVIDER_SITE_OTHER): Payer: Medicare Other | Admitting: Family Medicine

## 2011-01-16 VITALS — BP 121/73 | HR 101 | Wt 160.0 lb

## 2011-01-16 DIAGNOSIS — E611 Iron deficiency: Secondary | ICD-10-CM

## 2011-01-16 DIAGNOSIS — F329 Major depressive disorder, single episode, unspecified: Secondary | ICD-10-CM

## 2011-01-16 DIAGNOSIS — I10 Essential (primary) hypertension: Secondary | ICD-10-CM

## 2011-01-16 DIAGNOSIS — E669 Obesity, unspecified: Secondary | ICD-10-CM

## 2011-01-16 DIAGNOSIS — R7301 Impaired fasting glucose: Secondary | ICD-10-CM

## 2011-01-16 DIAGNOSIS — E785 Hyperlipidemia, unspecified: Secondary | ICD-10-CM

## 2011-01-16 NOTE — Patient Instructions (Signed)
Ask Dr. Franki Monte if [facility] to get the shingles vaccine.

## 2011-01-16 NOTE — Progress Notes (Signed)
  Subjective:    Patient ID: [name], female    DOB: 1932/01/15, 78 y.o.   MRN: [id number]  HPI Was recently in SNF for her back.  She is now back home. She is here to go over her medications and see if any adjustments or changes need to be made. She does have some questions and concerns about several of the medications. Hx of birth defect. Says feels like she is going to vomit when she takes her floraster. Will stop it. She also stopped her fish oil a couple of days ago. Has had really high BP at home but was well controlled in the NH.  No CP or SOB.  She has lost about 30 lbs. She is in a wheelchair today.   She wants to dec her cymbalta fronm 90 md to 60mg .  Says her mood is better now that she is home today. Says 60mg  is her usual dose  Hx of insulin resistance. On metformin daily. She does not have a meter at home and wonders why she cannot get one.   Review of Systems     Objective:   Physical Exam  Constitutional: She is oriented to person, place, and time. She appears well-developed and well-nourished.  HENT:  Head: Normocephalic and atraumatic.  Cardiovascular: Normal rate, regular rhythm and normal heart sounds.   Pulmonary/Chest: Effort normal and breath sounds normal.  Neurological: She is alert and oriented to person, place, and time.  Skin: Skin is warm and dry.  Psychiatric: She has a normal mood and affect. Her behavior is normal.          Assessment & Plan:  Iron def - Re heck your iron. If back to normal will stop her supplement.   Insulin resistance - will recheck A1C.  With the weight may be well controlled. Will stop her metformin. Will recheck fasting glucose in 3-6 months.   Lab Results  Component Value Date   HGBA1C 5.7 01/16/2011    Depression - Dec cymbalta to 60mg  daily.  Stop the 30 mg.   Hyperlipidemia - due to recheck lipids. She is unable to tolerate statins but has lost some weight and has been taking fish oil.   GI upset - Stop the  florastor. Hold fish  Oil. Once feeling bettr can restart the fish oil.   I did ask that she speak with her rheumatologist, Dr. Franki Monte, to see if it's okay for her to have the shingles vaccine as she is on immunosuppressant for her lupus.

## 2011-01-23 ENCOUNTER — Telehealth: Payer: Self-pay | Admitting: *Deleted

## 2011-01-23 NOTE — Telephone Encounter (Signed)
Will keep meds the same. Make sure taking her lorazepam bid, instead of as needed.  If they feel we might need ot inc to tid then let me know.

## 2011-01-23 NOTE — Telephone Encounter (Signed)
Pt informed and spoke with nurse. Nurse states that BP is 110/60. Pt informed to take lorazepam BID, not as needed.

## 2011-01-23 NOTE — Telephone Encounter (Signed)
[facility] PT called and states that pt's BP on arrival was 190/98 and pt stated that she was feeling anxious. Took anxiety med and waited approx 45 mins, BO 132/82 HR 72. Pt walked with therapy and BP was 209/103. Now HR is 60 Pulse ox 98%. Thearpist states that RN will be by home shortly. Please advise.

## 2011-01-28 ENCOUNTER — Other Ambulatory Visit: Payer: Self-pay | Admitting: *Deleted

## 2011-01-28 LAB — CBC
Hemoglobin: 11.4 g/dL — ABNORMAL LOW (ref 12.0–15.0)
MCH: 29.3 pg (ref 26.0–34.0)
MCHC: 33 g/dL (ref 30.0–36.0)
RDW: 15.8 % — ABNORMAL HIGH (ref 11.5–15.5)

## 2011-01-28 MED ORDER — BENAZEPRIL HCL 40 MG PO TABS: 40.0000 mg | ORAL_TABLET | Freq: Every day | ORAL | Status: DC

## 2011-01-29 LAB — LIPID PANEL
Cholesterol: 208 mg/dL — ABNORMAL HIGH (ref 0–200)
LDL Cholesterol: 127 mg/dL — ABNORMAL HIGH (ref 0–99)
VLDL: 20 mg/dL (ref 0–40)

## 2011-02-05 ENCOUNTER — Other Ambulatory Visit: Payer: Self-pay | Admitting: *Deleted

## 2011-02-05 MED ORDER — FENTANYL 12 MCG/HR TD PT72: 1.0000 | MEDICATED_PATCH | TRANSDERMAL | Status: DC

## 2011-02-12 ENCOUNTER — Other Ambulatory Visit: Payer: Self-pay | Admitting: Family Medicine

## 2011-02-27 ENCOUNTER — Telehealth: Payer: Self-pay | Admitting: *Deleted

## 2011-02-27 NOTE — Telephone Encounter (Signed)
Patient can stop with Fentanyl patch when finished with this prescription.

## 2011-02-27 NOTE — Telephone Encounter (Signed)
Pt wants to wean off the Fentanyl patches if she can. She is on the 12.5 mcg patch. States that if she shouldn't wean it off yet then she needs a new refill. Please advise.

## 2011-02-27 NOTE — Telephone Encounter (Signed)
Pt informed

## 2011-03-03 ENCOUNTER — Ambulatory Visit (INDEPENDENT_AMBULATORY_CARE_PROVIDER_SITE_OTHER): Payer: Medicare Other | Admitting: Family Medicine

## 2011-03-03 ENCOUNTER — Encounter: Payer: Self-pay | Admitting: Family Medicine

## 2011-03-03 VITALS — BP 150/85 | HR 79 | Wt 160.0 lb

## 2011-03-03 DIAGNOSIS — G8929 Other chronic pain: Secondary | ICD-10-CM

## 2011-03-03 DIAGNOSIS — F329 Major depressive disorder, single episode, unspecified: Secondary | ICD-10-CM

## 2011-03-03 DIAGNOSIS — I10 Essential (primary) hypertension: Secondary | ICD-10-CM

## 2011-03-03 DIAGNOSIS — E119 Type 2 diabetes mellitus without complications: Secondary | ICD-10-CM

## 2011-03-03 LAB — GLUCOSE, POCT (MANUAL RESULT ENTRY): POC Glucose: 127

## 2011-03-03 MED ORDER — MEPERIDINE HCL 50 MG PO TABS: 50.0000 mg | ORAL_TABLET | Freq: Two times a day (BID) | ORAL | Status: DC | PRN

## 2011-03-03 NOTE — Patient Instructions (Signed)
Don't forget to ask Dr. Franki Monte about getting Shingles vaccine.

## 2011-03-03 NOTE — Progress Notes (Signed)
  Subjective:    Patient ID: [name], female    DOB: November 08, 1932, 78 y.o.   MRN: [id number]  HPI Depression - doing well.  [name] denies incontinence or symptoms of depression currently. [name] feels her mood has been wonderful.  HTN - dong well. No CP or SOB. [name] says her blood pressures are mostly running around 130/80 at home. It is up a little bit today. No changes in her diet recently. In fact [name] continues to work on her weight and says [name] has been able to go down to below her pant size which [name] is excited about.  Pain - Off the pain patches. Will take Tylenol during the day.  Needs refill on nightime pain medication [name] has really done quite well weaning off of her pain medications. [name] is enjoying some but only with assistance. [name]'s been wearing her braces on her legs. [name] is still nervous about moving around on her own.  Review of Systems     Objective:   Physical Exam  Constitutional: [name] is oriented to person, place, and time. [name] appears well-developed and well-nourished.  HENT:  Head: Normocephalic and atraumatic.  Cardiovascular: Normal rate, regular rhythm and normal heart sounds.   Pulmonary/Chest: Effort normal and breath sounds normal.  Neurological: [name] is alert and oriented to person, place, and time.  Skin: Skin is warm and dry.  Psychiatric: [name] has a normal mood and affect. Her behavior is normal.          Assessment & Plan:  HTN - Up today but well controlled at home. We'll continue to monitor. Follow up in 3 months.  Depression - PHQ-9 of 1.  Doing well. Mostly on cymbalta for her chronic pain.    Chronic pain-I did change the state of her prescription refilled her Demerol for 30 tabs. Continue the cymbalta.    [name] did a lot of questioning her for fall risk. [name] scored 11 which is moderate.  Currently [name] is working with physical therapy and obligatory that evaluation of her home. [name] is only walking with assistance at this time.

## 2011-03-09 ENCOUNTER — Telehealth: Payer: Self-pay | Admitting: Family Medicine

## 2011-03-09 NOTE — Telephone Encounter (Signed)
Call pt: I saw Dr. Alcide Goodness note and he is OK with you getting shingles vaccine.  Let me know if need written rx or if Dr. Franki Monte already gave her one.  She can get it at [facility] or [place].

## 2011-03-10 ENCOUNTER — Telehealth: Payer: Self-pay | Admitting: Family Medicine

## 2011-03-10 NOTE — Telephone Encounter (Signed)
OK, will you call [name] and see if we can get a prior authorization form? Thank you.

## 2011-03-10 NOTE — Telephone Encounter (Signed)
Pt is allergic to codeine and oxycodone

## 2011-03-10 NOTE — Telephone Encounter (Signed)
Her [employer] will no longer cover the meperidine. They did fill one time but we received a letter that it will have to be changed when she is due for the next refill it will have to be changed to either codeine or oxycodone.

## 2011-03-11 MED ORDER — AMBULATORY NON FORMULARY MEDICATION: Status: DC

## 2011-03-11 NOTE — Telephone Encounter (Signed)
There is no prior auth

## 2011-03-11 NOTE — Telephone Encounter (Signed)
Dr. Franki Monte said it was fine and we can send it to [facility]

## 2011-03-12 ENCOUNTER — Other Ambulatory Visit: Payer: Self-pay | Admitting: Family Medicine

## 2011-03-26 ENCOUNTER — Other Ambulatory Visit: Payer: Self-pay | Admitting: Family Medicine

## 2011-03-27 ENCOUNTER — Telehealth: Payer: Self-pay | Admitting: *Deleted

## 2011-03-27 NOTE — Telephone Encounter (Signed)
Agree 

## 2011-03-27 NOTE — Telephone Encounter (Signed)
Threasa Beards called wanting VO for PT 2 x week x 3 weeks. VO given.  FYI

## 2011-03-30 ENCOUNTER — Other Ambulatory Visit: Payer: Self-pay | Admitting: Family Medicine

## 2011-03-30 ENCOUNTER — Telehealth: Payer: Self-pay | Admitting: *Deleted

## 2011-03-30 MED ORDER — AMLODIPINE BESYLATE 5 MG PO TABS: 5.0000 mg | ORAL_TABLET | Freq: Every day | ORAL | Status: DC

## 2011-03-30 NOTE — Telephone Encounter (Signed)
We can add a low dose betablocker.  Recheck Bp here in the office in 2 weeks. Make sure eating a low salt diet as well. Under 1500mg  daily.

## 2011-03-30 NOTE — Telephone Encounter (Signed)
Pt states that she called the pharmacy for a refill on her cymbalta and they told her that you wanted to talk to her before it was filled. I don't see a note about any problems with her med. Please advise if I can refill.

## 2011-03-30 NOTE — Telephone Encounter (Signed)
Pt informed

## 2011-03-30 NOTE — Telephone Encounter (Signed)
[name] states that pt's BP is 184/78 and then rechecked and was 175/90 with a pulse of 58. States pt is asymptomatic.

## 2011-03-30 NOTE — Telephone Encounter (Signed)
Ok to refill, not sure either.

## 2011-03-31 NOTE — Telephone Encounter (Signed)
Pt informed and is scheduling NV in 2 weeks.

## 2011-04-01 ENCOUNTER — Telehealth: Payer: Self-pay | Admitting: *Deleted

## 2011-04-01 NOTE — Telephone Encounter (Signed)
Spoke with pt, she requested appt with dr Stanford Breed

## 2011-04-02 ENCOUNTER — Telehealth: Payer: Self-pay | Admitting: *Deleted

## 2011-04-02 NOTE — Telephone Encounter (Signed)
States pt's BP was 185/90 today and the BP was good yesterday with the nurse. I called pt and she has started taking the Norvasc 2 days ago. She states that she had also been up doing things around her home.  FYI

## 2011-04-02 NOTE — Telephone Encounter (Signed)
Have then recheck once sitting for 10 minutes.

## 2011-04-02 NOTE — Telephone Encounter (Signed)
Pt states that she will have the nurse to check her BP that way. States that she checked it with her machine at 2:30pm and it was 113/63.

## 2011-04-06 ENCOUNTER — Other Ambulatory Visit: Payer: Self-pay | Admitting: Family Medicine

## 2011-04-08 ENCOUNTER — Ambulatory Visit (INDEPENDENT_AMBULATORY_CARE_PROVIDER_SITE_OTHER): Payer: Medicare Other | Admitting: Cardiology

## 2011-04-08 ENCOUNTER — Encounter: Payer: Self-pay | Admitting: Cardiology

## 2011-04-08 VITALS — BP 132/70 | HR 62 | Ht 63.0 in | Wt 165.0 lb

## 2011-04-08 DIAGNOSIS — E785 Hyperlipidemia, unspecified: Secondary | ICD-10-CM

## 2011-04-08 DIAGNOSIS — I10 Essential (primary) hypertension: Secondary | ICD-10-CM

## 2011-04-08 DIAGNOSIS — I059 Rheumatic mitral valve disease, unspecified: Secondary | ICD-10-CM

## 2011-04-08 DIAGNOSIS — I4891 Unspecified atrial fibrillation: Secondary | ICD-10-CM

## 2011-04-08 NOTE — Assessment & Plan Note (Signed)
The patient remains in sinus rhythm. Previous atrial fibrillation was felt to be secondary to pneumonia. I will discontinue amiodarone. Hopefully she will not develop recurrent arrhythmias. We discussed Coumadin. However she would prefer to avoid as she has a history of ulcers.

## 2011-04-08 NOTE — Assessment & Plan Note (Signed)
Blood pressure controlled. Continue present medications. 

## 2011-04-08 NOTE — Progress Notes (Signed)
HPI: Pleasant female for FU of atrial fibrillation. The patient was admitted in [place] on January 31 of 2012 with pneumonia and atrial fibrillation. Per the discharge summary echocardiogram showed normal LV function; there was moderately severe mitral regurgitation, mild tricuspid regurgitation and mild pulmonary hypertension. Cardiac markers and TSH were normal. Chest CT showed no pulmonary embolus. She was placed on amiodarone and converted to sinus rhythm. It was felt this could be discontinued in approximately 6-8 weeks as her atrial fibrillation may have been related to her pneumonia. Anticoagulation apparently was suggested but the patient declined. Pt had a nuclear study after DC that was negative by report (records not available). I last saw her in May 2012. We felt that MR may be contributing to afib and continued amiodarone. Since then, the patient was hospitalized for a back infection. She was in a nursing home for 6 months. She continues to have dyspnea with exertion which she states is unchanged. No orthopnea, PND or pedal edema. No palpitations, chest pain or syncope.  Current Outpatient Prescriptions  Medication Sig Dispense Refill  . amiodarone (PACERONE) 200 MG tablet Take 200 mg by mouth 2 (two) times daily.       Marland Kitchen amLODipine (NORVASC) 5 MG tablet Take 1 tablet (5 mg total) by mouth daily.  30 tablet  3  . benazepril (LOTENSIN) 40 MG tablet Take 1 tablet (40 mg total) by mouth daily.  30 tablet  2  . Calcium Carbonate-Vitamin D (CALTRATE 600+D) 600-400 MG-UNIT per tablet Take 1 tablet by mouth 2 (two) times daily.       . CYMBALTA 60 MG capsule TAKE (1) CAPSULE DAILY.  30 each  3  . Docusate Calcium (STOOL SOFTENER PO) Take by mouth.        . Ferrous Sulfate (IRON) 325 (65 FE) MG TABS Take 1 tablet by mouth daily.        . hydrochlorothiazide (MICROZIDE) 12.5 MG capsule TAKE 1 CAPSULE DAILY  30 capsule  3  . hydroxychloroquine (PLAQUENIL) 200 MG tablet Take by mouth daily.         Marland Kitchen LORazepam (ATIVAN) 0.5 MG tablet Take 0.5 mg by mouth as needed.       . meperidine (DEMEROL) 50 MG tablet Take 1 tablet (50 mg total) by mouth 2 (two) times daily as needed.  30 tablet  0  . metaxalone (SKELAXIN) 800 MG tablet TAKE 1 TABLET 3 TIMES DAILY.  90 tablet  0  . metoprolol (LOPRESSOR) 100 MG tablet Take 1 tablet (100 mg total) by mouth 2 (two) times daily.  60 tablet  3  . oxybutynin (DITROPAN) 5 MG tablet TAKE 1 TABLET TWICE A DAY  60 tablet  3  . sucralfate (CARAFATE) 1 G tablet TAKE 1 TABLET TWICE A DAY  60 tablet  3  . AMBULATORY NON FORMULARY MEDICATION zostavax vaccine Disp 1 vial  Use as directed 0 refills  1 vial  0     Past Medical History  Diagnosis Date  . Hyperlipidemia   . Atrial fibrillation   . Anxiety   . Lupus erythematosus     discoid  . Peripheral neuropathy   . IBS (irritable bowel syndrome)   . Adenocarcinoma     breast, right  . Hemorrhoids   . Renal insufficiency   . Gastric ulcer   . Headache     chronic  . Lumbar disc disease   . Anemia     iron deficiency- NOS  . Depression   .  Diabetic retinopathy     Dr Jodi Mourning  . DDD (degenerative disc disease), lumbar     Dr Owens Shark  . Morton neuroma     right- Dr Wardell Honour  . PUD (peptic ulcer disease)   . Discoid lupus   . Hypertension     oral meds    Past Surgical History  Procedure Date  . Abdominal hysterectomy     1 oophorectomy  . Total knee arthroplasty     both- Dr Devona Konig  . Lumbar laminectomy   . Right partial mastectomy w/sentinel lymph node biopsy 11-09  . Cholecystectomy 08/2005    History   Social History  . Marital Status: Widowed    Spouse Name: N/A    Number of Children: 3  . Years of Education: N/A   Occupational History  . Retired    Social History Main Topics  . Smoking status: Never Smoker   . Smokeless tobacco: Never Used  . Alcohol Use: 0.6 oz/week    1 Glasses of wine per week     occasional  . Drug Use: No  . Sexually Active: Not on file    Other Topics Concern  . Not on file   Social History Narrative  . No narrative on file    ROS: Problems with low back pain which is improving but no fevers or chills, productive cough, hemoptysis, dysphasia, odynophagia, melena, hematochezia, dysuria, hematuria, rash, seizure activity, orthopnea, PND, pedal edema, claudication. Remaining systems are negative.  Physical Exam: Well-developed well-nourished in no acute distress.  Skin is warm and dry.  HEENT is normal.  Neck is supple. No thyromegaly.  Chest is clear to auscultation with normal expansion.  Cardiovascular exam is regular rate and rhythm.  Abdominal exam nontender or distended. No masses palpated. Extremities show no edema. neuro grossly intact  ECG sinus rhythm at rate 62. Left anterior fascicular block. Incomplete right bundle branch block. No ST changes.

## 2011-04-08 NOTE — Assessment & Plan Note (Signed)
Management per primary care. 

## 2011-04-08 NOTE — Patient Instructions (Signed)
Your physician recommends that you schedule a follow-up appointment in: [place] has requested that you have an echocardiogram. Echocardiography is a painless test that uses sound waves to create images of your heart. It provides your doctor with information about the size and shape of your heart and how well your heart's chambers and valves are working. This procedure takes approximately one hour. There are no restrictions for this procedure.

## 2011-04-08 NOTE — Assessment & Plan Note (Signed)
Repeat echocardiogram for mitral regurgitation.

## 2011-04-13 ENCOUNTER — Telehealth: Payer: Self-pay | Admitting: Cardiology

## 2011-04-13 MED ORDER — AMIODARONE HCL 200 MG PO TABS: 200.0000 mg | ORAL_TABLET | Freq: Two times a day (BID) | ORAL | Status: DC

## 2011-04-13 NOTE — Telephone Encounter (Signed)
Please return call to patient, who has decided to continue taking Amiodarone give to patient in the hospital.  Medication not in med list, patient will need a prescription.  Please contact patient at  [phone]

## 2011-04-13 NOTE — Telephone Encounter (Signed)
Spoke with pt, she recently saw dr Stanford Breed and was given the option to stop the amiodarone. She has decided to cont taking it. Script called to pharm. Pt also reports she is taking a baby aspirin daily.

## 2011-04-14 ENCOUNTER — Ambulatory Visit (INDEPENDENT_AMBULATORY_CARE_PROVIDER_SITE_OTHER): Payer: Medicare Other | Admitting: Family Medicine

## 2011-04-14 VITALS — BP 138/66 | HR 64

## 2011-04-14 DIAGNOSIS — I10 Essential (primary) hypertension: Secondary | ICD-10-CM

## 2011-04-14 NOTE — Progress Notes (Signed)
Patient ID: [name], female   DOB: [birth year], 78 y.o.   MRN: [id number]  Pt denies chest pain, SOB, dizziness, or heart palpitations.  Taking meds as directed w/o problems.  Denies medication side effects.  5 min spent with pt.   HTN- blood pressure is at goal today and she is doing well. She regularly scheduled followup.Recently saw cardiology.

## 2011-04-16 ENCOUNTER — Telehealth: Payer: Self-pay | Admitting: *Deleted

## 2011-04-16 MED ORDER — AMBULATORY NON FORMULARY MEDICATION: Status: DC

## 2011-04-16 NOTE — Telephone Encounter (Signed)
Normally they have to have a full eval for a scooter, but I will go ahead a write a rx.

## 2011-04-16 NOTE — Telephone Encounter (Signed)
[name] states that they are following pt. Pt is requesting a scooter. [name] states that if we can write an rx for a scooter and fax it to [phone] that they can help her from there. Please advise.

## 2011-04-17 NOTE — Telephone Encounter (Signed)
Faxed

## 2011-04-21 ENCOUNTER — Ambulatory Visit (HOSPITAL_COMMUNITY): Payer: Medicare Other | Attending: Cardiovascular Disease

## 2011-04-21 ENCOUNTER — Other Ambulatory Visit: Payer: Self-pay

## 2011-04-21 DIAGNOSIS — E785 Hyperlipidemia, unspecified: Secondary | ICD-10-CM | POA: Insufficient documentation

## 2011-04-21 DIAGNOSIS — I080 Rheumatic disorders of both mitral and aortic valves: Secondary | ICD-10-CM | POA: Insufficient documentation

## 2011-04-21 DIAGNOSIS — C50919 Malignant neoplasm of unspecified site of unspecified female breast: Secondary | ICD-10-CM | POA: Insufficient documentation

## 2011-04-21 DIAGNOSIS — I10 Essential (primary) hypertension: Secondary | ICD-10-CM | POA: Insufficient documentation

## 2011-04-21 DIAGNOSIS — R0989 Other specified symptoms and signs involving the circulatory and respiratory systems: Secondary | ICD-10-CM | POA: Insufficient documentation

## 2011-04-21 DIAGNOSIS — I4891 Unspecified atrial fibrillation: Secondary | ICD-10-CM

## 2011-04-21 DIAGNOSIS — R0609 Other forms of dyspnea: Secondary | ICD-10-CM | POA: Insufficient documentation

## 2011-04-21 DIAGNOSIS — I059 Rheumatic mitral valve disease, unspecified: Secondary | ICD-10-CM

## 2011-04-28 ENCOUNTER — Other Ambulatory Visit: Payer: Self-pay | Admitting: *Deleted

## 2011-04-28 MED ORDER — AMLODIPINE BESYLATE 5 MG PO TABS: 5.0000 mg | ORAL_TABLET | Freq: Two times a day (BID) | ORAL | Status: DC

## 2011-04-29 ENCOUNTER — Ambulatory Visit: Payer: Medicare Other | Admitting: Cardiology

## 2011-05-05 ENCOUNTER — Telehealth: Payer: Self-pay | Admitting: Cardiology

## 2011-05-05 MED ORDER — AMIODARONE HCL 200 MG PO TABS: 200.0000 mg | ORAL_TABLET | Freq: Two times a day (BID) | ORAL | Status: DC

## 2011-05-05 NOTE — Telephone Encounter (Signed)
Please return call to patient regarding medication change/requests, she can be reached at [phone]

## 2011-05-05 NOTE — Telephone Encounter (Signed)
Spoke with pt, at the last office visit she and dr Stanford Breed had discussed stopping the amiodarone. The pt has decided she wants to cont to take amiodarone. New script sent to the pharm.

## 2011-05-13 ENCOUNTER — Other Ambulatory Visit: Payer: Self-pay | Admitting: Family Medicine

## 2011-06-01 ENCOUNTER — Ambulatory Visit (INDEPENDENT_AMBULATORY_CARE_PROVIDER_SITE_OTHER): Payer: Medicare Other | Admitting: Family Medicine

## 2011-06-01 VITALS — BP 129/73 | HR 105 | Wt 168.0 lb

## 2011-06-01 DIAGNOSIS — I10 Essential (primary) hypertension: Secondary | ICD-10-CM

## 2011-06-01 DIAGNOSIS — G629 Polyneuropathy, unspecified: Secondary | ICD-10-CM

## 2011-06-01 DIAGNOSIS — G589 Mononeuropathy, unspecified: Secondary | ICD-10-CM

## 2011-06-01 NOTE — Patient Instructions (Signed)
Try to get your shingles vaccine We will call you with your lab results. If you don't here from [place] in about a week then please give [place] a call at [phone].

## 2011-06-01 NOTE — Progress Notes (Signed)
  Subjective:    Patient ID: [name], female    DOB: 12-Aug-1932, 78 y.o.   MRN: [id number]  HPI HTN -Taking her meds regularly.  No CP or SOB. Eats a low salt diet.   Neuropathy - Says legs are numb but not painful.  Tried neurontin and had hives. It is numb from ankles down in both feet.  No burning. Still uses a rolling chair walker.     Review of Systems     Objective:   Physical Exam  Constitutional: She is oriented to person, place, and time. She appears well-developed and well-nourished.  HENT:  Head: Normocephalic and atraumatic.  Neck: Neck supple. No thyromegaly present.  Cardiovascular: Normal rate, regular rhythm and normal heart sounds.        No carotid bruits  Pulmonary/Chest: Effort normal and breath sounds normal.  Musculoskeletal: She exhibits no edema.  Lymphadenopathy:    She has no cervical adenopathy.  Neurological: She is alert and oriented to person, place, and time.  Skin: Skin is warm and dry.  Psychiatric: She has a normal mood and affect. Her behavior is normal.          Assessment & Plan:  HTN -well controlled. Continue current regimen. Followup in 4 months.  Neuropathy, likely from spinal stenosis. - She is not actually having pain with her neuropathy she is only having numbness. Explained that medications do not actually reverse the neuropathy they only help control the pain symptoms. I do want to rule out other factors that could be worsening her neuropathy including B12 deficiency, low iron, and thyroid problems. She was given a lab slip today. We will call with the results. If she starts to get pain or discomfort and we can consider other treatments. Lyrica would also be in contraindicated since she had hives with gabapentin. Could consider something such as amitriptyline if needed.  I reminded her to get her shingles vaccine when she goes to the pharmacy. They should already  have the prescription there.

## 2011-06-02 LAB — VITAMIN B12: Vitamin B-12: 394 pg/mL (ref 211–911)

## 2011-06-02 LAB — VITAMIN D 25 HYDROXY (VIT D DEFICIENCY, FRACTURES): Vit D, 25-Hydroxy: 31 ng/mL (ref 30–89)

## 2011-06-02 LAB — FERRITIN: Ferritin: 308 ng/mL — ABNORMAL HIGH (ref 10–291)

## 2011-06-12 ENCOUNTER — Other Ambulatory Visit: Payer: Self-pay | Admitting: Family Medicine

## 2011-07-01 ENCOUNTER — Encounter: Payer: Self-pay | Admitting: Cardiology

## 2011-07-01 ENCOUNTER — Ambulatory Visit (INDEPENDENT_AMBULATORY_CARE_PROVIDER_SITE_OTHER): Payer: Medicare Other | Admitting: Cardiology

## 2011-07-01 VITALS — BP 150/74 | HR 58 | Ht 63.0 in | Wt 170.0 lb

## 2011-07-01 DIAGNOSIS — I059 Rheumatic mitral valve disease, unspecified: Secondary | ICD-10-CM

## 2011-07-01 DIAGNOSIS — I4891 Unspecified atrial fibrillation: Secondary | ICD-10-CM

## 2011-07-01 DIAGNOSIS — I10 Essential (primary) hypertension: Secondary | ICD-10-CM

## 2011-07-01 DIAGNOSIS — E785 Hyperlipidemia, unspecified: Secondary | ICD-10-CM

## 2011-07-01 NOTE — Patient Instructions (Addendum)
Your physician wants you to follow-up in: [place] will receive a reminder letter in the mail two months in advance. If you don't receive a letter, please call our office to schedule the follow-up appointment.   STOP AMIODARONE

## 2011-07-01 NOTE — Assessment & Plan Note (Signed)
I think patient's atrial fibrillation was most likely related to her pneumonia. Followup echocardiogram shows mild mitral regurgitation. I therefore think it is less likely that.the heart disease is contributing. We will discontinue her amiodarone and follow. Hopefully she will not have recurrent atrial fibrillation off of this medication. Continue aspirin. Previously declined Coumadin.

## 2011-07-01 NOTE — Assessment & Plan Note (Signed)
Continue present blood pressure medications and follow. Adjust as needed.

## 2011-07-01 NOTE — Assessment & Plan Note (Signed)
Management per primary care. 

## 2011-07-01 NOTE — Assessment & Plan Note (Signed)
followup echocardiogram shows mild mitral regurgitation.

## 2011-07-01 NOTE — Progress Notes (Signed)
HPI: Pleasant female for FU of atrial fibrillation. The patient was admitted in [place] on January 31 of 2012 with pneumonia and atrial fibrillation. Per the discharge summary echocardiogram showed normal LV function; there was moderately severe mitral regurgitation, mild tricuspid regurgitation and mild pulmonary hypertension. Cardiac markers and TSH were normal. Chest CT showed no pulmonary embolus. She was placed on amiodarone and converted to sinus rhythm. It was felt this could be discontinued in approximately 6-8 weeks as her atrial fibrillation may have been related to her pneumonia. Anticoagulation apparently was suggested but the patient declined. Pt had a nuclear study after DC that was negative by report (records not available). I did continue her amiodarone  As we felt her mitral regurgitation may be contributing to her atrial fibrillation. Echocardiogram was repeated in April of 2013 and showed normal LV function, mild aortic insufficiency and mitral regurgitation. I last saw her in March of 2013. Since that time, she denies dyspnea, chest pain, palpitations, syncope or pedal edema.   Current Outpatient Prescriptions  Medication Sig Dispense Refill  . amiodarone (PACERONE) 200 MG tablet Take 1 tablet (200 mg total) by mouth 2 (two) times daily.  60 tablet  12  . amLODipine (NORVASC) 5 MG tablet Take 1 tablet (5 mg total) by mouth 2 (two) times daily.  60 tablet  12  . aspirin EC 81 MG tablet Take 1 tablet (81 mg total) by mouth daily.  150 tablet  2  . benazepril (LOTENSIN) 40 MG tablet TAKE 1 TABLET DAILY.  30 tablet  4  . Calcium Carbonate-Vitamin D (CALTRATE 600+D) 600-400 MG-UNIT per tablet Take 1 tablet by mouth 2 (two) times daily.       . CYMBALTA 60 MG capsule TAKE (1) CAPSULE DAILY.  30 each  3  . Docusate Calcium (STOOL SOFTENER PO) Take by mouth.        . hydrochlorothiazide (MICROZIDE) 12.5 MG capsule TAKE 1 CAPSULE DAILY  30 capsule  3  . hydroxychloroquine (PLAQUENIL)  200 MG tablet Take by mouth daily.        Marland Kitchen LORazepam (ATIVAN) 0.5 MG tablet Take 0.5 mg by mouth as needed.       . meperidine (DEMEROL) 50 MG tablet Take 1 tablet (50 mg total) by mouth 2 (two) times daily as needed.  30 tablet  0  . metoprolol (LOPRESSOR) 100 MG tablet Take 1 tablet (100 mg total) by mouth 2 (two) times daily.  60 tablet  3  . oxybutynin (DITROPAN) 5 MG tablet TAKE 1 TABLET TWICE A DAY  60 tablet  3  . SKELAXIN 800 MG tablet TAKE 1 TABLET 3 TIMES DAILY.  90 each  0  . sucralfate (CARAFATE) 1 G tablet TAKE 1 TABLET TWICE A DAY  60 tablet  3     Past Medical History  Diagnosis Date  . Hyperlipidemia   . Atrial fibrillation   . Anxiety   . Lupus erythematosus     discoid  . Peripheral neuropathy   . IBS (irritable bowel syndrome)   . Adenocarcinoma     breast, right  . Hemorrhoids   . Renal insufficiency   . Gastric ulcer   . Headache     chronic  . Lumbar disc disease   . Anemia     iron deficiency- NOS  . Depression   . Diabetic retinopathy     Dr Jodi Mourning  . DDD (degenerative disc disease), lumbar     Dr Owens Shark  . [place] Lathe  neuroma     right- Dr Wardell Honour  . PUD (peptic ulcer disease)   . Discoid lupus   . Hypertension     oral meds    Past Surgical History  Procedure Date  . Abdominal hysterectomy     1 oophorectomy  . Total knee arthroplasty     both- Dr Devona Konig  . Lumbar laminectomy   . Right partial mastectomy w/sentinel lymph node biopsy 11-09  . Cholecystectomy 08/2005    History   Social History  . Marital Status: Widowed    Spouse Name: N/A    Number of Children: 3  . Years of Education: N/A   Occupational History  . Retired    Social History Main Topics  . Smoking status: Never Smoker   . Smokeless tobacco: Never Used  . Alcohol Use: 0.6 oz/week    1 Glasses of wine per week     occasional  . Drug Use: No  . Sexually Active: Not on file   Other Topics Concern  . Not on file   Social History Narrative  . No narrative on  file    ROS: weakness but no fevers or chills, productive cough, hemoptysis, dysphasia, odynophagia, melena, hematochezia, dysuria, hematuria, rash, seizure activity, orthopnea, PND, pedal edema, claudication. Remaining systems are negative.  Physical Exam: Well-developed well-nourished in no acute distress.  Skin is warm and dry.  HEENT is normal.  Neck is supple.  Chest is clear to auscultation with normal expansion.  Cardiovascular exam is regular rate and rhythm.  Abdominal exam nontender or distended. No masses palpated. Extremities show no edema. neuro grossly intact  ECG sinus rhythm at a rate of 58. Left axis deviation. Left ventricular hypertrophy. Right bundle branch block.

## 2011-07-13 ENCOUNTER — Other Ambulatory Visit: Payer: Self-pay | Admitting: Family Medicine

## 2011-08-06 ENCOUNTER — Other Ambulatory Visit: Payer: Self-pay | Admitting: Family Medicine

## 2011-08-11 ENCOUNTER — Other Ambulatory Visit: Payer: Self-pay | Admitting: Family Medicine

## 2011-08-14 ENCOUNTER — Telehealth: Payer: Self-pay | Admitting: *Deleted

## 2011-08-14 MED ORDER — METAXALONE 800 MG PO TABS: 800.0000 mg | ORAL_TABLET | Freq: Two times a day (BID) | ORAL | Status: DC

## 2011-08-17 ENCOUNTER — Telehealth: Payer: Self-pay | Admitting: *Deleted

## 2011-08-17 MED ORDER — CYCLOBENZAPRINE HCL 5 MG PO TABS: 5.0000 mg | ORAL_TABLET | Freq: Every day | ORAL | Status: DC | PRN

## 2011-08-17 NOTE — Telephone Encounter (Signed)
Pt notified med sent to pharmacy and instructions on ortho shoes. KG LPN

## 2011-08-17 NOTE — Telephone Encounter (Signed)
Pt is in the doughnut hole and can not afford the Skelaxin 102.00. Wanted to know if there was something else you could send in that would be cheaper for her? Also would like a prescription for orthopedic shoes

## 2011-08-17 NOTE — Telephone Encounter (Signed)
For the orthopedic shoes, she needs to get a form from whatever company she wants to use. Then she can drop it off for have the pharmacy faxed over to [place] we will complete. The heart has be completed in addition to a prescription. We can try low-dose Flexeril in place of the Skelaxin. It might be cheaper.

## 2011-08-28 ENCOUNTER — Other Ambulatory Visit: Payer: Self-pay | Admitting: Family Medicine

## 2011-09-03 ENCOUNTER — Other Ambulatory Visit: Payer: Self-pay | Admitting: Family Medicine

## 2011-09-03 DIAGNOSIS — M791 Myalgia, unspecified site: Secondary | ICD-10-CM

## 2011-09-03 NOTE — Telephone Encounter (Signed)
Please advise if can be refilled.

## 2011-09-09 ENCOUNTER — Telehealth: Payer: Self-pay | Admitting: Family Medicine

## 2011-09-09 DIAGNOSIS — N289 Disorder of kidney and ureter, unspecified: Secondary | ICD-10-CM

## 2011-09-09 NOTE — Telephone Encounter (Addendum)
Call pt: Got labs from Dr. Franki Monte.  Hold HCTZ and cut the lisinopril in half.  REcheck BUN/Cr on Friday.  Her kidney function was up and it looks like it is mild dehydration. .  Is she taking the macrobid? Is she having any UTI sxs?  BUN 46, Cr 1.57.

## 2011-09-10 NOTE — Telephone Encounter (Signed)
Pt.notified

## 2011-09-12 LAB — BASIC METABOLIC PANEL WITH GFR
BUN: 36 mg/dL — ABNORMAL HIGH (ref 6–23)
Chloride: 104 mEq/L (ref 96–112)
Creat: 1.51 mg/dL — ABNORMAL HIGH (ref 0.50–1.10)
GFR, Est African American: 38 mL/min — ABNORMAL LOW
GFR, Est Non African American: 33 mL/min — ABNORMAL LOW
Glucose, Bld: 111 mg/dL — ABNORMAL HIGH (ref 70–99)

## 2011-09-17 ENCOUNTER — Other Ambulatory Visit: Payer: Self-pay | Admitting: Family Medicine

## 2011-09-18 ENCOUNTER — Telehealth: Payer: Self-pay | Admitting: Family Medicine

## 2011-09-18 DIAGNOSIS — N289 Disorder of kidney and ureter, unspecified: Secondary | ICD-10-CM

## 2011-09-18 LAB — BASIC METABOLIC PANEL WITH GFR
BUN: 46 mg/dL — ABNORMAL HIGH (ref 6–23)
CO2: 26 mEq/L (ref 19–32)
Chloride: 102 mEq/L (ref 96–112)
Creat: 1.46 mg/dL — ABNORMAL HIGH (ref 0.50–1.10)
Potassium: 4.6 mEq/L (ref 3.5–5.3)

## 2011-09-21 NOTE — Telephone Encounter (Signed)
bmp placed

## 2011-09-25 ENCOUNTER — Ambulatory Visit (INDEPENDENT_AMBULATORY_CARE_PROVIDER_SITE_OTHER): Payer: Medicare Other | Admitting: Family Medicine

## 2011-09-25 ENCOUNTER — Encounter: Payer: Self-pay | Admitting: Family Medicine

## 2011-09-25 VITALS — BP 140/80 | HR 79 | Wt 175.0 lb

## 2011-09-25 DIAGNOSIS — I10 Essential (primary) hypertension: Secondary | ICD-10-CM

## 2011-09-25 DIAGNOSIS — N289 Disorder of kidney and ureter, unspecified: Secondary | ICD-10-CM

## 2011-09-25 DIAGNOSIS — M791 Myalgia, unspecified site: Secondary | ICD-10-CM

## 2011-09-25 DIAGNOSIS — Z23 Encounter for immunization: Secondary | ICD-10-CM

## 2011-09-25 DIAGNOSIS — Z298 Encounter for other specified prophylactic measures: Secondary | ICD-10-CM

## 2011-09-25 MED ORDER — CYCLOBENZAPRINE HCL 5 MG PO TABS: 5.0000 mg | ORAL_TABLET | Freq: Two times a day (BID) | ORAL | Status: DC | PRN

## 2011-09-25 NOTE — Progress Notes (Signed)
  Subjective:    Patient ID: [name], female    DOB: 1932-08-28, 78 y.o.   MRN: [id number]  HPI HTN - No CP ro SOB.  Saw cards last month. Here for f/u BP. We have stopped her HCTZ and have her cut her lisinopril in half because her BUN and creatinine was elevated. We rechecked it about 4 or 5 days ago and it was still elevated so had her hold her lisinopril as well. She's tried to increase her fluid intake as well.   Review of Systems     Objective:   Physical Exam  Constitutional: She is oriented to person, place, and time. She appears well-developed and well-nourished.  HENT:  Head: Normocephalic and atraumatic.  Cardiovascular: Normal rate, regular rhythm and normal heart sounds.   Pulmonary/Chest: Effort normal and breath sounds normal.  Neurological: She is alert and oriented to person, place, and time.  Skin: Skin is warm and dry.  Psychiatric: She has a normal mood and affect. Her behavior is normal.          Assessment & Plan:  HTN- repeat blood pressure was much better. Continue current regimen. It actually looks pretty good considering that we stop 2 of her medications. I asked her to keep an eye on her blood pressures at home. I explained her that sometimes it can take several weeks for blood pressure to slowly creep up over the next month. She will continue to monitor her blood pressures at home. Followup with me in 2 months.  Renal insufficiency-recheck BMP today. She is now off the HCTZ and lisinopril.

## 2011-09-26 LAB — BASIC METABOLIC PANEL WITH GFR
CO2: 22 mEq/L (ref 19–32)
Calcium: 9.6 mg/dL (ref 8.4–10.5)
Chloride: 106 mEq/L (ref 96–112)
Glucose, Bld: 125 mg/dL — ABNORMAL HIGH (ref 70–99)
Sodium: 140 mEq/L (ref 135–145)

## 2011-10-02 ENCOUNTER — Ambulatory Visit: Payer: Medicare Other | Admitting: Family Medicine

## 2011-10-12 ENCOUNTER — Other Ambulatory Visit: Payer: Self-pay | Admitting: Family Medicine

## 2011-11-26 ENCOUNTER — Ambulatory Visit (INDEPENDENT_AMBULATORY_CARE_PROVIDER_SITE_OTHER): Payer: Medicare Other | Admitting: Family Medicine

## 2011-11-26 ENCOUNTER — Encounter: Payer: Self-pay | Admitting: Family Medicine

## 2011-11-26 VITALS — BP 142/84 | HR 61 | Wt 182.0 lb

## 2011-11-26 DIAGNOSIS — M791 Myalgia, unspecified site: Secondary | ICD-10-CM

## 2011-11-26 DIAGNOSIS — I10 Essential (primary) hypertension: Secondary | ICD-10-CM

## 2011-11-26 DIAGNOSIS — F418 Other specified anxiety disorders: Secondary | ICD-10-CM

## 2011-11-26 DIAGNOSIS — IMO0001 Reserved for inherently not codable concepts without codable children: Secondary | ICD-10-CM

## 2011-11-26 DIAGNOSIS — F341 Dysthymic disorder: Secondary | ICD-10-CM

## 2011-11-26 MED ORDER — AMLODIPINE BESYLATE 10 MG PO TABS: 10.0000 mg | ORAL_TABLET | Freq: Every day | ORAL | Status: DC

## 2011-11-26 NOTE — Progress Notes (Signed)
  Subjective:    Patient ID: [name], female    DOB: [birth year], 78 y.o.   MRN: [id number]  HPI HTN - No CP or SOB.  Doing well. No SE on medications. Taking medications daily. She's very compliant. Her blood pressures have been running mostly around 130 -140.  Needs about 10 days of cymbalta before the end of the year. She has hit her gap and will be expensive to pay out-of-pocket for this.  Lupus-she's on Plaquenil and does get her regular eye exams. Review of Systems     Objective:   Physical Exam  Constitutional: She is oriented to person, place, and time. She appears well-developed and well-nourished.  HENT:  Head: Normocephalic and atraumatic.  Cardiovascular: Normal rate, regular rhythm and normal heart sounds.   Pulmonary/Chest: Effort normal and breath sounds normal.  Neurological: She is alert and oriented to person, place, and time.  Skin: Skin is warm and dry.  Psychiatric: She has a normal mood and affect. Her behavior is normal.          Assessment & Plan:  HTN - No CP or SOB.  Uncontrolled. Almost at goal on current regimen based on her age. Increase amlodipine to 10 mg daily. She's currently taking 5 mg daily. Followup in 6-8 weeks. I did ask her to monitor for any ankle swelling on the increased dose. She 70 pounds please call the office. Continue to monitor home blood pressures.  Depression/anxiety-I gave her samples of Cymbalta hopefully this will cover her until the new year.  Glucose-under fair control. She's on Plaquenil and does get her regular eye exams.

## 2011-12-09 ENCOUNTER — Other Ambulatory Visit: Payer: Self-pay | Admitting: Family Medicine

## 2011-12-25 ENCOUNTER — Other Ambulatory Visit: Payer: Self-pay | Admitting: Family Medicine

## 2012-01-18 ENCOUNTER — Other Ambulatory Visit: Payer: Self-pay | Admitting: Family Medicine

## 2012-01-26 ENCOUNTER — Ambulatory Visit (INDEPENDENT_AMBULATORY_CARE_PROVIDER_SITE_OTHER): Payer: Medicare Other | Admitting: Family Medicine

## 2012-01-26 ENCOUNTER — Encounter: Payer: Self-pay | Admitting: Family Medicine

## 2012-01-26 VITALS — BP 142/79 | HR 90 | Resp 18 | Wt 188.0 lb

## 2012-01-26 DIAGNOSIS — G8929 Other chronic pain: Secondary | ICD-10-CM

## 2012-01-26 DIAGNOSIS — I10 Essential (primary) hypertension: Secondary | ICD-10-CM

## 2012-01-26 DIAGNOSIS — R7301 Impaired fasting glucose: Secondary | ICD-10-CM

## 2012-01-26 DIAGNOSIS — M62838 Other muscle spasm: Secondary | ICD-10-CM

## 2012-01-26 DIAGNOSIS — F329 Major depressive disorder, single episode, unspecified: Secondary | ICD-10-CM

## 2012-01-26 LAB — COMPLETE METABOLIC PANEL WITH GFR
Albumin: 4.4 g/dL (ref 3.5–5.2)
Alkaline Phosphatase: 76 U/L (ref 39–117)
BUN: 32 mg/dL — ABNORMAL HIGH (ref 6–23)
Calcium: 10.1 mg/dL (ref 8.4–10.5)
Chloride: 101 mEq/L (ref 96–112)
GFR, Est Non African American: 46 mL/min — ABNORMAL LOW
Glucose, Bld: 114 mg/dL — ABNORMAL HIGH (ref 70–99)
Potassium: 4.5 mEq/L (ref 3.5–5.3)

## 2012-01-26 LAB — LIPID PANEL: Cholesterol: 214 mg/dL — ABNORMAL HIGH (ref 0–200)

## 2012-01-26 MED ORDER — METOPROLOL TARTRATE 100 MG PO TABS: 100.0000 mg | ORAL_TABLET | Freq: Two times a day (BID) | ORAL | Status: DC

## 2012-01-26 MED ORDER — HYDROCODONE-ACETAMINOPHEN 5-325 MG PO TABS: 1.0000 | ORAL_TABLET | Freq: Four times a day (QID) | ORAL | Status: DC | PRN

## 2012-01-26 NOTE — Progress Notes (Signed)
  Subjective:    Patient ID: [name], female    DOB: 26-Dec-1932, 79 y.o.   MRN: [id number]  HPI Depression-Well controlled on Cymbalta.  She is doing well except for her pain. Says when doesn't feel well.  Sleep is fair. It typically discomfort and pain is what wakes her up.  Says her pain is worse.  Says her insurance no longer covers her flexeril ordered Demerol. She said that when she was hospitalized for so long he had tried multiple pain medications in the one that seemed to work the best was the Demerol. She doesn't tolerate oxycodone or codeine. She doesn't remember specifically what side effects that she has. She's also does not remember if she has tried hydrocodone before or not.  HTN -  Pt denies chest pain, SOB, dizziness, or heart palpitations.  Taking meds as directed w/o problems.  Denies medication side effects.  She is doing well on increased dose of amlodipine. No swelling. Home BPs running in the 130s. She feels they have been more consistant.     Review of Systems     Objective:   Physical Exam  Constitutional: She is oriented to person, place, and time. She appears well-developed and well-nourished.  HENT:  Head: Normocephalic and atraumatic.  Cardiovascular: Normal rate, regular rhythm and normal heart sounds.   Pulmonary/Chest: Effort normal and breath sounds normal.  Neurological: She is alert and oriented to person, place, and time.  Skin: Skin is warm and dry.  Psychiatric: She has a normal mood and affect. Her behavior is normal.          Assessment & Plan:  Depression-PHQ 9 score of 3 today. Well-controlled. Continue current regimen.  Impaired fasting glucose-hemoglobin A1c is 5.7 today which is fantastic. Continue current regimen.  HTN - well controlled. Her home blood pressures seem to be well-controlled even though she slightly elevated today here in the office. Followup in 4 months. Continue current regimen. Monitor for any increased swelling  especially of the lower extremities. Her weight is up a little bit today so we will need to monitor this.  Muscle spasm - will have to change her flexeril to something covered under her insurance.    Chronic pain-unfortunately they were no longer cover her Demerol. She cannot take oxycodone or codeine. Thus we will try hydrocodone product. There he does have her to the local pharmacy for her to try. Cautiously side effects or problems.

## 2012-01-27 ENCOUNTER — Telehealth: Payer: Self-pay

## 2012-01-27 ENCOUNTER — Encounter: Payer: Self-pay | Admitting: *Deleted

## 2012-01-27 NOTE — Telephone Encounter (Signed)
[name] is wanting a pain medication sent to [facility]. I will call today her insurance company to find out what muscle relaxer they will cover.

## 2012-01-27 NOTE — Telephone Encounter (Signed)
We sent rx for hydrocodone yesterday. Just let me know when find out about muscle relaxer.

## 2012-01-28 MED ORDER — TIZANIDINE HCL 2 MG PO TABS: 2.0000 mg | ORAL_TABLET | Freq: Every day | ORAL | Status: DC | PRN

## 2012-01-28 NOTE — Telephone Encounter (Signed)
Pt calls and states that the muscle relaxer her insurance will cover is Tizanidine, Meloxicam and Diclofenac Sodium

## 2012-01-28 NOTE — Telephone Encounter (Signed)
Will send over tizanidine.

## 2012-01-29 NOTE — Telephone Encounter (Signed)
Pt.notified

## 2012-02-11 ENCOUNTER — Other Ambulatory Visit: Payer: Self-pay | Admitting: Family Medicine

## 2012-02-12 ENCOUNTER — Other Ambulatory Visit: Payer: Self-pay | Admitting: *Deleted

## 2012-02-12 IMAGING — CR DG CHEST 2V
2 series · 2 of 2 positions shown · non-contrast
Comparison: None.

CLINICAL DATA: Amiodarone surveillance, atrial fibrillation.
Follow up pneumonia, nonsmoker, right breast cancer.

CHEST - 2 VIEW

[view not recorded (1 of 2)]
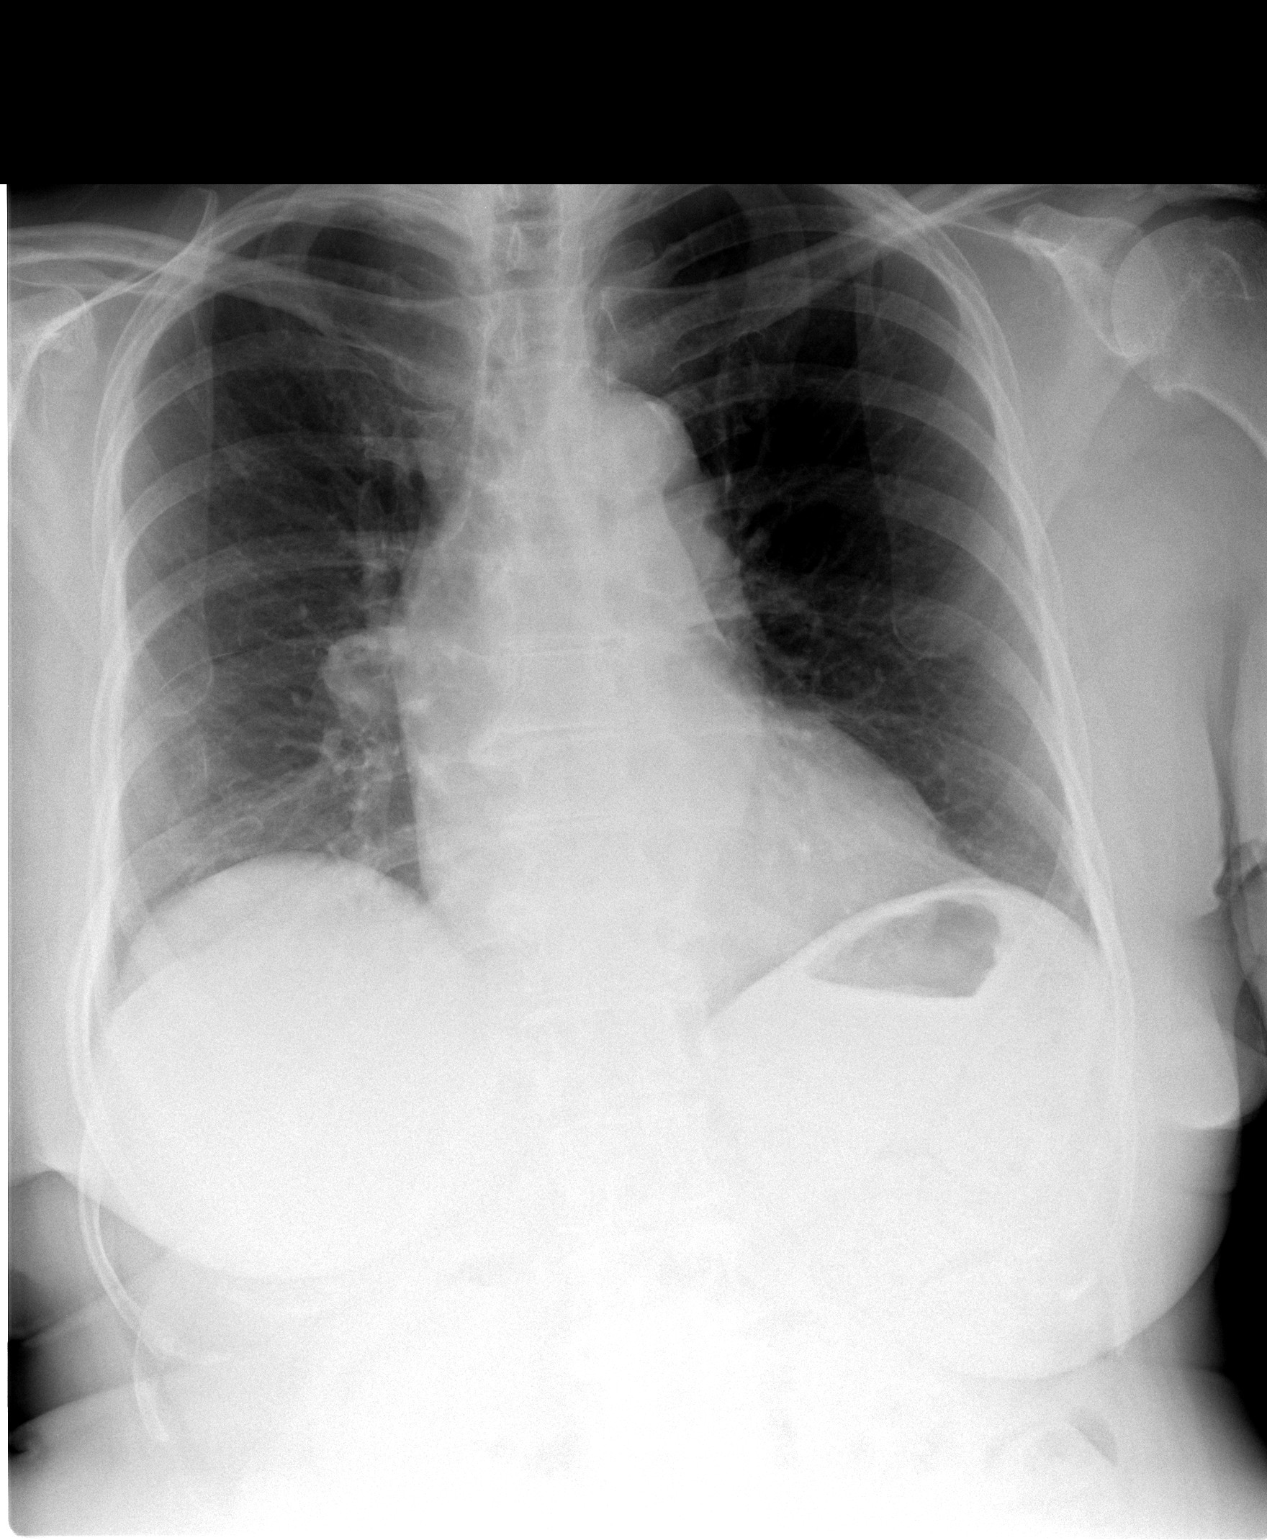

[view not recorded (2 of 2)]
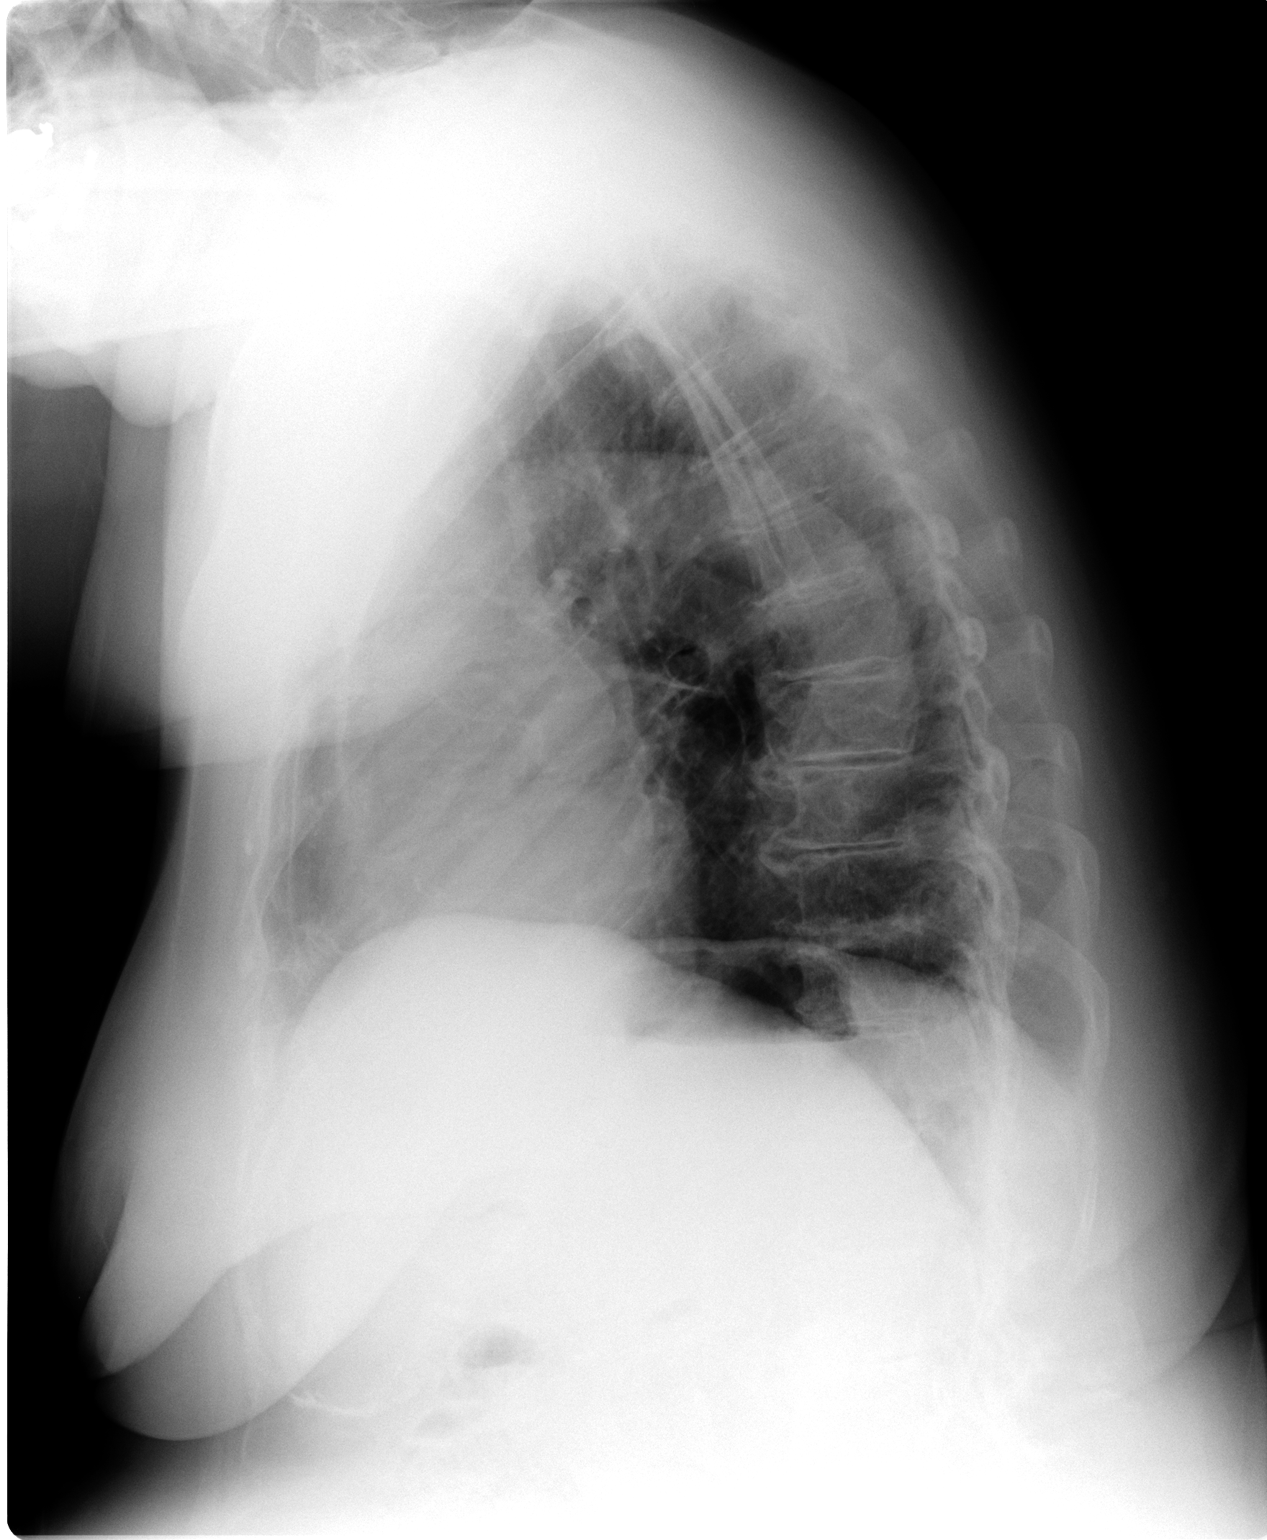

[2 of 2 positions shown; findings below may reference images not displayed]

FINDINGS: Lungs are clear.

Heart size upper normal.

Slightly tortuous normal caliber descending thoracic aorta with
slightly calcified arch.

Mediastinum, hila, pleura, degenerative changes dorsal spine
(slight levoscoliosis lumbar spine) otherwise unremarkable.
IMPRESSION: No active cardiopulmonary disease.

## 2012-02-12 MED ORDER — SUCRALFATE 1 G PO TABS: 1.0000 g | ORAL_TABLET | Freq: Two times a day (BID) | ORAL | Status: DC

## 2012-04-18 ENCOUNTER — Other Ambulatory Visit: Payer: Self-pay | Admitting: Family Medicine

## 2012-05-16 ENCOUNTER — Telehealth: Payer: Self-pay | Admitting: *Deleted

## 2012-05-16 NOTE — Telephone Encounter (Signed)
Neither one of these are statins.  I am not sure where she read this but neither one of these has a statin in it or is a statin.  Now there are combo meds but she is not on them.  BPs sound well controlled.  Numbness in legs is not usually from a statin but oculd be coming from her back up hurts to walk. If she want to hold her amlodipine and see how BPs do on one med can.  Recommend to make an appt.

## 2012-05-16 NOTE — Telephone Encounter (Signed)
Pt calls and states she is on Amlodipine and Metoprolol and states these meds have a statin in them (she looked it up online) and she says she is allergic to statins. She has numbness in both feet and is going up her legs- hurts to walk is using a wheelchair to get around. She wants to know if there is something else she can take for her BP that does not have statin in it. Her BP last night was 111/67 and this morning before taking her meds it was 131/71

## 2012-05-17 NOTE — Telephone Encounter (Signed)
Pt notified and has appt on the 15th to discuss numbness and will continue the BP meds as directed. Clemetine Marker, LPN

## 2012-05-19 ENCOUNTER — Other Ambulatory Visit: Payer: Self-pay | Admitting: Family Medicine

## 2012-05-26 ENCOUNTER — Encounter: Payer: Self-pay | Admitting: Family Medicine

## 2012-05-26 ENCOUNTER — Ambulatory Visit (INDEPENDENT_AMBULATORY_CARE_PROVIDER_SITE_OTHER): Payer: Medicare Other | Admitting: Family Medicine

## 2012-05-26 VITALS — BP 132/80 | HR 66 | Ht 63.0 in | Wt 197.0 lb

## 2012-05-26 DIAGNOSIS — I129 Hypertensive chronic kidney disease with stage 1 through stage 4 chronic kidney disease, or unspecified chronic kidney disease: Secondary | ICD-10-CM

## 2012-05-26 DIAGNOSIS — I10 Essential (primary) hypertension: Secondary | ICD-10-CM

## 2012-05-26 DIAGNOSIS — G609 Hereditary and idiopathic neuropathy, unspecified: Secondary | ICD-10-CM

## 2012-05-26 DIAGNOSIS — G629 Polyneuropathy, unspecified: Secondary | ICD-10-CM | POA: Insufficient documentation

## 2012-05-26 NOTE — Progress Notes (Signed)
Subjective:    Patient ID: [name], female    DOB: 12/19/1932, 79 y.o.   MRN: [id number]  HPI HTN -  Pt denies chest pain, SOB, dizziness, or heart palpitations.  Taking meds as directed w/o problems.  Denies medication side effects.  She denies any swelling or dizziness.  Hyperlipidemiani - Doesnb't tolerate statings. Because of the numbness in her feet she's not able to exercise regularly. She also has to use a rolling walker for stability.  Peripheral Neuropathy - a couple of years. In both legs to mid lower leg.  More on the right.  Chronic back problems.    Review of Systems BP 132/80  Pulse 66  Ht 5\' 3"  (1.6 m)  Wt 197 lb (89.359 kg)  BMI 34.91 kg/m2    Allergies  Allergen Reactions  . Aspirin Other (See Comments)    ulcer  . Gabapentin Hives and Itching  . Oxycodone Other (See Comments)    Pt unsure  . Simvastatin Other (See Comments)    Lost the use of her legs  . Sulfonamide Derivatives Hives and Itching  . Beta Adrenergic Blockers   . Codeine   . Hydrochlorothiazide Other (See Comments)    INcreaseed BUN/Creatinine  . Lisinopril Other (See Comments)    Increased BUN/CR    Past Medical History  Diagnosis Date  . Hyperlipidemia   . Atrial fibrillation   . Anxiety   . Lupus erythematosus     discoid  . Peripheral neuropathy   . IBS (irritable bowel syndrome)   . Adenocarcinoma     breast, right  . Hemorrhoids   . Renal insufficiency   . Gastric ulcer   . Headache     chronic  . Lumbar disc disease   . Anemia     iron deficiency- NOS  . Depression   . Diabetic retinopathy(362.0)     Dr Jodi Mourning  . DDD (degenerative disc disease), lumbar     Dr Owens Shark  . Morton neuroma     right- Dr Wardell Honour  . PUD (peptic ulcer disease)   . Discoid lupus   . Hypertension     oral meds    Past Surgical History  Procedure Laterality Date  . Abdominal hysterectomy      1 oophorectomy  . Total knee arthroplasty      both- Dr Devona Konig  . Lumbar  laminectomy    . Right partial mastectomy w/sentinel lymph node biopsy  11-09  . Cholecystectomy  08/2005    History   Social History  . Marital Status: Widowed    Spouse Name: N/A    Number of Children: 3  . Years of Education: N/A   Occupational History  . Retired    Social History Main Topics  . Smoking status: Never Smoker   . Smokeless tobacco: Never Used  . Alcohol Use: 0.6 oz/week    1 Glasses of wine per week     Comment: occasional  . Drug Use: No  . Sexually Active: Not on file   Other Topics Concern  . Not on file   Social History Narrative  . No narrative on file    Family History  Problem Relation Age of Onset  . Diabetes Mother   . Angina Mother   . COPD Father     colon  . Diabetes Father   . Stroke Father   . Hyperlipidemia Father   . Colon cancer Father     Outpatient Encounter Prescriptions as of  05/26/2012  Medication Sig Dispense Refill  . amLODipine (NORVASC) 10 MG tablet Take 1 tablet (10 mg total) by mouth daily.  30 tablet  6  . aspirin EC 81 MG tablet Take 1 tablet (81 mg total) by mouth daily.  150 tablet  2  . Calcium Carbonate-Vitamin D (CALTRATE 600+D) 600-400 MG-UNIT per tablet Take 1 tablet by mouth 2 (two) times daily.       . CYMBALTA 60 MG capsule TAKE (1) CAPSULE DAILY.  30 capsule  5  . HYDROcodone-acetaminophen (NORCO/VICODIN) 5-325 MG per tablet TAKE 1 TABLET EVERY 6 HOURS.  30 tablet  0  . hydroxychloroquine (PLAQUENIL) 200 MG tablet Take by mouth daily.        . meperidine (DEMEROL) 50 MG tablet Take 50 mg by mouth 2 (two) times daily as needed.      . metoprolol (LOPRESSOR) 100 MG tablet Take 1 tablet (100 mg total) by mouth 2 (two) times daily.  60 tablet  5  . oxybutynin (DITROPAN) 5 MG tablet TAKE 1 TABLET TWICE A DAY  60 tablet  2  . sucralfate (CARAFATE) 1 G tablet TAKE 1 TABLET TWICE A DAY  60 tablet  2  . tiZANidine (ZANAFLEX) 2 MG tablet Take 1 tablet (2 mg total) by mouth daily as needed.  30 tablet  0  .  [DISCONTINUED] Docusate Calcium (STOOL SOFTENER PO) Take by mouth.         No facility-administered encounter medications on file as of 05/26/2012.          Objective:   Physical Exam  Constitutional: She is oriented to person, place, and time. She appears well-developed and well-nourished.  HENT:  Head: Normocephalic and atraumatic.  Neck: Neck supple. No thyromegaly present.  Cardiovascular: Normal rate, regular rhythm and normal heart sounds.   Pulmonary/Chest: Effort normal and breath sounds normal.  Musculoskeletal:  Bilateral trace edema.  Lymphadenopathy:    She has no cervical adenopathy.  Neurological: She is alert and oriented to person, place, and time.  Skin: Skin is warm and dry.  Psychiatric: She has a normal mood and affect. Her behavior is normal.          Assessment & Plan:  HTN - well controlled. Tolerating current regimen well. Followup in 6 months. She is due to recheck her kidney infection.  Chronic kidney disease-stage III. We will recheck her kidney function today make sure that stable.  Hyperlipidemia -statins not well tolerated.  Encourage her to work on increasing her activity level as able to.  Peripheral neuropathy-most likely from her chronic back problems and pain. But just to be on the safe side we'll go ahead and check a B12, folate and iron level. She has had a prior history of iron deficiency anemia. She is not diabetic but does have impaired fasting glucose but her last hemoglobin A1c in January was great at 5.7.

## 2012-05-27 LAB — BASIC METABOLIC PANEL WITH GFR
BUN: 24 mg/dL — ABNORMAL HIGH (ref 6–23)
Creat: 1.01 mg/dL (ref 0.50–1.10)
GFR, Est African American: 62 mL/min
GFR, Est Non African American: 54 mL/min — ABNORMAL LOW

## 2012-07-08 ENCOUNTER — Other Ambulatory Visit: Payer: Self-pay | Admitting: Family Medicine

## 2012-07-21 ENCOUNTER — Other Ambulatory Visit: Payer: Self-pay | Admitting: Family Medicine

## 2012-07-25 ENCOUNTER — Other Ambulatory Visit: Payer: Self-pay | Admitting: Family Medicine

## 2012-07-25 NOTE — Telephone Encounter (Signed)
Call patient see why she fell in the early. It looks like it was just filled about 2-3 weeks ago. She still having significant pain in her low back and please let me know. She still having the neuropathy in her legs and we can set her up for I nerve conduction study to see if this is coming from her back.

## 2012-08-02 ENCOUNTER — Other Ambulatory Visit: Payer: Self-pay | Admitting: Family Medicine

## 2012-08-03 ENCOUNTER — Ambulatory Visit (INDEPENDENT_AMBULATORY_CARE_PROVIDER_SITE_OTHER): Payer: Medicare Other | Admitting: Family Medicine

## 2012-08-03 ENCOUNTER — Encounter: Payer: Self-pay | Admitting: Family Medicine

## 2012-08-03 VITALS — BP 140/80 | Wt 203.0 lb

## 2012-08-03 DIAGNOSIS — R609 Edema, unspecified: Secondary | ICD-10-CM

## 2012-08-03 NOTE — Progress Notes (Signed)
CC: [name] is a 79 y.o. female is here for Edema   Subjective: HPI:  Patient complains of bilateral ankle swelling that has been present for 2 weeks has not been getting better or worse since onset overall. She feels it gets worse at the end of the day improves with elevation or after night of sleep. Is symmetric it is nonpainful. It is mild to moderate in her opinion. She's believes she's taking in less than 2000 mg of salt on a daily basis.. she denies shortness of breath, orthopnea, PND, abdominal swelling, skin changes, weeping lesions on the legs nor swelling anywhere in her body above the shins she's never had this before. No recent change in medication, she does take amlodipine however no change in dosage for over the past 6 months.  She denies fevers, chills, cough, shortness of breath, wheezing, abdominal pain, unilateral swelling, skin changes or dietary changes   Review Of Systems Outlined In HPI  Past Medical History  Diagnosis Date  . Hyperlipidemia   . Atrial fibrillation   . Anxiety   . Lupus erythematosus     discoid  . Peripheral neuropathy   . IBS (irritable bowel syndrome)   . Adenocarcinoma     breast, right  . Hemorrhoids   . Renal insufficiency   . Gastric ulcer   . Headache(784.0)     chronic  . Lumbar disc disease   . Anemia     iron deficiency- NOS  . Depression   . Diabetic retinopathy     Dr Jodi Mourning  . DDD (degenerative disc disease), lumbar     Dr Owens Shark  . Morton neuroma     right- Dr Wardell Honour  . PUD (peptic ulcer disease)   . Discoid lupus   . Hypertension     oral meds     Family History  Problem Relation Age of Onset  . Diabetes Mother   . Angina Mother   . COPD Father     colon  . Diabetes Father   . Stroke Father   . Hyperlipidemia Father   . Colon cancer Father      History  Substance Use Topics  . Smoking status: Never Smoker   . Smokeless tobacco: Never Used  . Alcohol Use: 0.6 oz/week    1 Glasses of wine per  week     Comment: occasional     Objective: Filed Vitals:   08/03/12 1535  BP: 140/80    General: Alert and Oriented, No Acute Distress HEENT: Pupils equal, round, reactive to light. Conjunctivae clear.  Moist mucous membranes pharynx unremarkable Lungs: Clear to auscultation bilaterally, no wheezing/ronchi/rales.  Comfortable work of breathing. Good air movement. Cardiac: Regular rate and rhythm. Normal S1/S2.  No murmurs, rubs, nor gallops.   Abdomen: Obese soft nontender Extremities: 1+ nonpitting edema at the ankles distally symmetric affecting both extremities no peripheral edema elsewhere, she has strong peripheral pulses Mental Status: No depression, anxiety, nor agitation. Skin: Warm and dry.  Assessment & Plan: [name] was seen today for edema.  Diagnoses and associated orders for this visit:  Edema - COMPLETE METABOLIC PANEL WITH GFR    Discussed with patient most likely etiology is due to venous insufficiency however would like to rule out worsening renal failure with metabolic panel. Low clinical suspicion for heart failure, I did review her most recent echocardiogram report, discussed salt restriction below 2000 mg a day. Will evaluate for candidacy of as needed Lasix and will discuss with PCP.  Return in about 2 weeks (around 08/17/2012).

## 2012-08-04 ENCOUNTER — Telehealth: Payer: Self-pay | Admitting: Family Medicine

## 2012-08-04 DIAGNOSIS — R609 Edema, unspecified: Secondary | ICD-10-CM

## 2012-08-04 LAB — COMPLETE METABOLIC PANEL WITH GFR
ALT: 12 U/L (ref 0–35)
BUN: 30 mg/dL — ABNORMAL HIGH (ref 6–23)
CO2: 26 mEq/L (ref 19–32)
Calcium: 9.5 mg/dL (ref 8.4–10.5)
Chloride: 103 mEq/L (ref 96–112)
Creat: 1.1 mg/dL (ref 0.50–1.10)
GFR, Est African American: 56 mL/min — ABNORMAL LOW
GFR, Est Non African American: 49 mL/min — ABNORMAL LOW
Glucose, Bld: 95 mg/dL (ref 70–99)
Total Bilirubin: 0.5 mg/dL (ref 0.3–1.2)

## 2012-08-04 MED ORDER — FUROSEMIDE 20 MG PO TABS: ORAL_TABLET | ORAL | Status: DC

## 2012-08-04 NOTE — Telephone Encounter (Signed)
Seth Bake, Will you please let Mrs. Jeffrey know that her kidney function appears unchanged from three months ago. I still believe her ankle swelling is due to the leaky vein condition we discussed yesterday.  She may use a lasix rx that [facility].  It would be wise to f/u with Dr. Jerilynn Mages in 1-2 weeks after starting this to recheck kidney function.

## 2012-08-04 NOTE — Telephone Encounter (Signed)
Pt notified and was transferred up front to schedule with [facility]

## 2012-08-14 IMAGING — RF DG MYELOGRAM LUMBAR
12 of 18 series · 12 of 18 positions shown · IV contrast (omnipaque)
Comparison: MRI lumbar spine 08/14/2010. Plain films 06/02/2010.

MYELOGRAM INJECTION
TECHNIQUE: Informed consent was obtained from the patient prior to
the procedure, including potential complications of headache,
allergy, infection and pain. Specific instructions were given
regarding 24 hour bedrest post procedure to prevent post-LP
headache.  A timeout procedure was performed.  With the patient
prone, the lower back was prepped with Betadine.  1% Lidocaine was
used for local anesthesia.  Lumbar puncture was performed by the
radiologist at the L3 level using a 22 gauge needle with return of
clear CSF.  15 cc of Omnipaque 180 was injected into the
subarachnoid space .
CLINICAL DATA: Low back and left leg pain.  Previous T12-L1
diskitis.  History of renal failure.  History of hypertension.
TECHNIQUE: Multidetector CT imaging of the lumbar spine was
performed following myelography.  Multiplanar CT image
reconstructions were also generated.

[Series 2: (hospital) · 1 of 1 slices shown]
[im 1/1]
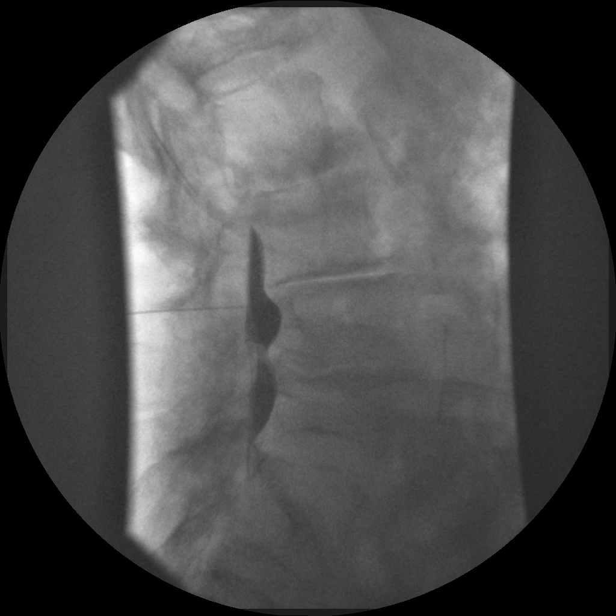

[Series 4: myelogram  white · 1 of 1 slices shown (1 of 11)]
[im 1/1]
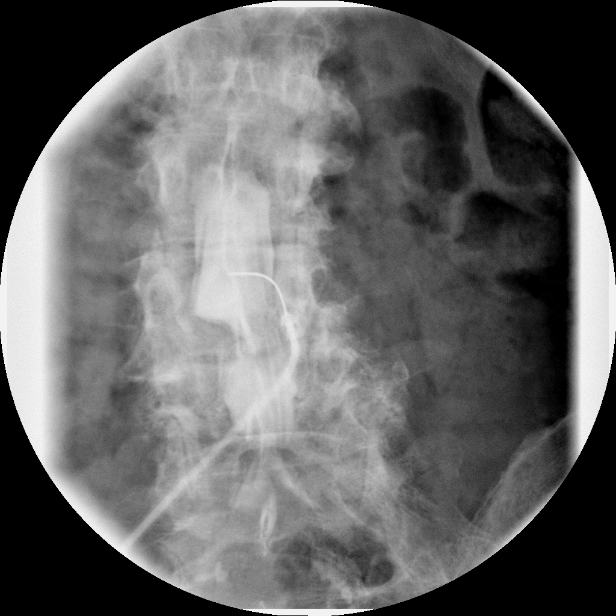

[Series 5: myelogram  white · 1 of 1 slices shown (2 of 11)]
[im 1/1]
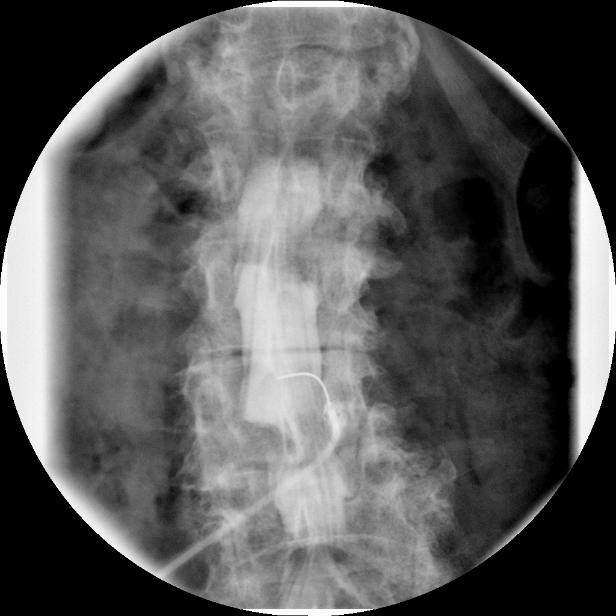

[Series 7: myelogram  white · 1 of 1 slices shown (3 of 11)]
[im 1/1]
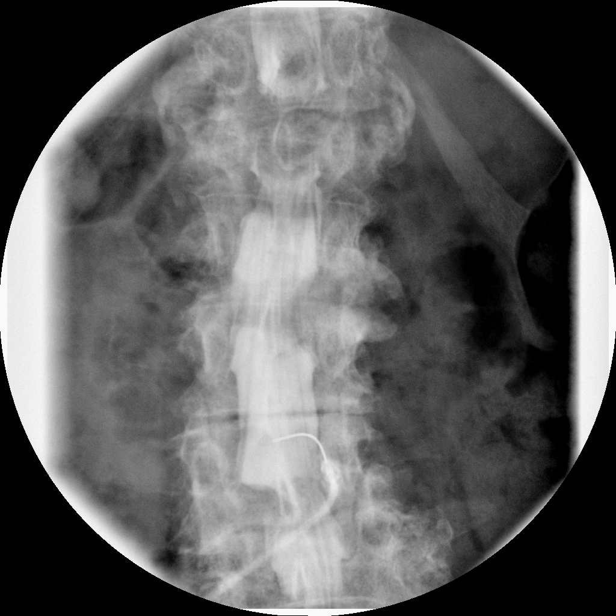

[Series 8: myelogram  white · 1 of 1 slices shown (4 of 11)]
[im 1/1]
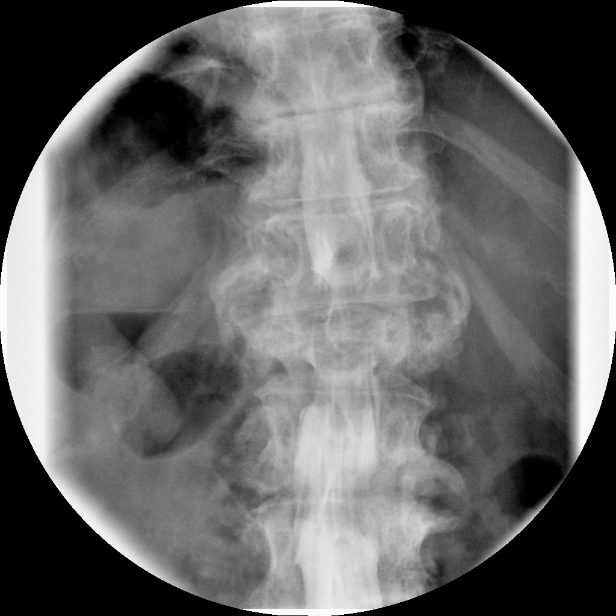

[Series 11: myelogram  white · 1 of 1 slices shown (5 of 11)]
[im 1/1]
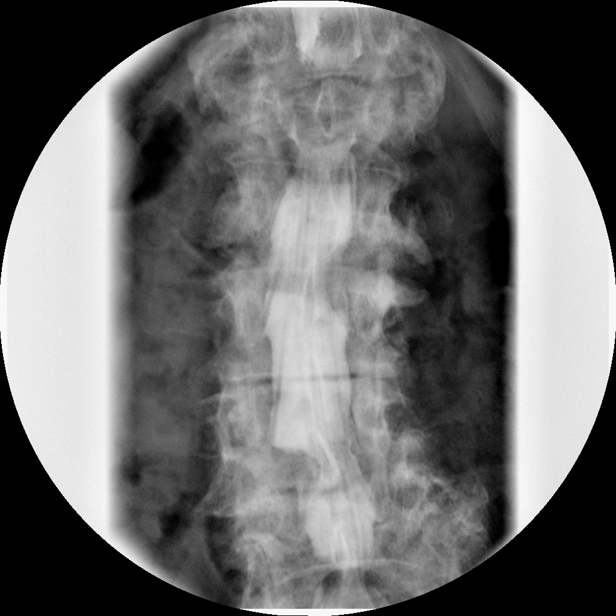

[Series 12: myelogram  white · 1 of 1 slices shown (6 of 11)]
[im 1/1]
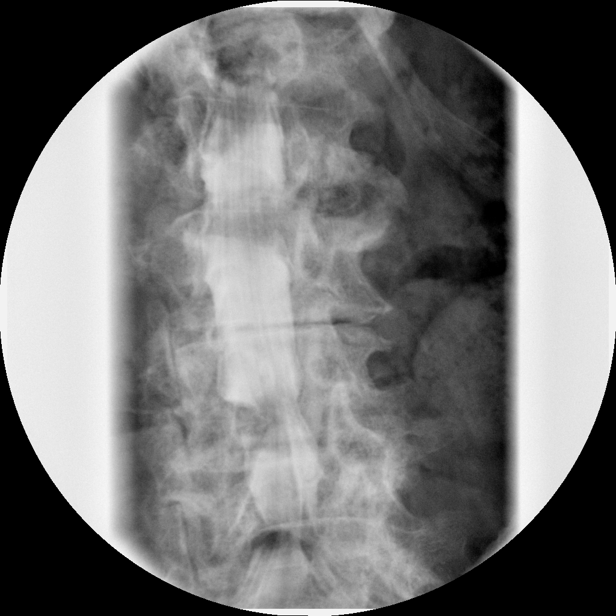

[Series 14: myelogram  white · 1 of 1 slices shown (7 of 11)]
[im 1/1]
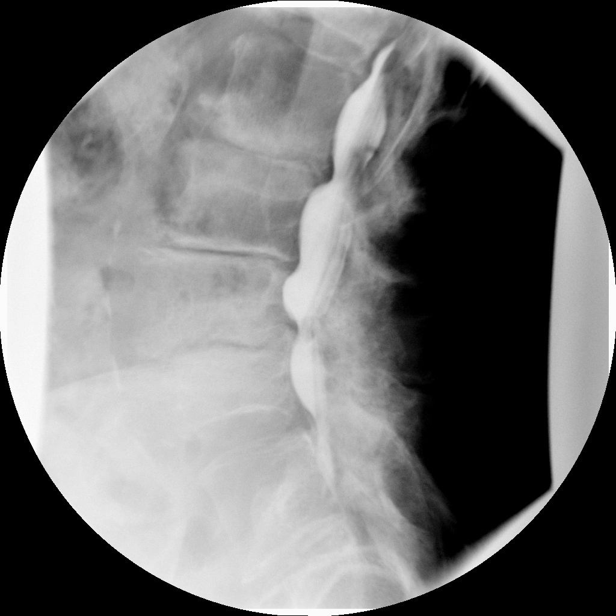

[Series 15: myelogram  white · 1 of 1 slices shown (8 of 11)]
[im 1/1]
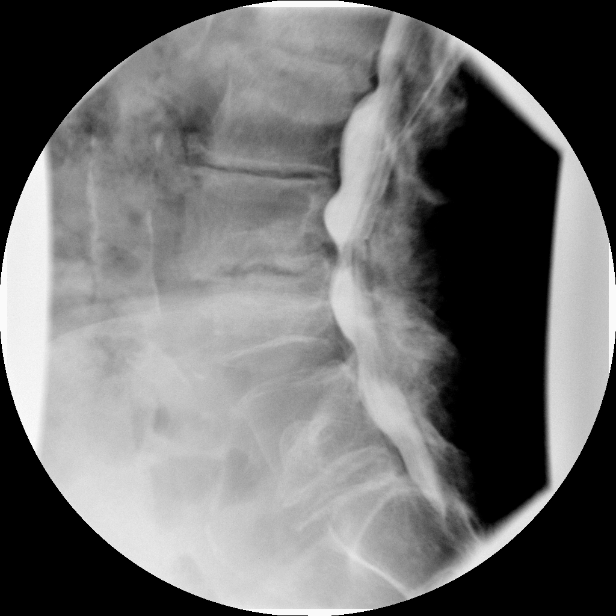

[Series 17: myelogram  white · 1 of 1 slices shown (9 of 11)]
[im 1/1]
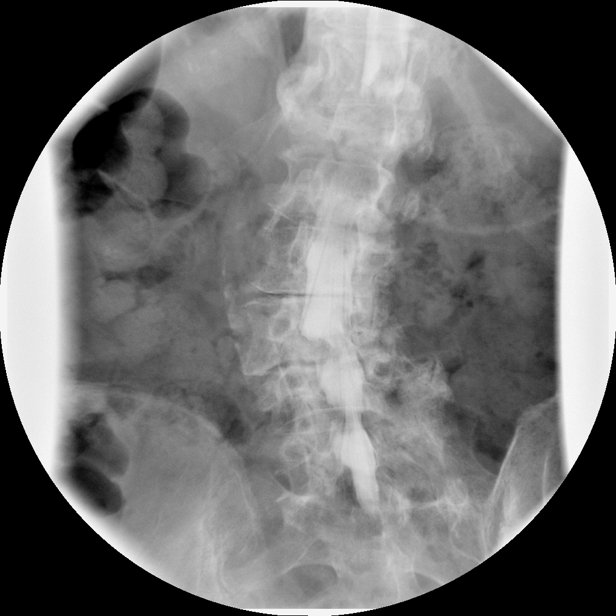

[Series 18: myelogram  white · 1 of 1 slices shown (10 of 11)]
[im 1/1]
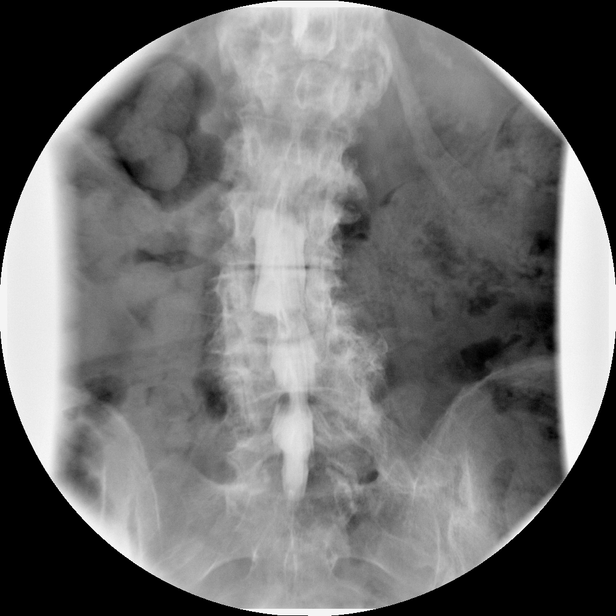

[Series 20: myelogram  white · 1 of 1 slices shown (11 of 11)]
[im 1/1]
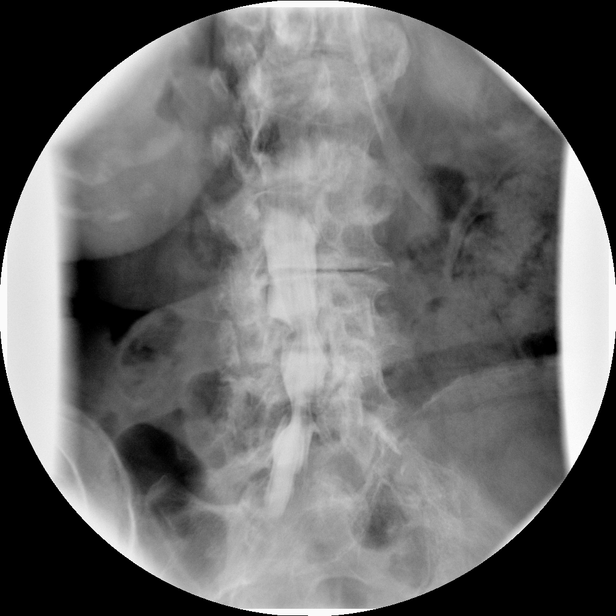

[12 of 18 positions shown; findings below may reference images not displayed]

IMPRESSION: Successful injection of  intrathecal contrast for myelography.

MYELOGRAM LUMBAR
FINDINGS: Transitional anatomy. Numbering scheme used today is that
which was employed on plain films of 06/02/2010.

Good opacification lumbar subarachnoid space.  Large extradural
defect at L3-4 on the left, due to a combination of disc material,
facet mediated slip of 8 mm, and bony overgrowth, compresses the
left L4 nerve root.  Moderate to severe stenosis at T11-T12, T12-L1
and to a lesser degree at L1-2.   Significant T12 compression
fracture with retropulsion of bone approximately 4 mm into the
canal. There is 4 mm facet mediated retrolisthesis L2 on L3.
Mild retrolisthesis L1 on L2 of 4 mm.  Moderate to severe stenosis
L4-L5 with left greater than right L5 nerve root encroachment.
Severe disc space narrowing L3-L4 and L2-L3.

Fluoroscopy Time: 1.36 minutes
IMPRESSION: As above.

CT MYELOGRAPHY LUMBAR SPINE
FINDINGS: No prevertebral or paraspinous masses.  Nonaneurysmal
atherosclerotic calcification of the aorta.

T11-12: Severe stenosis due to 4 mm retropulsion of bone from T12.
Conus medullaris is flattened.  Canal diameter approximately 8 mm.
Bilateral T12 nerve root encroachment is expected.  Fragmentary
changes of the T12 vertebral body are not clearly related to tumor,
but most likely represent post inflammatory or post traumatic
collapse.  Previous biopsy tract in the right T12 pedicle. No
paraspinous abscess or epidural abscess to suggest continued
infection.

T12-L1: Moderate stenosis due to central bulging annular fibers and
posterior element hypertrophy.  Conus compression is worse directly
opposite the T12 retropulsed fragment.  Exiting L1 nerve roots are
at risk.

L1-2: 4 mm retrolisthesis.  Advanced disc space narrowing. Mild
central canal stenosis.  Central protrusion.  Right greater than
left L2 nerve root encroachment.  Neural foraminal narrowing on the
right affects the L1 nerve root.

L2-3:  4 mm retrolisthesis is facet mediated.  Right
hemilaminectomy.  Central osteophyte formation.  Mild annular
bulging. No definite neural compression.

L3-4: 8 mm anterolisthesis.  Right hemilaminectomy.  Severe facet
arthropathy.  Central bulging annular fibers.  Left greater than
right lateral recess encroachment affects the L4 nerve roots.  In
addition, asymmetric neural foraminal narrowing on the left
compresses the L3 nerve root.

L4-5: Central laminectomy.  Advanced facet arthropathy.  Mild
central bulging annular fibers.  Right greater than left L5 nerve
root encroachment due to lateral recess stenosis.

L5-S1: No disc pathology.  Transitional level.  Functional fusion.
IMPRESSION: Transitional anatomy; see comments above.  The compression fracture
is labeled T12 using this scheme.

Severe spinal stenosis at T11-12 and to a lesser degree at T12-L1,
predominately due to a severe compression fracture at T12 with
retropulsion of bone. There is no evidence for paraspinous mass or
epidural abscess at this level.

8 mm of facet mediated slip L3-L4 with vacuum phenomenon and
advanced bony overgrowth.  Severe left L4 nerve root encroachment
in the lateral recess along with left L3 nerve root encroachment in
the foramen.

Severe multilevel spondylosis elsewhere as described above.

## 2012-08-18 ENCOUNTER — Ambulatory Visit (INDEPENDENT_AMBULATORY_CARE_PROVIDER_SITE_OTHER): Payer: Medicare Other | Admitting: Family Medicine

## 2012-08-18 ENCOUNTER — Encounter: Payer: Self-pay | Admitting: Family Medicine

## 2012-08-18 VITALS — BP 127/76 | HR 72 | Wt 202.0 lb

## 2012-08-18 DIAGNOSIS — R609 Edema, unspecified: Secondary | ICD-10-CM

## 2012-08-18 DIAGNOSIS — N951 Menopausal and female climacteric states: Secondary | ICD-10-CM

## 2012-08-18 DIAGNOSIS — R600 Localized edema: Secondary | ICD-10-CM

## 2012-08-18 DIAGNOSIS — I4891 Unspecified atrial fibrillation: Secondary | ICD-10-CM

## 2012-08-18 NOTE — Patient Instructions (Signed)
We will consider compression stockings.

## 2012-08-18 NOTE — Progress Notes (Signed)
  Subjective:    Patient ID: [name], female    DOB: Feb 15, 1932, 79 y.o.   MRN: [id number]  HPI Bilat leg swelling that worsens through-out he day. She recently saw my partner Dr. Emeline Darling. He did an evaluation and felt like this was likely due to venous stasis. She has been taking lasix whole tab daily. She feels it does help some but has not completely resolved or swelling. Renal funciton is normal. No CP or SOB.  Has been bruising more.  Says the lasix runs her to the bathroom. She has noticed some wheezing lately. She says she notices more by the end of the day. No fever cough or sputum production.  Mitral valve dysfunction-she says she can't arm when she was supposed to followup with cardiology. She last saw him a year ago and thinks that she was told to follow up in 6 months but says she never received a phone call. She wonders if she is supposed to return or not. She also has a history of carotid or restenosis and A. fib that has now resolved. It was during an episode of pneumonia.  Review of Systems     Objective:   Physical Exam  Constitutional: She is oriented to person, place, and time. She appears well-developed and well-nourished.  HENT:  Head: Normocephalic and atraumatic.  Cardiovascular: Normal rate, regular rhythm and normal heart sounds.   Pulmonary/Chest: Effort normal and breath sounds normal.  Musculoskeletal:  1+ bilat ankle edema   Neurological: She is alert and oriented to person, place, and time.  Skin: Skin is warm and dry.  Psychiatric: She has a normal mood and affect. Her behavior is normal.          Assessment & Plan:  Bilateral and swelling-most likely related to venous stasis. But she has noticed a little bit more shortness of breath and wheezing lately so we'll get chest x-ray and check a pulse ox. Pulse ox was fairly normal for her. We'll call with the chest x-ray results are available. If it is normal then recommend compression stockings.  He does be very helpful. Though she may need some assistance getting him on. We can always start with a slight grade compression. Says she continues to take the Lasix we will check her BUN and creatinine and make sure that they look okay. She says she actually used to be on a diuretic and potassium at one point in time.  Since she's been on a diuretic we'll check her kidney function BUN today to make sure that she's not drying her kidneys out too much. We will keep an eye on this. We may need to add potassium.  Vital valve disorder/carotid artery stenosis-she is due to see Dr. Kirk Ruths. We will place a referral to get her an appointment with him and Oto.  Due for bone density test in September. Order placed.

## 2012-08-19 LAB — BASIC METABOLIC PANEL WITH GFR
Chloride: 97 mEq/L (ref 96–112)
GFR, Est African American: 51 mL/min — ABNORMAL LOW
GFR, Est Non African American: 44 mL/min — ABNORMAL LOW
Potassium: 3.9 mEq/L (ref 3.5–5.3)
Sodium: 137 mEq/L (ref 135–145)

## 2012-08-24 ENCOUNTER — Telehealth: Payer: Self-pay | Admitting: *Deleted

## 2012-08-24 DIAGNOSIS — N951 Menopausal and female climacteric states: Secondary | ICD-10-CM

## 2012-08-24 NOTE — Telephone Encounter (Signed)
Imaging calls and received order for bone density and pt has [employer] so had to re enter order for the breast center location. Clemetine Marker, LPN

## 2012-08-26 ENCOUNTER — Other Ambulatory Visit: Payer: Self-pay | Admitting: Family Medicine

## 2012-09-07 ENCOUNTER — Encounter: Payer: Self-pay | Admitting: Cardiology

## 2012-09-07 ENCOUNTER — Ambulatory Visit (INDEPENDENT_AMBULATORY_CARE_PROVIDER_SITE_OTHER): Payer: Medicare Other | Admitting: Cardiology

## 2012-09-07 VITALS — BP 120/82 | HR 73 | Wt 201.0 lb

## 2012-09-07 DIAGNOSIS — I4891 Unspecified atrial fibrillation: Secondary | ICD-10-CM

## 2012-09-07 DIAGNOSIS — I059 Rheumatic mitral valve disease, unspecified: Secondary | ICD-10-CM

## 2012-09-07 DIAGNOSIS — R609 Edema, unspecified: Secondary | ICD-10-CM | POA: Insufficient documentation

## 2012-09-07 DIAGNOSIS — I10 Essential (primary) hypertension: Secondary | ICD-10-CM

## 2012-09-07 NOTE — Assessment & Plan Note (Signed)
Blood pressure controlled. Continue present medications. 

## 2012-09-07 NOTE — Assessment & Plan Note (Signed)
Previous episode felt related to pneumonia. She has not had any recurrences off of amiodarone as far as I can tell. Plan to continue beta blocker. Continue aspirin. Previously declined Coumadin.

## 2012-09-07 NOTE — Patient Instructions (Addendum)
Your physician wants you to follow-up in: [place] will receive a reminder letter in the mail two months in advance. If you don't receive a letter, please call our office to schedule the follow-up appointment.   Your physician has requested that you have an echocardiogram. Echocardiography is a painless test that uses sound waves to create images of your heart. It provides your doctor with information about the size and shape of your heart and how well your heart's chambers and valves are working. This procedure takes approximately one hour. There are no restrictions for this procedure.

## 2012-09-07 NOTE — Progress Notes (Signed)
HPI: Pleasant female for FU of atrial fibrillation. The patient was admitted in [place] on January 31 of 2012 with pneumonia and atrial fibrillation. Per the discharge summary echocardiogram showed normal LV function; there was moderately severe mitral regurgitation, mild tricuspid regurgitation and mild pulmonary hypertension. Cardiac markers and TSH were normal. Chest CT showed no pulmonary embolus. She was placed on amiodarone and converted to sinus rhythm. It was felt this could be discontinued in approximately 6-8 weeks as her atrial fibrillation may have been related to her pneumonia. Anticoagulation apparently was suggested but the patient declined. Pt had a nuclear study after DC that was negative by report (records not available). Echocardiogram was repeated in April of 2013 and showed normal LV function, mild aortic insufficiency and mitral regurgitation. I last saw her in June of 2013 and DCed her amiodarone. Since that time, she denies dyspnea on exertion. She notices a wheeze when she lies down for the past 4 months. She also has mild pedal edema. She was placed on Lasix 20 mg daily and her edema has improved. She denies chest pain, palpitations or syncope.   Current Outpatient Prescriptions  Medication Sig Dispense Refill  . amLODipine (NORVASC) 10 MG tablet Take 1 tablet (10 mg total) by mouth daily.  30 tablet  2  . aspirin EC 81 MG tablet Take 1 tablet (81 mg total) by mouth daily.  150 tablet  2  . Calcium Carbonate-Vitamin D (CALTRATE 600+D) 600-400 MG-UNIT per tablet Take 1 tablet by mouth 2 (two) times daily.       . cyanocobalamin 500 MCG tablet Take 500 mcg by mouth daily.      . DULoxetine (CYMBALTA) 60 MG capsule TAKE (1) CAPSULE DAILY.  30 capsule  3  . furosemide (LASIX) 20 MG tablet TAKE 1/2 TO 1 TABLET EVERY MORNING IF NEEDED FOR ANKLE SWELLING.  20 tablet  1  . HYDROcodone-acetaminophen (NORCO/VICODIN) 5-325 MG per tablet TAKE 1 TABLET EVERY SIX HOURS  30 tablet  0    . hydroxychloroquine (PLAQUENIL) 200 MG tablet Take by mouth daily.        . metoprolol (LOPRESSOR) 100 MG tablet TAKE 1 TABLET TWO TIMES DAILY.  60 tablet  3  . oxybutynin (DITROPAN) 5 MG tablet TAKE 1 TABLET TWICE A DAY  60 tablet  3  . sucralfate (CARAFATE) 1 G tablet TAKE 1 TABLET TWICE A DAY  60 tablet  3  . [DISCONTINUED] oxybutynin (DITROPAN) 5 MG tablet TAKE 1 TABLET TWICE A DAY  60 tablet  1   No current facility-administered medications for this visit.     Past Medical History  Diagnosis Date  . Hyperlipidemia   . Atrial fibrillation     setting of pneumonia  . Anxiety   . Lupus erythematosus     discoid  . Peripheral neuropathy   . IBS (irritable bowel syndrome)   . Adenocarcinoma     breast, right  . Hemorrhoids   . Renal insufficiency   . Gastric ulcer   . Headache(784.0)     chronic  . Lumbar disc disease   . Anemia     iron deficiency- NOS  . Depression   . Diabetic retinopathy     Dr Jodi Mourning  . DDD (degenerative disc disease), lumbar     Dr Owens Shark  . Morton neuroma     right- Dr Wardell Honour  . PUD (peptic ulcer disease)   . Discoid lupus   . Hypertension     oral meds  Past Surgical History  Procedure Laterality Date  . Abdominal hysterectomy      1 oophorectomy  . Total knee arthroplasty      both- Dr Devona Konig  . Lumbar laminectomy    . Right partial mastectomy w/sentinel lymph node biopsy  11-09  . Cholecystectomy  08/2005    History   Social History  . Marital Status: Widowed    Spouse Name: N/A    Number of Children: 3  . Years of Education: N/A   Occupational History  . Retired    Social History Main Topics  . Smoking status: Never Smoker   . Smokeless tobacco: Never Used  . Alcohol Use: 0.6 oz/week    1 Glasses of wine per week     Comment: occasional  . Drug Use: No  . Sexual Activity: Not on file   Other Topics Concern  . Not on file   Social History Narrative  . No narrative on file    ROS: no fevers or chills,  productive cough, hemoptysis, dysphasia, odynophagia, melena, hematochezia, dysuria, hematuria, rash, seizure activity, orthopnea, PND, claudication. Remaining systems are negative.  Physical Exam: Well-developed well-nourished in no acute distress.  Skin is warm and dry.  HEENT is normal.  Neck is supple.  Chest is clear to auscultation with normal expansion.  Cardiovascular exam is regular rate and rhythm.  Abdominal exam nontender or distended. No masses palpated. Extremities show trace edema. neuro grossly intact  ECG sinus rhythm at a rate of 73. Left anterior fascicular block. Incomplete right bundle branch block.

## 2012-09-07 NOTE — Assessment & Plan Note (Signed)
Patient describes some wheezing with lying down at night and mild pedal edema which has improved with Lasix. Continue present dose. Plan repeat echocardiogram to assess LV systolic and diastolic function.

## 2012-09-08 ENCOUNTER — Encounter: Payer: Self-pay | Admitting: Family Medicine

## 2012-09-08 ENCOUNTER — Ambulatory Visit (INDEPENDENT_AMBULATORY_CARE_PROVIDER_SITE_OTHER): Payer: Medicare Other | Admitting: Family Medicine

## 2012-09-08 VITALS — BP 143/82 | HR 69 | Wt 200.0 lb

## 2012-09-08 DIAGNOSIS — I10 Essential (primary) hypertension: Secondary | ICD-10-CM

## 2012-09-08 DIAGNOSIS — I878 Other specified disorders of veins: Secondary | ICD-10-CM

## 2012-09-08 DIAGNOSIS — I872 Venous insufficiency (chronic) (peripheral): Secondary | ICD-10-CM

## 2012-09-08 DIAGNOSIS — M7989 Other specified soft tissue disorders: Secondary | ICD-10-CM

## 2012-09-08 NOTE — Progress Notes (Signed)
  Subjective:    Patient ID: [name], female    DOB: [birth year], 79 y.o.   MRN: [id number]  HPI Saw cardiology yesterday. Has echo scheduled on 08/15/12 to look for CHF with leg edema and wheezing laying flat. Still some edema in both lower legs. Feels like it is some better. She does get some tightness in her ankles that becomes uncomfortable. No changes with meds. She has been cutting back on the salt in her diet. She was previously salt in her tomatoes but says has stopped doing that. She's even avoiding eating saltines.  Review of Systems     Objective:   Physical Exam  Constitutional: She is oriented to person, place, and time. She appears well-developed and well-nourished.  HENT:  Head: Normocephalic and atraumatic.  Cardiovascular: Normal rate, regular rhythm and normal heart sounds.   Pulmonary/Chest: Effort normal and breath sounds normal.  Neurological: She is alert and oriented to person, place, and time.  Skin: Skin is warm and dry.  Psychiatric: She has a normal mood and affect. Her behavior is normal.          Assessment & Plan:  Leg swelling- still most likely consistent with venous stasis. She does have an echocardiogram set up which I think is a great idea just to make sure there's no sign of changes with her heart. Right now I will hold off on adjusting her Lasix as her BUN is still mildly elevated and creatinine is stable. I do need to recheck her kidney function electrolytes today.  See if need potassium with her lasix, since now taking it daily except for Sunday.  Will recheck her levels today.  We discussed the importance of continuing to eat a low-salt diet, keeping legs elevated when she's able, and wearing compression stockings.

## 2012-09-09 LAB — BASIC METABOLIC PANEL WITH GFR
BUN: 26 mg/dL — ABNORMAL HIGH (ref 6–23)
CO2: 27 mEq/L (ref 19–32)
Chloride: 100 mEq/L (ref 96–112)
Creat: 1.08 mg/dL (ref 0.50–1.10)
GFR, Est Non African American: 50 mL/min — ABNORMAL LOW
Potassium: 3.9 mEq/L (ref 3.5–5.3)

## 2012-09-13 ENCOUNTER — Telehealth: Payer: Self-pay | Admitting: Cardiology

## 2012-09-13 NOTE — Telephone Encounter (Signed)
Spoke with pt, when she was here she could not remember why she could not take blood thinners. According to the pts PCP she is unable to take blood thinners due to a bleeding ulcer. Will forward for dr Stanford Breed review

## 2012-09-13 NOTE — Telephone Encounter (Signed)
New Prob     Pt states she would like to speak to the nurse regarding some info she would like to provider for her chart.

## 2012-09-13 NOTE — Telephone Encounter (Signed)
Patient declined coumadin previously Kirk Ruths

## 2012-09-15 ENCOUNTER — Ambulatory Visit (HOSPITAL_COMMUNITY): Payer: Medicare Other | Attending: Cardiology

## 2012-09-15 DIAGNOSIS — I059 Rheumatic mitral valve disease, unspecified: Secondary | ICD-10-CM

## 2012-09-15 DIAGNOSIS — I4891 Unspecified atrial fibrillation: Secondary | ICD-10-CM | POA: Insufficient documentation

## 2012-09-15 DIAGNOSIS — I079 Rheumatic tricuspid valve disease, unspecified: Secondary | ICD-10-CM | POA: Insufficient documentation

## 2012-09-15 DIAGNOSIS — R609 Edema, unspecified: Secondary | ICD-10-CM

## 2012-09-15 DIAGNOSIS — I10 Essential (primary) hypertension: Secondary | ICD-10-CM | POA: Insufficient documentation

## 2012-09-15 DIAGNOSIS — E785 Hyperlipidemia, unspecified: Secondary | ICD-10-CM | POA: Insufficient documentation

## 2012-09-15 DIAGNOSIS — I359 Nonrheumatic aortic valve disorder, unspecified: Secondary | ICD-10-CM | POA: Insufficient documentation

## 2012-09-15 NOTE — Progress Notes (Signed)
Echocardiogram performed.  

## 2012-10-12 ENCOUNTER — Other Ambulatory Visit: Payer: Self-pay | Admitting: Family Medicine

## 2012-11-16 ENCOUNTER — Other Ambulatory Visit: Payer: Self-pay | Admitting: Family Medicine

## 2012-11-18 ENCOUNTER — Telehealth: Payer: Self-pay | Admitting: *Deleted

## 2012-11-18 MED ORDER — AMBULATORY NON FORMULARY MEDICATION: Status: DC

## 2012-11-18 NOTE — Telephone Encounter (Signed)
Reordered shingles vaccine rx.

## 2012-11-23 ENCOUNTER — Other Ambulatory Visit: Payer: Self-pay | Admitting: Family Medicine

## 2012-11-28 ENCOUNTER — Ambulatory Visit: Payer: Medicare Other | Admitting: Family Medicine

## 2012-11-29 ENCOUNTER — Ambulatory Visit (INDEPENDENT_AMBULATORY_CARE_PROVIDER_SITE_OTHER): Payer: Medicare Other | Admitting: Family Medicine

## 2012-11-29 ENCOUNTER — Encounter: Payer: Self-pay | Admitting: Family Medicine

## 2012-11-29 VITALS — BP 144/73 | HR 65 | Wt 204.0 lb

## 2012-11-29 DIAGNOSIS — I10 Essential (primary) hypertension: Secondary | ICD-10-CM

## 2012-11-29 DIAGNOSIS — Z23 Encounter for immunization: Secondary | ICD-10-CM

## 2012-11-29 DIAGNOSIS — L930 Discoid lupus erythematosus: Secondary | ICD-10-CM

## 2012-11-29 DIAGNOSIS — E669 Obesity, unspecified: Secondary | ICD-10-CM | POA: Insufficient documentation

## 2012-11-29 DIAGNOSIS — R7301 Impaired fasting glucose: Secondary | ICD-10-CM

## 2012-11-29 MED ORDER — HYDROCODONE-ACETAMINOPHEN 5-325 MG PO TABS: ORAL_TABLET | ORAL | Status: DC

## 2012-11-29 NOTE — Progress Notes (Signed)
Subjective:    Patient ID: [name], female    DOB: 05-25-1932, 79 y.o.   MRN: [id number]  HPI HTN-  Pt denies chest pain, SOB, dizziness, or heart palpitations.  Taking meds as directed w/o problems.  Denies medication side effects.    SLE - on plaquenil.  Would like a new rheum bc having a hard time traveling so far.  Likes her rhuem but can't travel so much.  Her arthriitis has been worse the last 3 days.  She has been hurting more with the cold weather. Uses half a pain pill at bedtime. Due for eye exam.  Impaired fasting glucose-no hypoglycemic events. No increased urination or thirst. Last A1c was in January. Review of Systems     BP 144/73  Pulse 65  Wt 204 lb (92.534 kg)    Allergies  Allergen Reactions  . Aspirin Other (See Comments)    ulcer  . Gabapentin Hives and Itching  . Oxycodone Other (See Comments)    Pt unsure  . Simvastatin Other (See Comments)    Lost the use of her legs  . Sulfonamide Derivatives Hives and Itching  . Beta Adrenergic Blockers   . Codeine   . Hydrochlorothiazide Other (See Comments)    INcreaseed BUN/Creatinine  . Lisinopril Other (See Comments)    Increased BUN/CR    Past Medical History  Diagnosis Date  . Hyperlipidemia   . Atrial fibrillation     setting of pneumonia  . Anxiety   . Lupus erythematosus     discoid  . Peripheral neuropathy   . IBS (irritable bowel syndrome)   . Adenocarcinoma     breast, right  . Hemorrhoids   . Renal insufficiency   . Gastric ulcer   . Headache(784.0)     chronic  . Lumbar disc disease   . Anemia     iron deficiency- NOS  . Depression   . Diabetic retinopathy     Dr Jodi Mourning  . DDD (degenerative disc disease), lumbar     Dr Owens Shark  . Morton neuroma     right- Dr Wardell Honour  . PUD (peptic ulcer disease)   . Discoid lupus   . Hypertension     oral meds    Past Surgical History  Procedure Laterality Date  . Abdominal hysterectomy      1 oophorectomy  . Total knee  arthroplasty      both- Dr Devona Konig  . Lumbar laminectomy    . Right partial mastectomy w/sentinel lymph node biopsy  11-09  . Cholecystectomy  08/2005    History   Social History  . Marital Status: Widowed    Spouse Name: N/A    Number of Children: 3  . Years of Education: N/A   Occupational History  . Retired    Social History Main Topics  . Smoking status: Never Smoker   . Smokeless tobacco: Never Used  . Alcohol Use: 0.6 oz/week    1 Glasses of wine per week     Comment: occasional  . Drug Use: No  . Sexual Activity: Not on file   Other Topics Concern  . Not on file   Social History Narrative  . No narrative on file    Family History  Problem Relation Age of Onset  . Diabetes Mother   . Angina Mother   . COPD Father     colon  . Diabetes Father   . Stroke Father   . Hyperlipidemia Father   .  Colon cancer Father     Outpatient Encounter Prescriptions as of 11/29/2012  Medication Sig  . AMBULATORY NON FORMULARY MEDICATION zostavax vaccine Disp 1 vial  Use as directed 0 refills  . amLODipine (NORVASC) 10 MG tablet Take 1 tablet (10 mg total) by mouth daily.  Marland Kitchen aspirin EC 81 MG tablet Take 1 tablet (81 mg total) by mouth daily.  . Calcium Carbonate-Vitamin D (CALTRATE 600+D) 600-400 MG-UNIT per tablet Take 1 tablet by mouth 2 (two) times daily.   . cyanocobalamin 500 MCG tablet Take 500 mcg by mouth daily.  . DULoxetine (CYMBALTA) 60 MG capsule TAKE (1) CAPSULE DAILY.  . furosemide (LASIX) 20 MG tablet Take 20 mg by mouth daily.   Marland Kitchen HYDROcodone-acetaminophen (NORCO/VICODIN) 5-325 MG per tablet QD PRN  . hydroxychloroquine (PLAQUENIL) 200 MG tablet Take by mouth daily.    . metoprolol (LOPRESSOR) 100 MG tablet TAKE 1 TABLET TWO TIMES DAILY.  Marland Kitchen oxybutynin (DITROPAN) 5 MG tablet TAKE 1 TABLET TWICE A DAY  . sucralfate (CARAFATE) 1 G tablet TAKE 1 TABLET TWICE A DAY  . [DISCONTINUED] HYDROcodone-acetaminophen (NORCO/VICODIN) 5-325 MG per tablet TAKE 1 TABLET  EVERY SIX HOURS       Objective:   Physical Exam  Constitutional: She is oriented to person, place, and time. She appears well-developed and well-nourished.  HENT:  Head: Normocephalic and atraumatic.  Cardiovascular: Normal rate, regular rhythm and normal heart sounds.   Pulmonary/Chest: Effort normal and breath sounds normal.  Neurological: She is alert and oriented to person, place, and time.  Skin: Skin is warm and dry.  Psychiatric: She has a normal mood and affect. Her behavior is normal.          Assessment & Plan:  HTN - Well controlled.  Continue current regimen.  F/u in 6 months.  IFG - A1C is 5.9. Work on diet and exercise.  F/u in 6 months.  Lab Results  Component Value Date   HGBA1C 5.7 01/26/2012   SLE/joint pain - will refill hydrocodone. She has some left but will give refill today.  Will have to pick up her copies in the future. Discussed the new law requirements today. We'll make referral to new rheumatologist here locally so that she doesn't have to drive all the way into [place].  Reminded due for UTD eye exam.

## 2012-11-29 NOTE — Patient Instructions (Signed)
Try to make an appointment for eye exam

## 2012-12-05 ENCOUNTER — Other Ambulatory Visit: Payer: Self-pay | Admitting: Family Medicine

## 2012-12-09 ENCOUNTER — Other Ambulatory Visit: Payer: Self-pay | Admitting: Family Medicine

## 2012-12-20 ENCOUNTER — Other Ambulatory Visit: Payer: Self-pay | Admitting: Family Medicine

## 2013-01-18 ENCOUNTER — Other Ambulatory Visit: Payer: Self-pay | Admitting: Family Medicine

## 2013-02-06 ENCOUNTER — Other Ambulatory Visit: Payer: Self-pay | Admitting: Family Medicine

## 2013-02-07 ENCOUNTER — Other Ambulatory Visit: Payer: Self-pay | Admitting: *Deleted

## 2013-02-07 MED ORDER — METOPROLOL TARTRATE 100 MG PO TABS: ORAL_TABLET | ORAL | Status: DC

## 2013-02-22 ENCOUNTER — Other Ambulatory Visit: Payer: Self-pay | Admitting: Family Medicine

## 2013-03-08 ENCOUNTER — Other Ambulatory Visit: Payer: Self-pay | Admitting: *Deleted

## 2013-03-08 MED ORDER — FUROSEMIDE 20 MG PO TABS: ORAL_TABLET | ORAL | Status: DC

## 2013-03-28 ENCOUNTER — Other Ambulatory Visit: Payer: Self-pay | Admitting: *Deleted

## 2013-03-28 MED ORDER — DULOXETINE HCL 60 MG PO CPEP: ORAL_CAPSULE | ORAL | Status: DC

## 2013-04-06 ENCOUNTER — Other Ambulatory Visit: Payer: Self-pay | Admitting: *Deleted

## 2013-04-06 MED ORDER — AMLODIPINE BESYLATE 10 MG PO TABS: ORAL_TABLET | ORAL | Status: DC

## 2013-05-08 ENCOUNTER — Other Ambulatory Visit: Payer: Self-pay | Admitting: Family Medicine

## 2013-05-22 ENCOUNTER — Other Ambulatory Visit: Payer: Self-pay | Admitting: Family Medicine

## 2013-05-29 ENCOUNTER — Other Ambulatory Visit: Payer: Self-pay | Admitting: Family Medicine

## 2013-05-29 ENCOUNTER — Ambulatory Visit (INDEPENDENT_AMBULATORY_CARE_PROVIDER_SITE_OTHER): Payer: Medicare Other | Admitting: Family Medicine

## 2013-05-29 ENCOUNTER — Encounter: Payer: Self-pay | Admitting: Family Medicine

## 2013-05-29 VITALS — BP 150/75 | HR 67 | Wt 204.0 lb

## 2013-05-29 DIAGNOSIS — R609 Edema, unspecified: Secondary | ICD-10-CM

## 2013-05-29 DIAGNOSIS — I10 Essential (primary) hypertension: Secondary | ICD-10-CM

## 2013-05-29 DIAGNOSIS — R7301 Impaired fasting glucose: Secondary | ICD-10-CM

## 2013-05-29 LAB — COMPLETE METABOLIC PANEL WITH GFR
ALT: 13 U/L (ref 0–35)
AST: 20 U/L (ref 0–37)
Albumin: 4.2 g/dL (ref 3.5–5.2)
Alkaline Phosphatase: 77 U/L (ref 39–117)
BILIRUBIN TOTAL: 0.6 mg/dL (ref 0.2–1.2)
BUN: 23 mg/dL (ref 6–23)
CO2: 29 meq/L (ref 19–32)
Calcium: 10.2 mg/dL (ref 8.4–10.5)
Chloride: 101 mEq/L (ref 96–112)
Creat: 1.01 mg/dL (ref 0.50–1.10)
GFR, EST AFRICAN AMERICAN: 62 mL/min
GFR, EST NON AFRICAN AMERICAN: 53 mL/min — AB
GLUCOSE: 99 mg/dL (ref 70–99)
Potassium: 4.1 mEq/L (ref 3.5–5.3)
SODIUM: 141 meq/L (ref 135–145)
TOTAL PROTEIN: 6.9 g/dL (ref 6.0–8.3)

## 2013-05-29 LAB — LIPID PANEL
CHOLESTEROL: 227 mg/dL — AB (ref 0–200)
HDL: 79 mg/dL (ref >39)
LDL Cholesterol: 127 mg/dL — ABNORMAL HIGH (ref 0–99)
Total CHOL/HDL Ratio: 2.9 Ratio
Triglycerides: 105 mg/dL (ref <150)
VLDL: 21 mg/dL (ref 0–40)

## 2013-05-29 LAB — POCT GLYCOSYLATED HEMOGLOBIN (HGB A1C): Hemoglobin A1C: 5.8

## 2013-05-29 NOTE — Progress Notes (Signed)
Subjective:    Patient ID: [name], female    DOB: 04/03/1932, 80 y.o.   MRN: [id number]  HPI Hypertension- Pt denies chest pain, SOB, dizziness, or heart palpitations.  Taking meds as directed w/o problems.  Denies medication side effects.  Home BPS have been well controlled  Impired fasting glucose - No inc thrist or urination.    Ate pretzels over the weekend and had some lower extremity swelling.  Swelling is better today. She also uses IBU prn for pain for her lupus.  Her rheumatologits wants her to consider celebrex over the IBU she has been using.    Review of Systems     BP 150/75  Pulse 67  Wt 204 lb (92.534 kg)    Allergies  Allergen Reactions  . Aspirin Other (See Comments)    ulcer  . Gabapentin Hives and Itching  . Oxycodone Other (See Comments)    Pt unsure  . Simvastatin Other (See Comments)    Lost the use of her legs  . Sulfonamide Derivatives Hives and Itching  . Beta Adrenergic Blockers   . Codeine   . Hydrochlorothiazide Other (See Comments)    INcreaseed BUN/Creatinine  . Lisinopril Other (See Comments)    Increased BUN/CR    Past Medical History  Diagnosis Date  . Hyperlipidemia   . Atrial fibrillation     setting of pneumonia  . Anxiety   . Lupus erythematosus     discoid  . Peripheral neuropathy   . IBS (irritable bowel syndrome)   . Adenocarcinoma     breast, right  . Hemorrhoids   . Renal insufficiency   . Gastric ulcer   . Headache(784.0)     chronic  . Lumbar disc disease   . Anemia     iron deficiency- NOS  . Depression   . Diabetic retinopathy     Dr Jodi Mourning  . DDD (degenerative disc disease), lumbar     Dr Owens Shark  . Morton neuroma     right- Dr Wardell Honour  . PUD (peptic ulcer disease)   . Discoid lupus   . Hypertension     oral meds    Past Surgical History  Procedure Laterality Date  . Abdominal hysterectomy      1 oophorectomy  . Total knee arthroplasty      both- Dr Devona Konig  . Lumbar laminectomy     . Right partial mastectomy w/sentinel lymph node biopsy  11-09  . Cholecystectomy  08/2005    History   Social History  . Marital Status: Widowed    Spouse Name: N/A    Number of Children: 3  . Years of Education: N/A   Occupational History  . Retired    Social History Main Topics  . Smoking status: Never Smoker   . Smokeless tobacco: Never Used  . Alcohol Use: 0.6 oz/week    1 Glasses of wine per week     Comment: occasional  . Drug Use: No  . Sexual Activity: Not on file   Other Topics Concern  . Not on file   Social History Narrative  . No narrative on file    Family History  Problem Relation Age of Onset  . Diabetes Mother   . Angina Mother   . COPD Father     colon  . Diabetes Father   . Stroke Father   . Hyperlipidemia Father   . Colon cancer Father     Outpatient Encounter Prescriptions as of  05/29/2013  Medication Sig  . AMBULATORY NON FORMULARY MEDICATION zostavax vaccine Disp 1 vial  Use as directed 0 refills  . amLODipine (NORVASC) 10 MG tablet TAKE 1 TABLET DAILY.  Marland Kitchen aspirin EC 81 MG tablet Take 1 tablet (81 mg total) by mouth daily.  . Calcium Carbonate-Vitamin D (CALTRATE 600+D) 600-400 MG-UNIT per tablet Take 1 tablet by mouth 2 (two) times daily.   . cyanocobalamin 500 MCG tablet Take 500 mcg by mouth daily.  . DULoxetine (CYMBALTA) 60 MG capsule TAKE (1) CAPSULE DAILY.  . furosemide (LASIX) 20 MG tablet TAKE 1/2 TO 1 TABLET EVERY MORNING AS NEEDED FOR ANKLE SWELLING  . hydroxychloroquine (PLAQUENIL) 200 MG tablet Take by mouth daily.    . metoprolol (LOPRESSOR) 100 MG tablet TAKE 1 TABLET TWO TIMES DAILY.  Marland Kitchen oxybutynin (DITROPAN) 5 MG tablet TAKE 1 TABLET TWICE A DAY  . sucralfate (CARAFATE) 1 G tablet TAKE 1 TABLET TWICE A DAY  . [DISCONTINUED] HYDROcodone-acetaminophen (NORCO/VICODIN) 5-325 MG per tablet QD PRN       Objective:   Physical Exam  Constitutional: She is oriented to person, place, and time. She appears well-developed  and well-nourished.  HENT:  Head: Normocephalic and atraumatic.  Cardiovascular: Normal rate, regular rhythm and normal heart sounds.   Pulmonary/Chest: Effort normal and breath sounds normal.  Musculoskeletal:  1+ pitting edema in both lower extremities.  Neurological: She is alert and oriented to person, place, and time.  Skin: Skin is warm and dry.  Psychiatric: She has a normal mood and affect. Her behavior is normal.          Assessment & Plan:  Hypertension- Up some today. Well controlled. F/U in 6 monts.   Impaired fasting glucose- 5.8 well controlled. F/U in 6 months.    Left lower extremiity - can take extra lasix if needed for swelling but if using extra frequently we  Need to check kidneys funciton and electrolytes. Explained her Lasix can cause her to lose potassium so she is using more than usual that we need to keep an eye on this.  Atrial fibrillation-has followup with cardiology soon.

## 2013-07-12 ENCOUNTER — Other Ambulatory Visit: Payer: Self-pay | Admitting: Family Medicine

## 2013-07-18 ENCOUNTER — Other Ambulatory Visit: Payer: Self-pay | Admitting: *Deleted

## 2013-07-18 MED ORDER — DULOXETINE HCL 60 MG PO CPEP: ORAL_CAPSULE | ORAL | Status: DC

## 2013-07-28 ENCOUNTER — Other Ambulatory Visit: Payer: Self-pay | Admitting: Family Medicine

## 2013-08-03 ENCOUNTER — Other Ambulatory Visit: Payer: Self-pay | Admitting: Family Medicine

## 2013-08-15 ENCOUNTER — Telehealth: Payer: Self-pay | Admitting: *Deleted

## 2013-08-15 NOTE — Telephone Encounter (Signed)
Pt called and stated that she is still having a lot of swelling in her ankles. She has been taking 1 lasix qd I asked if she has been keeping her feet elevated and she stated that she has as much as she possibly could. I looked back at her last OV and Dr. Madilyn Fireman stated that she could increase the lasix so I told her to increase to 2 tabs daily to see if this helps. She stated that she would do that and call back to let me know how this is working for her. Maryruth Eve, Lahoma Crocker

## 2013-08-17 ENCOUNTER — Other Ambulatory Visit: Payer: Self-pay | Admitting: Family Medicine

## 2013-09-04 ENCOUNTER — Other Ambulatory Visit: Payer: Self-pay | Admitting: Family Medicine

## 2013-09-11 ENCOUNTER — Encounter: Payer: Self-pay | Admitting: Family Medicine

## 2013-09-11 ENCOUNTER — Ambulatory Visit (INDEPENDENT_AMBULATORY_CARE_PROVIDER_SITE_OTHER): Payer: Medicare Other | Admitting: Family Medicine

## 2013-09-11 VITALS — BP 132/72 | HR 75 | Wt 209.0 lb

## 2013-09-11 DIAGNOSIS — N259 Disorder resulting from impaired renal tubular function, unspecified: Secondary | ICD-10-CM

## 2013-09-11 DIAGNOSIS — R609 Edema, unspecified: Secondary | ICD-10-CM

## 2013-09-11 MED ORDER — AMBULATORY NON FORMULARY MEDICATION: Status: DC

## 2013-09-11 NOTE — Patient Instructions (Signed)
[facility] ?  Address: [address] [place], [place] [address]  Phone:([phone]) [place] [address], [place] [id number]   If compression stockings are not effective increase furosemide to 40mg  every morning and 20mg  every afternoon as long as you're able to follow up for a kidney function test a few days after this change.

## 2013-09-11 NOTE — Progress Notes (Signed)
CC: [name] is a 80 y.o. female is here for Leg Swelling   Subjective: HPI:  Complains of bilateral leg swelling that is localized from the shins distally into this if it is present on a daily basis moderate in severity. Mild in severity in the morning but as the day progresses swelling progresses as well. Slight improvement with elevation of legs only temporarily though. It is always symmetric and never with any overlying discoloration or discharge.  Symptoms have been present for over a year now but more problematic over the last month. Symptoms are present on a daily basis. Interventions have included taking 20 mg in the afternoon and again right before bed which did not help. she's currently taking 20 mg in the morning and 20 mg at bedtime with only mild improvement of symptoms. Symptoms have not been accompanied by irregular heartbeat, chest pain, shortness of breath, nor edema elsewhere. Denies fevers, chills, unintentional weight gain, nor constipation.   Review Of Systems Outlined In HPI  Past Medical History  Diagnosis Date  . Hyperlipidemia   . Atrial fibrillation     setting of pneumonia  . Anxiety   . Lupus erythematosus     discoid  . Peripheral neuropathy   . IBS (irritable bowel syndrome)   . Adenocarcinoma     breast, right  . Hemorrhoids   . Renal insufficiency   . Gastric ulcer   . Headache(784.0)     chronic  . Lumbar disc disease   . Anemia     iron deficiency- NOS  . Depression   . Diabetic retinopathy     Dr Jodi Mourning  . DDD (degenerative disc disease), lumbar     Dr Owens Shark  . Morton neuroma     right- Dr Wardell Honour  . PUD (peptic ulcer disease)   . Discoid lupus   . Hypertension     oral meds    Past Surgical History  Procedure Laterality Date  . Abdominal hysterectomy      1 oophorectomy  . Total knee arthroplasty      both- Dr Devona Konig  . Lumbar laminectomy    . Right partial mastectomy w/sentinel lymph node biopsy  11-09  .  Cholecystectomy  08/2005   Family History  Problem Relation Age of Onset  . Diabetes Mother   . Angina Mother   . COPD Father     colon  . Diabetes Father   . Stroke Father   . Hyperlipidemia Father   . Colon cancer Father     History   Social History  . Marital Status: Widowed    Spouse Name: N/A    Number of Children: 3  . Years of Education: N/A   Occupational History  . Retired    Social History Main Topics  . Smoking status: Never Smoker   . Smokeless tobacco: Never Used  . Alcohol Use: 0.6 oz/week    1 Glasses of wine per week     Comment: occasional  . Drug Use: No  . Sexual Activity: Not on file   Other Topics Concern  . Not on file   Social History Narrative  . No narrative on file     Objective: BP 132/72  Pulse 75  Wt 209 lb (94.802 kg)  General: Alert and Oriented, No Acute Distress HEENT: Pupils equal, round, reactive to light. Conjunctivae clear.  Moist he does membranes pharynx unremarkable Lungs: Clear to auscultation bilaterally, no wheezing/ronchi/rales.  Comfortable work of breathing. Good air  movement. Cardiac: Regular rate and rhythm. Normal S1/S2.  No murmurs, rubs, nor gallops.   Abdomen: Obese and soft Extremities: 1+ pitting edema from the shins distally into the feet, symmetric, no overlying skin changes at the site of edema nor edema elsewhere in the body.  Strong peripheral pulses.  Mental Status: No depression, anxiety, nor agitation. Skin: Warm and dry.  Assessment & Plan: [name] was seen today for leg swelling.  Diagnoses and associated orders for this visit:  Edema - AMBULATORY NON FORMULARY MEDICATION; Knee High Compression Stockings: Please fit to size with pressure of 20-7mmHg.  Wearing during all waking hours unless in water.  Dx: Lower extremity edema.  RENAL INSUFFICIENCY    Edema: Suspect the majority of this is due to venous insufficiency since she shows no other signs of heart failure or new hypothyroidism.   First step would be compression stockings as described above, to be worn during all waking hours. If no benefit I've recommended that she increase her morning dose of Lasix to 40 mg, and regardless of what she does above to no longer take a 20 mg dose before bed but instead this 20 mg at lunchtime Renal insufficiency: I tried providing her with a metabolic panel that she could submit a few days after increasing Lasix if need be, she politely declines and will wait to see if she can have blood work done when she's returns for Medicare wellness exam  Return in about 2 weeks (around 09/25/2013).

## 2013-09-26 ENCOUNTER — Ambulatory Visit (INDEPENDENT_AMBULATORY_CARE_PROVIDER_SITE_OTHER): Payer: Medicare Other | Admitting: Family Medicine

## 2013-09-26 ENCOUNTER — Encounter: Payer: Self-pay | Admitting: Family Medicine

## 2013-09-26 VITALS — BP 131/70 | HR 86 | Wt 203.0 lb

## 2013-09-26 DIAGNOSIS — Z0000 Encounter for general adult medical examination without abnormal findings: Secondary | ICD-10-CM

## 2013-09-26 DIAGNOSIS — R609 Edema, unspecified: Secondary | ICD-10-CM

## 2013-09-26 MED ORDER — SUCRALFATE 1 G PO TABS: ORAL_TABLET | ORAL | Status: DC

## 2013-09-26 NOTE — Progress Notes (Signed)
Subjective:    [name] is a 80 y.o. female who presents for Medicare Annual/Subsequent preventive examination.  Preventive Screening-Counseling & Management  Tobacco History  Smoking status  . Never Smoker   Smokeless tobacco  . Never Used     Problems Prior to Visit 1. still having significant lower extremity edema. She's not even able to put the compression stockings on. She was encouraged to try next dose of Lasix but she says she has not done that yet.  Current Problems (verified) Patient Active Problem List   Diagnosis Date Noted  . Obesity (BMI 30-39.9) 11/29/2012  . Edema 09/07/2012  . Peripheral neuropathy 05/26/2012  . Obesity 01/16/2011  . Discitis 08/06/2010  . MITRAL VALVE DISORDERS 03/12/2010  . HEART MURMUR, HX OF 03/11/2010  . FIBRILLATION, ATRIAL 02/28/2010  . MUSCLE SPASM, BACK 02/28/2010  . ANXIETY STATE, UNSPECIFIED 09/04/2009  . LUPUS ERYTHEMATOSUS, DISCOID 03/28/2008  . PERIPHERAL NEUROPATHY 11/29/2007  . IRRITABLE BOWEL SYNDROME 11/29/2007  . ADENOCARCINOMA, BREAST, RIGHT 11/11/2007  . IMPAIRED FASTING GLUCOSE 06/01/2007  . HEMORRHOIDS 05/28/2007  . GASTRIC ULCER 05/28/2007  . HEEL SPUR 05/20/2007  . CAROTID ARTERY STENOSIS 01/27/2007  . HEADACHE, CHRONIC 01/27/2007  . RENAL FAILURE, CHRONIC 09/15/2006  . [place] DISEASE, LUMBAR 04/27/2006  . ANEMIA, IRON DEFICIENCY NOS 04/16/2006  . FISSURE, ANAL 03/30/2006  . HYPERLIPIDEMIA 03/12/2006  . DEPRESSION 03/12/2006  . HYPERTENSION, BENIGN ESSENTIAL 03/12/2006    Medications Prior to Visit Current Outpatient Prescriptions on File Prior to Visit  Medication Sig Dispense Refill  . AMBULATORY NON FORMULARY MEDICATION zostavax vaccine Disp 1 vial  Use as directed 0 refills  1 vial  0  . AMBULATORY NON FORMULARY MEDICATION Knee High Compression Stockings: Please fit to size with pressure of 20-43mmHg.  Wearing during all waking hours unless in water.  Dx: Lower extremity edema.  1 Units  1   . amLODipine (NORVASC) 10 MG tablet TAKE ONE TABLET DAILY  30 tablet  4  . aspirin EC 81 MG tablet Take 1 tablet (81 mg total) by mouth daily.  150 tablet  2  . Calcium Carbonate-Vitamin D (CALTRATE 600+D) 600-400 MG-UNIT per tablet Take 1 tablet by mouth 2 (two) times daily.       . cyanocobalamin 500 MCG tablet Take 500 mcg by mouth daily.      . DULoxetine (CYMBALTA) 60 MG capsule TAKE (1) CAPSULE DAILY.  30 capsule  3  . furosemide (LASIX) 20 MG tablet Take 1 tablet (20 mg total) by mouth 2 (two) times daily.  60 tablet  0  . hydroxychloroquine (PLAQUENIL) 200 MG tablet Take by mouth daily.        . metoprolol (LOPRESSOR) 100 MG tablet TAKE 1 TABLET TWO TIMES DAILY.  60 tablet  4  . oxybutynin (DITROPAN) 5 MG tablet TAKE 1 TABLET TWICE A DAY  60 tablet  4  . [DISCONTINUED] oxybutynin (DITROPAN) 5 MG tablet TAKE 1 TABLET TWICE A DAY  60 tablet  1   No current facility-administered medications on file prior to visit.    Current Medications (verified) Current Outpatient Prescriptions  Medication Sig Dispense Refill  . AMBULATORY NON FORMULARY MEDICATION zostavax vaccine Disp 1 vial  Use as directed 0 refills  1 vial  0  . AMBULATORY NON FORMULARY MEDICATION Knee High Compression Stockings: Please fit to size with pressure of 20-18mmHg.  Wearing during all waking hours unless in water.  Dx: Lower extremity edema.  1 Units  1  . amLODipine (  NORVASC) 10 MG tablet TAKE ONE TABLET DAILY  30 tablet  4  . aspirin EC 81 MG tablet Take 1 tablet (81 mg total) by mouth daily.  150 tablet  2  . Calcium Carbonate-Vitamin D (CALTRATE 600+D) 600-400 MG-UNIT per tablet Take 1 tablet by mouth 2 (two) times daily.       . cyanocobalamin 500 MCG tablet Take 500 mcg by mouth daily.      . DULoxetine (CYMBALTA) 60 MG capsule TAKE (1) CAPSULE DAILY.  30 capsule  3  . furosemide (LASIX) 20 MG tablet Take 1 tablet (20 mg total) by mouth 2 (two) times daily.  60 tablet  0  . hydroxychloroquine (PLAQUENIL) 200  MG tablet Take by mouth daily.        . metoprolol (LOPRESSOR) 100 MG tablet TAKE 1 TABLET TWO TIMES DAILY.  60 tablet  4  . oxybutynin (DITROPAN) 5 MG tablet TAKE 1 TABLET TWICE A DAY  60 tablet  4  . sucralfate (CARAFATE) 1 G tablet TAKE 1 TABLET TWICE A DAY  60 tablet  3  . [DISCONTINUED] oxybutynin (DITROPAN) 5 MG tablet TAKE 1 TABLET TWICE A DAY  60 tablet  1   No current facility-administered medications for this visit.     Allergies (verified) Aspirin; Gabapentin; Oxycodone; Simvastatin; Sulfonamide derivatives; Beta adrenergic blockers; Codeine; Hydrochlorothiazide; and Lisinopril   PAST HISTORY  Family History Family History  Problem Relation Age of Onset  . Diabetes Mother   . Angina Mother   . COPD Father     colon  . Diabetes Father   . Stroke Father   . Hyperlipidemia Father   . Colon cancer Father     Social History History  Substance Use Topics  . Smoking status: Never Smoker   . Smokeless tobacco: Never Used  . Alcohol Use: 0.6 oz/week    1 Glasses of wine per week     Comment: occasional     Are there smokers in your home (other than you)? No  Risk Factors Current exercise habits: water aerobic, walking  Dietary issues discussed: None   Cardiac risk factors: advanced age (older than 14 for men, 82 for women) and hypertension.  Depression Screen (Note: if answer to either of the following is "Yes", a more complete depression screening is indicated)   Over the past two weeks, have you felt down, depressed or hopeless? No  Over the past two weeks, have you felt little interest or pleasure in doing things? No  Have you lost interest or pleasure in daily life? No  Do you often feel hopeless? No  Do you cry easily over simple problems? No  Activities of Daily Living In your present state of health, do you have any difficulty performing the following activities?:  Driving? No Managing money?  No Feeding yourself? No Getting from bed to chair? No   Climbing a flight of stairs? Yes Preparing food and eating?: No Bathing or showering? No Getting dressed: No Getting to the toilet? No Using the toilet:No Moving around from place to place: No In the past year have you fallen or had a near fall?:No   Are you sexually active?  No  Do you have more than one partner?  No  Hearing Difficulties: Yes Do you often ask people to speak up or repeat themselves? Yes Do you experience ringing or noises in your ears? Yes Do you have difficulty understanding soft or whispered voices? No   Do you feel that  you have a problem with memory? Yes  Do you often misplace items? No  Do you feel safe at home?  Yes  Cognitive Testing  Alert? Yes  Normal Appearance?Yes  Oriented to person? Yes  Place? Yes   Time? Yes  Recall of three objects?  Yes  Can perform simple calculations? Yes Displays appropriate judgment?Yes  Can read the correct time from a watch face?Yes   6 CIT test score of 0/28 (normal)    Advanced Directives have been discussed with the patient? Yes  List the Names of Other Physician/Practitioners you currently use: 1.  Dr. Erling Conte 2. Dr. Georgiann Cocker. 3. Dr. Ma Hillock any recent Medical Services you may have received from other than [facility] providers in the past year (date may be approximate).  Immunization History  Administered Date(s) Administered  . H1N1 12/22/2007  . Influenza Split 09/25/2011  . Influenza Whole 11/09/2006, 10/25/2007, 12/03/2008, 11/19/2009  . Influenza,inj,Quad PF,36+ Mos 11/29/2012  . Pneumococcal Polysaccharide-23 12/22/2002  . Td 06/12/1997, 10/29/2009  . Zoster 02/12/2013    Screening Tests Health Maintenance  Topic Date Due  . Zostavax  10/25/1994  . Colonoscopy  01/13/2012  . Influenza Vaccine  08/12/2013  . Tetanus/tdap  10/30/2019  . Pneumococcal Polysaccharide Vaccine Age 25 And Over  Completed    All answers were reviewed with the patient and necessary referrals were  made:  METHENEY,CATHERINE, MD   09/26/2013   History reviewed: allergies, current medications, past family history, past medical history, past social history, past surgical history and problem list  Review of Systems A comprehensive review of systems was negative.    Objective:     Vision by Snellen chart: Dr. Jodi Mourning for yearly eye exam.   Body mass index is 35.97 kg/(m^2). BP 131/70  Pulse 86  Wt 203 lb (92.08 kg)  BP 131/70  Pulse 86  Wt 203 lb (92.08 kg) General appearance: alert, cooperative and appears stated age Head: Normocephalic, without obvious abnormality, atraumatic Eyes: conj clear, EOMi, PEERLA Ears: normal TM's and external ear canals both ears Nose: Nares normal. Septum midline. Mucosa normal. No drainage or sinus tenderness. Throat: lips, mucosa, and tongue normal; teeth and gums normal Neck: no adenopathy, no carotid bruit, no JVD, supple, symmetrical, trachea midline and thyroid not enlarged, symmetric, no tenderness/mass/nodules Back: symmetric, no curvature. ROM normal. No CVA tenderness. Lungs: clear to auscultation bilaterally Heart: regular rate and rhythm, S1, S2 normal, no murmur, click, rub or gallop Abdomen: soft, non-tender; bowel sounds normal; no masses,  no organomegaly Extremities: extremities normal, atraumatic, no cyanosis or edema Pulses: 2+ and symmetric Skin: Skin color, texture, turgor normal. No rashes or lesions Lymph nodes: Cervical adenopathy: nl and Supraclavicular adenopathy: nl Neurologic: Alert and oriented X 3, normal strength and tone. Normal symmetric reflexes. Normal coordination and gait     Assessment:     Annual Medicare Wellness Exam       Plan:     During the course of the visit the patient was educated and counseled about appropriate screening and preventive services including:    Pneumococcal vaccine   Influenza vaccine  Cut amlodipine in half until f/u with Dr. Stanford Breed. i think could be causing her LE  edema.  Encouraged her to use extra tab of lasix 1-2 times per week to help with the edema.   Right ear cerumen impaction-irrigated and manually disimpacted. Patient tolerated well.   Diet review for nutrition referral? Yes ____  Not Indicated _X_   Patient Instructions (the  written plan) was given to the patient.  Medicare Attestation I have personally reviewed: The patient's medical and social history Their use of alcohol, tobacco or illicit drugs Their current medications and supplements The patient's functional ability including ADLs,fall risks, home safety risks, cognitive, and hearing and visual impairment Diet and physical activities Evidence for depression or mood disorders  The patient's weight, height, BMI, and visual acuity have been recorded in the chart.  I have made referrals, counseling, and provided education to the patient based on review of the above and I have provided the patient with a written personalized care plan for preventive services.     METHENEY,CATHERINE, MD   09/26/2013

## 2013-09-26 NOTE — Patient Instructions (Signed)
Keep up a regular exercise program and make sure you are eating a healthy diet Try to eat 4 servings of dairy a day, or if you are lactose intolerant take a calcium with vitamin D daily.  Your vaccines are up to date.   

## 2013-09-26 NOTE — Assessment & Plan Note (Signed)
Okay to take 2 tabs Lasix in the afternoon and then one if needed. If she's doing this more than once or twice in the week we'll need to recheck kidney function and potassium. We'll also go ahead and decrease the amlodipine to 5 mg and see if the next couple weeks this makes a difference. She has a followup Dr. Kirk Ruths in about 4 weeks.

## 2013-10-04 LAB — BASIC METABOLIC PANEL WITH GFR
BUN: 25 mg/dL — AB (ref 6–23)
CHLORIDE: 100 meq/L (ref 96–112)
CO2: 26 mEq/L (ref 19–32)
CREATININE: 0.94 mg/dL (ref 0.50–1.10)
Calcium: 9.6 mg/dL (ref 8.4–10.5)
GFR, Est African American: 67 mL/min
GFR, Est Non African American: 58 mL/min — ABNORMAL LOW
GLUCOSE: 92 mg/dL (ref 70–99)
POTASSIUM: 4.2 meq/L (ref 3.5–5.3)
Sodium: 140 mEq/L (ref 135–145)

## 2013-10-25 ENCOUNTER — Ambulatory Visit (INDEPENDENT_AMBULATORY_CARE_PROVIDER_SITE_OTHER): Payer: Medicare Other | Admitting: Cardiology

## 2013-10-25 ENCOUNTER — Encounter: Payer: Self-pay | Admitting: Cardiology

## 2013-10-25 VITALS — BP 150/80 | HR 74 | Ht 64.0 in | Wt 206.0 lb

## 2013-10-25 DIAGNOSIS — I480 Paroxysmal atrial fibrillation: Secondary | ICD-10-CM

## 2013-10-25 DIAGNOSIS — I10 Essential (primary) hypertension: Secondary | ICD-10-CM

## 2013-10-25 DIAGNOSIS — R609 Edema, unspecified: Secondary | ICD-10-CM

## 2013-10-25 NOTE — Patient Instructions (Signed)
Your physician recommends that you schedule a follow-up appointment in: 3 MONTHS WITH DR CRENSHAW  STOP AMLODIPINE  TRACK BLOOD PRESSURE

## 2013-10-25 NOTE — Assessment & Plan Note (Signed)
Patient remains in sinus rhythm.Previous episode most likely from pneumonia. She has previously declined Coumadin. Continue aspirin and beta blocker.

## 2013-10-25 NOTE — Assessment & Plan Note (Signed)
Management per primary care. 

## 2013-10-25 NOTE — Assessment & Plan Note (Signed)
Patient's blood pressure is mildly elevated today but she follows this at home and it is typically controlled. She thinks amlodipine may be contributing to her pedal edema. We will discontinue this medication and I have asked her to follow her blood pressure at home. If it runs high we will most likely add an ARB. Continue low-dose Lasix for edema.

## 2013-10-25 NOTE — Progress Notes (Signed)
HPI: FU atrial fibrillation. The patient was admitted in [place] on January 31 of 2012 with pneumonia and atrial fibrillation. Per the discharge summary echocardiogram showed normal LV function; there was moderately severe mitral regurgitation, mild tricuspid regurgitation and mild pulmonary hypertension. Cardiac markers and TSH were normal. Chest CT showed no pulmonary embolus. She was placed on amiodarone and converted to sinus rhythm. It was felt this could be discontinued in approximately 6-8 weeks as her atrial fibrillation may have been related to her pneumonia. Anticoagulation apparently was suggested but the patient declined. Pt had a nuclear study after DC that was negative by report (records not available). Echocardiogram was repeated in September 2014 and showed normal LV function, grade 1 diastolic dysfunction, trace aortic insufficiency, no significant mitral regurgitation and mild to moderate left atrial enlargement. Since I last saw her, She denies chest pain. She has some dyspnea on exertion which is unchanged and chronic by her report. No orthopnea. She has developed pedal edema in the past several months.   Current Outpatient Prescriptions  Medication Sig Dispense Refill  . AMBULATORY NON FORMULARY MEDICATION Knee High Compression Stockings: Please fit to size with pressure of 20-70mmHg.  Wearing during all waking hours unless in water.  Dx: Lower extremity edema.  1 Units  1  . amLODipine (NORVASC) 10 MG tablet TAKE HALF TABLET DAILY      . aspirin EC 81 MG tablet Take 1 tablet (81 mg total) by mouth daily.  150 tablet  2  . Calcium Carbonate-Vitamin D (CALTRATE 600+D) 600-400 MG-UNIT per tablet Take 1 tablet by mouth 2 (two) times daily.       . cyanocobalamin 500 MCG tablet Take 500 mcg by mouth daily.      . DULoxetine (CYMBALTA) 60 MG capsule TAKE (1) CAPSULE DAILY.  30 capsule  3  . furosemide (LASIX) 20 MG tablet Take 1 tablet (20 mg total) by mouth 2 (two) times  daily.  60 tablet  0  . hydroxychloroquine (PLAQUENIL) 200 MG tablet Take by mouth daily.        . metoprolol (LOPRESSOR) 100 MG tablet TAKE 1 TABLET TWO TIMES DAILY.  60 tablet  4  . oxybutynin (DITROPAN) 5 MG tablet TAKE 1 TABLET TWICE A DAY  60 tablet  4  . sucralfate (CARAFATE) 1 G tablet TAKE 1 TABLET TWICE A DAY  60 tablet  3  . [DISCONTINUED] oxybutynin (DITROPAN) 5 MG tablet TAKE 1 TABLET TWICE A DAY  60 tablet  1   No current facility-administered medications for this visit.     Past Medical History  Diagnosis Date  . Hyperlipidemia   . Atrial fibrillation     setting of pneumonia  . Anxiety   . Lupus erythematosus     discoid  . Peripheral neuropathy   . IBS (irritable bowel syndrome)   . Adenocarcinoma     breast, right  . Hemorrhoids   . Renal insufficiency   . Gastric ulcer   . Headache(784.0)     chronic  . Lumbar disc disease   . Anemia     iron deficiency- NOS  . Depression   . Diabetic retinopathy     Dr Jodi Mourning  . DDD (degenerative disc disease), lumbar     Dr Owens Shark  . Morton neuroma     right- Dr Wardell Honour  . PUD (peptic ulcer disease)   . Discoid lupus   . Hypertension     oral meds  Past Surgical History  Procedure Laterality Date  . Abdominal hysterectomy      1 oophorectomy  . Total knee arthroplasty      both- Dr Devona Konig  . Lumbar laminectomy    . Right partial mastectomy w/sentinel lymph node biopsy  11-09  . Cholecystectomy  08/2005    History   Social History  . Marital Status: Widowed    Spouse Name: N/A    Number of Children: 3  . Years of Education: N/A   Occupational History  . Retired    Social History Main Topics  . Smoking status: Never Smoker   . Smokeless tobacco: Never Used  . Alcohol Use: 0.6 oz/week    1 Glasses of wine per week     Comment: occasional  . Drug Use: No  . Sexual Activity: Not on file   Other Topics Concern  . Not on file   Social History Narrative  . No narrative on file    ROS:  fatigue but no fevers or chills, productive cough, hemoptysis, dysphasia, odynophagia, melena, hematochezia, dysuria, hematuria, rash, seizure activity, orthopnea, PND, claudication. Remaining systems are negative.  Physical Exam: Well-developed obese in no acute distress.  Skin is warm and dry.  HEENT is normal.  Neck is supple.  Chest is clear to auscultation with normal expansion.  Cardiovascular exam is regular rate and rhythm.  Abdominal exam nontender or distended. No masses palpated. Extremities show 1+ ankle edema. neuro grossly intact  ECG Sinus rhythm at a rate of 74.Left axis deviation. Incomplete right bundle branch block.

## 2013-10-25 NOTE — Assessment & Plan Note (Signed)
Discontinue amlodipine. Follow blood pressure. Continue low-dose diuretic. Keep feet elevated. I have recommended compression hose.

## 2013-10-26 ENCOUNTER — Other Ambulatory Visit: Payer: Self-pay | Admitting: Family Medicine

## 2013-11-09 ENCOUNTER — Other Ambulatory Visit: Payer: Self-pay | Admitting: Family Medicine

## 2013-11-18 ENCOUNTER — Other Ambulatory Visit: Payer: Self-pay | Admitting: Family Medicine

## 2013-11-30 ENCOUNTER — Ambulatory Visit (INDEPENDENT_AMBULATORY_CARE_PROVIDER_SITE_OTHER): Payer: Medicare Other | Admitting: Family Medicine

## 2013-11-30 ENCOUNTER — Encounter: Payer: Self-pay | Admitting: Family Medicine

## 2013-11-30 VITALS — BP 148/90 | HR 75 | Temp 97.3°F | Ht 64.0 in | Wt 204.0 lb

## 2013-11-30 DIAGNOSIS — I10 Essential (primary) hypertension: Secondary | ICD-10-CM

## 2013-11-30 DIAGNOSIS — R7301 Impaired fasting glucose: Secondary | ICD-10-CM

## 2013-11-30 LAB — POCT GLYCOSYLATED HEMOGLOBIN (HGB A1C): HEMOGLOBIN A1C: 6

## 2013-11-30 MED ORDER — FUROSEMIDE 20 MG PO TABS: 20.0000 mg | ORAL_TABLET | Freq: Two times a day (BID) | ORAL | Status: DC

## 2013-11-30 MED ORDER — LOSARTAN POTASSIUM 25 MG PO TABS: 25.0000 mg | ORAL_TABLET | Freq: Every day | ORAL | Status: DC

## 2013-11-30 NOTE — Assessment & Plan Note (Signed)
Increased mildly.  A1C today is 6.0.  Work on diet and exercise.  F/U in 6 mon

## 2013-11-30 NOTE — Patient Instructions (Signed)
Please go for your labs next week.  You don't have to fast.   Continue to track BPs at home.

## 2013-11-30 NOTE — Progress Notes (Signed)
   Subjective:    Patient ID: [name], female    DOB: Aug 27, 1932, 80 y.o.   MRN: [id number]  HPI Hypertension- Pt denies chest pain, SOB, dizziness, or heart palpitations.  Taking meds as directed w/o problems.  Denies medication side effects.  We decreased her amlodipine about 2 months ago because of pedal edema. She then saw her cardiologist about 4 weeks ago he stopped completely. She's been monitoring her blood pressures at home and brought in her daily readings over the last week. They range from a systolic hundred and 123456 to 163. Diastolic's are primarily in the 70s to 90s.  Follow-up impaired fasting glucose-noted increased thirst or urination. Review of Systems     Objective:   Physical Exam  Constitutional: She is oriented to person, place, and time. She appears well-developed and well-nourished.  HENT:  Head: Normocephalic and atraumatic.  Cardiovascular: Normal rate, regular rhythm and normal heart sounds.   Pulmonary/Chest: Effort normal and breath sounds normal.  Neurological: She is alert and oriented to person, place, and time.  Skin: Skin is warm and dry.  Psychiatric: She has a normal mood and affect. Her behavior is normal.          Assessment & Plan:  HTN- not well controlled.  Discussed adding back ARB based on Dr. Jacalyn Lefevre note.  Will try losartan 25 mg.  Recheck BUN/CR in one week since intolerance to lisinopril as caused sig inc in BUN/Cr.    IFG - up a little today. Discussed diet and exercise.

## 2013-12-28 ENCOUNTER — Other Ambulatory Visit: Payer: Self-pay | Admitting: Family Medicine

## 2014-01-02 ENCOUNTER — Encounter: Payer: Self-pay | Admitting: Family Medicine

## 2014-01-02 ENCOUNTER — Ambulatory Visit (INDEPENDENT_AMBULATORY_CARE_PROVIDER_SITE_OTHER): Payer: Medicare Other | Admitting: Family Medicine

## 2014-01-02 VITALS — BP 118/82 | HR 78 | Ht 64.0 in | Wt 201.0 lb

## 2014-01-02 DIAGNOSIS — I10 Essential (primary) hypertension: Secondary | ICD-10-CM

## 2014-01-02 NOTE — Progress Notes (Signed)
   Subjective:    Patient ID: [name], female    DOB: Dec 19, 1932, 80 y.o.   MRN: [id number]  HPI Hypertension- Pt denies chest pain, SOB, dizziness, or heart palpitations.  Taking meds as directed w/o problems.  Denies medication side effects.  We added losartan 25 mg daily to her regimen. She went for blood work today.  Has been hurting in her shoulder and neck and plans on following back up with her orthopedist. She's hoping to be able to get a shot to get some pain relief. But she do think this may have been contributing to her recently elevated blood pressures. She did bring a log of home blood pressures. Overall they look fantastic except for 2 readings. One of which was in the 150 range and one was in the 170s range. All the other readings were under [id number] systolic.   Review of Systems     Objective:   Physical Exam  Constitutional: She is oriented to person, place, and time. She appears well-developed and well-nourished.  HENT:  Head: Normocephalic and atraumatic.  Cardiovascular: Normal rate, regular rhythm and normal heart sounds.   Pulmonary/Chest: Effort normal and breath sounds normal.  Neurological: She is alert and oriented to person, place, and time.  Skin: Skin is warm and dry.  Psychiatric: She has a normal mood and affect. Her behavior is normal.          Assessment & Plan:  HTN - well controlled with addition of losartan 25mg .  F/u in 3 months.  Any function and potassium. Will call with results once available. Otherwise I think everything looks fantastic. Continue to monitor periodically at home.

## 2014-01-03 LAB — BASIC METABOLIC PANEL WITH GFR
BUN: 25 mg/dL — ABNORMAL HIGH (ref 6–23)
CALCIUM: 9.5 mg/dL (ref 8.4–10.5)
CHLORIDE: 101 meq/L (ref 96–112)
CO2: 31 meq/L (ref 19–32)
Creat: 0.94 mg/dL (ref 0.50–1.10)
GFR, Est African American: 67 mL/min
GFR, Est Non African American: 58 mL/min — ABNORMAL LOW
Glucose, Bld: 92 mg/dL (ref 70–99)
Potassium: 3.8 mEq/L (ref 3.5–5.3)
Sodium: 140 mEq/L (ref 135–145)

## 2014-01-03 NOTE — Progress Notes (Signed)
Quick Note:  All labs are normal. ______ 

## 2014-01-09 ENCOUNTER — Other Ambulatory Visit: Payer: Self-pay | Admitting: Family Medicine

## 2014-01-22 ENCOUNTER — Other Ambulatory Visit: Payer: Self-pay | Admitting: Family Medicine

## 2014-01-24 ENCOUNTER — Encounter: Payer: Self-pay | Admitting: Cardiology

## 2014-01-24 ENCOUNTER — Ambulatory Visit (INDEPENDENT_AMBULATORY_CARE_PROVIDER_SITE_OTHER): Payer: Medicare Other | Admitting: Cardiology

## 2014-01-24 VITALS — BP 126/82 | HR 70 | Ht 64.0 in | Wt 200.0 lb

## 2014-01-24 DIAGNOSIS — I10 Essential (primary) hypertension: Secondary | ICD-10-CM | POA: Diagnosis not present

## 2014-01-24 DIAGNOSIS — I480 Paroxysmal atrial fibrillation: Secondary | ICD-10-CM | POA: Diagnosis not present

## 2014-01-24 NOTE — Progress Notes (Signed)
HPI: FU atrial fibrillation. The patient was admitted in [place] on January 31 of 2012 with pneumonia and atrial fibrillation. Per the discharge summary echocardiogram showed normal LV function; there was moderately severe mitral regurgitation, mild tricuspid regurgitation and mild pulmonary hypertension. Cardiac markers and TSH were normal. Chest CT showed no pulmonary embolus. She was placed on amiodarone and converted to sinus rhythm. It was felt this could be discontinued in approximately 6-8 weeks as her atrial fibrillation may have been related to her pneumonia. Anticoagulation apparently was suggested but the patient declined. Pt had a nuclear study after DC that was negative by report (records not available). Echocardiogram was repeated in September 2014 and showed normal LV function, grade 1 diastolic dysfunction, trace aortic insufficiency, no significant mitral regurgitation and mild to moderate left atrial enlargement. Since I last saw her, she denies dyspnea, chest pain, palpitations or syncope. Her pedal edema has resolved off of Norvasc.  Current Outpatient Prescriptions  Medication Sig Dispense Refill  . AMBULATORY NON FORMULARY MEDICATION Knee High Compression Stockings: Please fit to size with pressure of 20-79mmHg.  Wearing during all waking hours unless in water.  Dx: Lower extremity edema. 1 Units 1  . Calcium Carbonate-Vitamin D (CALTRATE 600+D) 600-400 MG-UNIT per tablet Take 1 tablet by mouth 2 (two) times daily.     . cyanocobalamin 500 MCG tablet Take 500 mcg by mouth daily.    . DULoxetine (CYMBALTA) 60 MG capsule TAKE ONE CAPSULE DAILY 30 capsule 6  . hydroxychloroquine (PLAQUENIL) 200 MG tablet Take by mouth daily.      . Ibuprofen 200 MG CAPS Take by mouth.    . losartan (COZAAR) 25 MG tablet Take 1 tablet (25 mg total) by mouth daily. 30 tablet 6  . metoprolol (LOPRESSOR) 100 MG tablet TAKE ONE TABLET TWICE DAILY 60 tablet 3  . oxybutynin (DITROPAN) 5 MG  tablet TAKE 1 TABLET TWICE A DAY 60 tablet 0  . sucralfate (CARAFATE) 1 G tablet TAKE 1 TABLET TWICE A DAY 60 tablet 0  . [DISCONTINUED] oxybutynin (DITROPAN) 5 MG tablet TAKE 1 TABLET TWICE A DAY 60 tablet 1   No current facility-administered medications for this visit.     Past Medical History  Diagnosis Date  . Hyperlipidemia   . Atrial fibrillation     setting of pneumonia  . Anxiety   . Lupus erythematosus     discoid  . Peripheral neuropathy   . IBS (irritable bowel syndrome)   . Adenocarcinoma     breast, right  . Hemorrhoids   . Renal insufficiency   . Gastric ulcer   . Headache(784.0)     chronic  . Lumbar disc disease   . Anemia     iron deficiency- NOS  . Depression   . Diabetic retinopathy     Dr Jodi Mourning  . DDD (degenerative disc disease), lumbar     Dr Owens Shark  . Morton neuroma     right- Dr Wardell Honour  . PUD (peptic ulcer disease)   . Discoid lupus   . Hypertension     oral meds    Past Surgical History  Procedure Laterality Date  . Abdominal hysterectomy      1 oophorectomy  . Total knee arthroplasty      both- Dr Devona Konig  . Lumbar laminectomy    . Right partial mastectomy w/sentinel lymph node biopsy  11-09  . Cholecystectomy  08/2005    History   Social History  . Marital  Status: Widowed    Spouse Name: N/A    Number of Children: 3  . Years of Education: N/A   Occupational History  . Retired    Social History Main Topics  . Smoking status: Never Smoker   . Smokeless tobacco: Never Used  . Alcohol Use: 0.6 oz/week    1 Glasses of wine per week     Comment: occasional  . Drug Use: No  . Sexual Activity: Not on file   Other Topics Concern  . Not on file   Social History Narrative    ROS: no fevers or chills, productive cough, hemoptysis, dysphasia, odynophagia, melena, hematochezia, dysuria, hematuria, rash, seizure activity, orthopnea, PND, pedal edema, claudication. Remaining systems are negative.  Physical  Exam: Well-developed well-nourished in no acute distress.  Skin is warm and dry.  HEENT is normal.  Neck is supple.  Chest is clear to auscultation with normal expansion.  Cardiovascular exam is regular rate and rhythm. 2/6 systolic ejection murmur  Abdominal exam nontender or distended. No masses palpated. Extremities show no edema. neuro grossly intact

## 2014-01-24 NOTE — Assessment & Plan Note (Signed)
Patient remains in sinus rhythm on exam. Previous episode most likely from pneumonia. She has previously declined Coumadin. Continue aspirin and beta blocker.

## 2014-01-24 NOTE — Patient Instructions (Signed)
Your physician recommends that you schedule a follow-up appointment in: AS NEEDED  

## 2014-01-24 NOTE — Assessment & Plan Note (Signed)
Blood pressure controlled. Continue present medications. 

## 2014-01-24 NOTE — Assessment & Plan Note (Signed)
Management per primary care. 

## 2014-02-12 DIAGNOSIS — Z1231 Encounter for screening mammogram for malignant neoplasm of breast: Secondary | ICD-10-CM | POA: Diagnosis not present

## 2014-02-12 DIAGNOSIS — Z853 Personal history of malignant neoplasm of breast: Secondary | ICD-10-CM | POA: Diagnosis not present

## 2014-02-21 ENCOUNTER — Other Ambulatory Visit: Payer: Self-pay | Admitting: Family Medicine

## 2014-02-22 ENCOUNTER — Other Ambulatory Visit: Payer: Self-pay | Admitting: *Deleted

## 2014-02-22 MED ORDER — OXYBUTYNIN CHLORIDE 5 MG PO TABS: 5.0000 mg | ORAL_TABLET | Freq: Two times a day (BID) | ORAL | Status: DC

## 2014-03-01 ENCOUNTER — Ambulatory Visit (INDEPENDENT_AMBULATORY_CARE_PROVIDER_SITE_OTHER): Payer: Medicare Other | Admitting: Family Medicine

## 2014-03-01 ENCOUNTER — Encounter: Payer: Self-pay | Admitting: Family Medicine

## 2014-03-01 VITALS — BP 155/81 | HR 69 | Wt 200.0 lb

## 2014-03-01 DIAGNOSIS — R296 Repeated falls: Secondary | ICD-10-CM

## 2014-03-01 DIAGNOSIS — R29898 Other symptoms and signs involving the musculoskeletal system: Secondary | ICD-10-CM

## 2014-03-01 NOTE — Progress Notes (Signed)
Subjective:    Patient ID: [name], female    DOB: [birth year], 81 y.o.   MRN: [id number]  HPI Here for frequent falls today.  Says they started in Sept.  No dizziness. Not tripping.  No knee problems.  Felt like this when she started a statin years ago.  Only new medication is losartan and that was started at the end of November. Denies feeling week. No head injury with falls.  Has happened 3 times. The first time was in September when she was trying to get out of a car. She stood up and then turned to use her walker and fell. She doesn't remember feeling dizzy or losing her balance. The second time and was at [place]. Somewhat open the door for her and she stepped back off the curb and fell. The last time she fell was about a week ago. Was getting ice out of the Freezer and then fell.  Called EMS.  BP was high when they got ther.  No LOC. No CP or heart flutter.    Review of Systems  BP 155/81 mmHg  Pulse 69  Wt 200 lb (90.719 kg)  SpO2 95%    Allergies  Allergen Reactions  . Aspirin Other (See Comments)    ulcer  . Gabapentin Hives and Itching  . Oxycodone Other (See Comments)    Pt unsure  . Simvastatin Other (See Comments)    Lost the use of her legs  . Sulfonamide Derivatives Hives and Itching  . Amlodipine Other (See Comments)    Pedal edema  . Atorvastatin   . Azithromycin   . Beta Adrenergic Blockers   . Captopril   . Codeine   . Fluvastatin Sodium   . Hydrochlorothiazide Other (See Comments)    INcreaseed BUN/Creatinine  . Latex   . Lisinopril Other (See Comments)    Increased BUN/CR  . Lovastatin   . Macrolides And Ketolides   . Rosiglitazone     Past Medical History  Diagnosis Date  . Hyperlipidemia   . Atrial fibrillation     setting of pneumonia  . Anxiety   . Lupus erythematosus     discoid  . Peripheral neuropathy   . IBS (irritable bowel syndrome)   . Adenocarcinoma     breast, right  . Hemorrhoids   . Renal insufficiency   .  Gastric ulcer   . Headache(784.0)     chronic  . Lumbar disc disease   . Anemia     iron deficiency- NOS  . Depression   . Diabetic retinopathy     Dr Jodi Mourning  . DDD (degenerative disc disease), lumbar     Dr Owens Shark  . Morton neuroma     right- Dr Wardell Honour  . PUD (peptic ulcer disease)   . Discoid lupus   . Hypertension     oral meds    Past Surgical History  Procedure Laterality Date  . Abdominal hysterectomy      1 oophorectomy  . Total knee arthroplasty      both- Dr Devona Konig  . Lumbar laminectomy    . Right partial mastectomy w/sentinel lymph node biopsy  11-09  . Cholecystectomy  08/2005    History   Social History  . Marital Status: Widowed    Spouse Name: N/A  . Number of Children: 3  . Years of Education: N/A   Occupational History  . Retired    Social History Main Topics  . Smoking status: Never Smoker   .  Smokeless tobacco: Never Used  . Alcohol Use: 0.6 oz/week    1 Glasses of wine per week     Comment: occasional  . Drug Use: No  . Sexual Activity: Not on file   Other Topics Concern  . Not on file   Social History Narrative    Family History  Problem Relation Age of Onset  . Diabetes Mother   . Angina Mother   . COPD Father     colon  . Diabetes Father   . Stroke Father   . Hyperlipidemia Father   . Colon cancer Father     Outpatient Encounter Prescriptions as of 03/01/2014  Medication Sig  . AMBULATORY NON FORMULARY MEDICATION Knee High Compression Stockings: Please fit to size with pressure of 20-35mmHg.  Wearing during all waking hours unless in water.  Dx: Lower extremity edema.  . Calcium Carbonate-Vitamin D (CALTRATE 600+D) 600-400 MG-UNIT per tablet Take 1 tablet by mouth 2 (two) times daily.   . cyanocobalamin 500 MCG tablet Take 1,000 mcg by mouth daily.   . DULoxetine (CYMBALTA) 60 MG capsule TAKE ONE CAPSULE DAILY  . hydroxychloroquine (PLAQUENIL) 200 MG tablet Take by mouth daily.    . Ibuprofen 200 MG CAPS Take by mouth.   . losartan (COZAAR) 25 MG tablet Take 1 tablet (25 mg total) by mouth daily.  . metoprolol (LOPRESSOR) 100 MG tablet TAKE ONE TABLET TWICE DAILY  . oxybutynin (DITROPAN) 5 MG tablet Take 1 tablet (5 mg total) by mouth 2 (two) times daily.  . sucralfate (CARAFATE) 1 G tablet TAKE 1 TABLET TWICE A DAY          Objective:   Physical Exam  Constitutional: She is oriented to person, place, and time. She appears well-developed and well-nourished.  HENT:  Head: Normocephalic and atraumatic.  Right Ear: External ear normal.  Left Ear: External ear normal.  Nose: Nose normal.  Mouth/Throat: Oropharynx is clear and moist.  TMs and canals are clear.   Eyes: Conjunctivae and EOM are normal. Pupils are equal, round, and reactive to light.  Neck: Neck supple. No thyromegaly present.  Cardiovascular: Normal rate, regular rhythm and normal heart sounds.   Pulmonary/Chest: Effort normal and breath sounds normal. She has no wheezes.  Musculoskeletal:  Hip flexion is 4 out of 5 bilaterally. Knee flexion extension is 5 out of 5, ankle flexion and extension on the right is 5 out of 5. Ankle flexion is 4 out of 5 on the left and ankle dorsiflexion is 3 out of 5 on the left.  Lymphadenopathy:    She has no cervical adenopathy.  Neurological: She is alert and oriented to person, place, and time.  Skin: Skin is warm and dry.  Psychiatric: She has a normal mood and affect. Her behavior is normal.          Assessment & Plan:  Frequent falls - no specific etiology found... She denies any lightheadedness or dizziness suspect hypertension or vertigo or less likely. EMS did come after her fall recently and her blood pressure was actually elevated that time. I suspect is more related to lower extremity weakness. She denies any chest pain or palpitations around the time this occurs. Recommend home health referral for fall risk reduction. They can do a home evaluation and make recommendations to make her home  more safe as well as do some physical therapy to help with lower Schimke strengthening. She is quite weak with the hips but has relatively good strength at  the knees and ankles. She does use a walker and bends over quite far to push her walker which is relatively unsafe. They can help educate her on safe ways of using assistive devices.    Lower extremity weakness bilaterally-see note above. Will refer to PT at home

## 2014-03-12 ENCOUNTER — Other Ambulatory Visit: Payer: Self-pay | Admitting: Family Medicine

## 2014-03-19 DIAGNOSIS — I158 Other secondary hypertension: Secondary | ICD-10-CM | POA: Diagnosis not present

## 2014-03-19 DIAGNOSIS — R2689 Other abnormalities of gait and mobility: Secondary | ICD-10-CM | POA: Diagnosis not present

## 2014-03-19 DIAGNOSIS — R29898 Other symptoms and signs involving the musculoskeletal system: Secondary | ICD-10-CM | POA: Diagnosis not present

## 2014-03-19 DIAGNOSIS — L930 Discoid lupus erythematosus: Secondary | ICD-10-CM | POA: Diagnosis not present

## 2014-03-19 DIAGNOSIS — R296 Repeated falls: Secondary | ICD-10-CM | POA: Diagnosis not present

## 2014-03-21 ENCOUNTER — Other Ambulatory Visit: Payer: Self-pay | Admitting: Family Medicine

## 2014-03-21 DIAGNOSIS — R2689 Other abnormalities of gait and mobility: Secondary | ICD-10-CM | POA: Diagnosis not present

## 2014-03-21 DIAGNOSIS — R29898 Other symptoms and signs involving the musculoskeletal system: Secondary | ICD-10-CM | POA: Diagnosis not present

## 2014-03-21 DIAGNOSIS — I158 Other secondary hypertension: Secondary | ICD-10-CM | POA: Diagnosis not present

## 2014-03-21 DIAGNOSIS — R296 Repeated falls: Secondary | ICD-10-CM | POA: Diagnosis not present

## 2014-03-21 DIAGNOSIS — L930 Discoid lupus erythematosus: Secondary | ICD-10-CM | POA: Diagnosis not present

## 2014-03-22 DIAGNOSIS — L930 Discoid lupus erythematosus: Secondary | ICD-10-CM | POA: Diagnosis not present

## 2014-03-22 DIAGNOSIS — R296 Repeated falls: Secondary | ICD-10-CM | POA: Diagnosis not present

## 2014-03-22 DIAGNOSIS — R29898 Other symptoms and signs involving the musculoskeletal system: Secondary | ICD-10-CM | POA: Diagnosis not present

## 2014-03-22 DIAGNOSIS — R2689 Other abnormalities of gait and mobility: Secondary | ICD-10-CM | POA: Diagnosis not present

## 2014-03-22 DIAGNOSIS — I158 Other secondary hypertension: Secondary | ICD-10-CM | POA: Diagnosis not present

## 2014-03-24 DIAGNOSIS — I158 Other secondary hypertension: Secondary | ICD-10-CM | POA: Diagnosis not present

## 2014-03-24 DIAGNOSIS — L930 Discoid lupus erythematosus: Secondary | ICD-10-CM | POA: Diagnosis not present

## 2014-03-24 DIAGNOSIS — R29898 Other symptoms and signs involving the musculoskeletal system: Secondary | ICD-10-CM | POA: Diagnosis not present

## 2014-03-24 DIAGNOSIS — R296 Repeated falls: Secondary | ICD-10-CM | POA: Diagnosis not present

## 2014-03-24 DIAGNOSIS — R2689 Other abnormalities of gait and mobility: Secondary | ICD-10-CM | POA: Diagnosis not present

## 2014-03-27 ENCOUNTER — Encounter: Payer: Self-pay | Admitting: Family Medicine

## 2014-03-27 ENCOUNTER — Ambulatory Visit (INDEPENDENT_AMBULATORY_CARE_PROVIDER_SITE_OTHER): Payer: Medicare Other | Admitting: Family Medicine

## 2014-03-27 VITALS — BP 138/86 | HR 72 | Wt 198.0 lb

## 2014-03-27 DIAGNOSIS — M25552 Pain in left hip: Secondary | ICD-10-CM

## 2014-03-27 DIAGNOSIS — R29898 Other symptoms and signs involving the musculoskeletal system: Secondary | ICD-10-CM | POA: Diagnosis not present

## 2014-03-27 DIAGNOSIS — I10 Essential (primary) hypertension: Secondary | ICD-10-CM | POA: Diagnosis not present

## 2014-03-27 DIAGNOSIS — R296 Repeated falls: Secondary | ICD-10-CM | POA: Diagnosis not present

## 2014-03-27 DIAGNOSIS — L930 Discoid lupus erythematosus: Secondary | ICD-10-CM | POA: Diagnosis not present

## 2014-03-27 DIAGNOSIS — R7301 Impaired fasting glucose: Secondary | ICD-10-CM | POA: Diagnosis not present

## 2014-03-27 DIAGNOSIS — I158 Other secondary hypertension: Secondary | ICD-10-CM | POA: Diagnosis not present

## 2014-03-27 DIAGNOSIS — R2689 Other abnormalities of gait and mobility: Secondary | ICD-10-CM | POA: Diagnosis not present

## 2014-03-27 MED ORDER — AMBULATORY NON FORMULARY MEDICATION: Status: DC

## 2014-03-27 NOTE — Progress Notes (Signed)
   Subjective:    Patient ID: [name], female    DOB: 1932/04/13, 81 y.o.   MRN: [id number]  HPI Hypertension- Pt denies chest pain, SOB, dizziness, or heart palpitations.  Taking meds as directed w/o problems.  Denies medication side effects.    IFG - No inc thirst or urination.  She's lost a couple more pounds purposefully since I last saw her.  She would like a new rx for a wheelchair.  The brakes aren't working on her old one. They are missing.  Says the PT has worn her out. She feels like her left hip is some worse. Says felt something pull while she was doing her therapy.  Thinks may have injured it. She does feel like she has noticed some improvement of strength.    Review of Systems     Objective:   Physical Exam  Constitutional: She is oriented to person, place, and time. She appears well-developed and well-nourished.  HENT:  Head: Normocephalic and atraumatic.  Cardiovascular: Normal rate, regular rhythm and normal heart sounds.   Pulmonary/Chest: Effort normal and breath sounds normal.  Neurological: She is alert and oriented to person, place, and time.  Skin: Skin is warm and dry.  Psychiatric: She has a normal mood and affect. Her behavior is normal.          Assessment & Plan:  HTN - well controlled. Continue current regimen. Due for CMP. Follow-up in 6 months.  IFG - Well controlled. A1C is 5.8 today, which is improved from previous. Follow-up in 6 months. Next  Lower extremity weakness-perception given for new wheelchair. Next  Left hip pain-she thinks she pulled a muscle during PT about a week ago. I encouraged her to give it a couple weeks. If she feels like it's not healing or suddenly feels worse then encouraged her to give [place] a call back.Marland Kitchen

## 2014-03-28 DIAGNOSIS — I158 Other secondary hypertension: Secondary | ICD-10-CM | POA: Diagnosis not present

## 2014-03-28 DIAGNOSIS — R29898 Other symptoms and signs involving the musculoskeletal system: Secondary | ICD-10-CM | POA: Diagnosis not present

## 2014-03-28 DIAGNOSIS — R296 Repeated falls: Secondary | ICD-10-CM | POA: Diagnosis not present

## 2014-03-28 DIAGNOSIS — L930 Discoid lupus erythematosus: Secondary | ICD-10-CM | POA: Diagnosis not present

## 2014-03-28 DIAGNOSIS — R2689 Other abnormalities of gait and mobility: Secondary | ICD-10-CM | POA: Diagnosis not present

## 2014-03-30 DIAGNOSIS — I158 Other secondary hypertension: Secondary | ICD-10-CM | POA: Diagnosis not present

## 2014-03-30 DIAGNOSIS — R2689 Other abnormalities of gait and mobility: Secondary | ICD-10-CM | POA: Diagnosis not present

## 2014-03-30 DIAGNOSIS — L930 Discoid lupus erythematosus: Secondary | ICD-10-CM | POA: Diagnosis not present

## 2014-03-30 DIAGNOSIS — R29898 Other symptoms and signs involving the musculoskeletal system: Secondary | ICD-10-CM | POA: Diagnosis not present

## 2014-03-30 DIAGNOSIS — R296 Repeated falls: Secondary | ICD-10-CM | POA: Diagnosis not present

## 2014-03-31 DIAGNOSIS — L930 Discoid lupus erythematosus: Secondary | ICD-10-CM | POA: Diagnosis not present

## 2014-03-31 DIAGNOSIS — R296 Repeated falls: Secondary | ICD-10-CM | POA: Diagnosis not present

## 2014-03-31 DIAGNOSIS — R2689 Other abnormalities of gait and mobility: Secondary | ICD-10-CM | POA: Diagnosis not present

## 2014-03-31 DIAGNOSIS — I158 Other secondary hypertension: Secondary | ICD-10-CM | POA: Diagnosis not present

## 2014-03-31 DIAGNOSIS — R29898 Other symptoms and signs involving the musculoskeletal system: Secondary | ICD-10-CM | POA: Diagnosis not present

## 2014-04-03 DIAGNOSIS — I158 Other secondary hypertension: Secondary | ICD-10-CM | POA: Diagnosis not present

## 2014-04-03 DIAGNOSIS — R296 Repeated falls: Secondary | ICD-10-CM | POA: Diagnosis not present

## 2014-04-03 DIAGNOSIS — R29898 Other symptoms and signs involving the musculoskeletal system: Secondary | ICD-10-CM | POA: Diagnosis not present

## 2014-04-03 DIAGNOSIS — R2689 Other abnormalities of gait and mobility: Secondary | ICD-10-CM | POA: Diagnosis not present

## 2014-04-03 DIAGNOSIS — L930 Discoid lupus erythematosus: Secondary | ICD-10-CM | POA: Diagnosis not present

## 2014-04-06 DIAGNOSIS — R2689 Other abnormalities of gait and mobility: Secondary | ICD-10-CM | POA: Diagnosis not present

## 2014-04-06 DIAGNOSIS — R296 Repeated falls: Secondary | ICD-10-CM | POA: Diagnosis not present

## 2014-04-06 DIAGNOSIS — I158 Other secondary hypertension: Secondary | ICD-10-CM | POA: Diagnosis not present

## 2014-04-06 DIAGNOSIS — L930 Discoid lupus erythematosus: Secondary | ICD-10-CM | POA: Diagnosis not present

## 2014-04-06 DIAGNOSIS — R29898 Other symptoms and signs involving the musculoskeletal system: Secondary | ICD-10-CM | POA: Diagnosis not present

## 2014-04-07 DIAGNOSIS — R296 Repeated falls: Secondary | ICD-10-CM | POA: Diagnosis not present

## 2014-04-07 DIAGNOSIS — I158 Other secondary hypertension: Secondary | ICD-10-CM | POA: Diagnosis not present

## 2014-04-07 DIAGNOSIS — L930 Discoid lupus erythematosus: Secondary | ICD-10-CM | POA: Diagnosis not present

## 2014-04-07 DIAGNOSIS — R29898 Other symptoms and signs involving the musculoskeletal system: Secondary | ICD-10-CM | POA: Diagnosis not present

## 2014-04-07 DIAGNOSIS — R2689 Other abnormalities of gait and mobility: Secondary | ICD-10-CM | POA: Diagnosis not present

## 2014-04-10 DIAGNOSIS — R29898 Other symptoms and signs involving the musculoskeletal system: Secondary | ICD-10-CM | POA: Diagnosis not present

## 2014-04-10 DIAGNOSIS — R2689 Other abnormalities of gait and mobility: Secondary | ICD-10-CM | POA: Diagnosis not present

## 2014-04-10 DIAGNOSIS — R296 Repeated falls: Secondary | ICD-10-CM | POA: Diagnosis not present

## 2014-04-10 DIAGNOSIS — L930 Discoid lupus erythematosus: Secondary | ICD-10-CM | POA: Diagnosis not present

## 2014-04-10 DIAGNOSIS — I158 Other secondary hypertension: Secondary | ICD-10-CM | POA: Diagnosis not present

## 2014-04-13 ENCOUNTER — Other Ambulatory Visit: Payer: Self-pay | Admitting: Family Medicine

## 2014-04-14 DIAGNOSIS — R2689 Other abnormalities of gait and mobility: Secondary | ICD-10-CM | POA: Diagnosis not present

## 2014-04-14 DIAGNOSIS — R29898 Other symptoms and signs involving the musculoskeletal system: Secondary | ICD-10-CM | POA: Diagnosis not present

## 2014-04-14 DIAGNOSIS — I158 Other secondary hypertension: Secondary | ICD-10-CM | POA: Diagnosis not present

## 2014-04-14 DIAGNOSIS — L930 Discoid lupus erythematosus: Secondary | ICD-10-CM | POA: Diagnosis not present

## 2014-04-14 DIAGNOSIS — R296 Repeated falls: Secondary | ICD-10-CM | POA: Diagnosis not present

## 2014-04-17 DIAGNOSIS — R2689 Other abnormalities of gait and mobility: Secondary | ICD-10-CM | POA: Diagnosis not present

## 2014-04-17 DIAGNOSIS — R29898 Other symptoms and signs involving the musculoskeletal system: Secondary | ICD-10-CM | POA: Diagnosis not present

## 2014-04-17 DIAGNOSIS — I158 Other secondary hypertension: Secondary | ICD-10-CM | POA: Diagnosis not present

## 2014-04-17 DIAGNOSIS — R296 Repeated falls: Secondary | ICD-10-CM | POA: Diagnosis not present

## 2014-04-17 DIAGNOSIS — L930 Discoid lupus erythematosus: Secondary | ICD-10-CM | POA: Diagnosis not present

## 2014-04-20 DIAGNOSIS — R2689 Other abnormalities of gait and mobility: Secondary | ICD-10-CM | POA: Diagnosis not present

## 2014-04-20 DIAGNOSIS — R29898 Other symptoms and signs involving the musculoskeletal system: Secondary | ICD-10-CM | POA: Diagnosis not present

## 2014-04-20 DIAGNOSIS — I158 Other secondary hypertension: Secondary | ICD-10-CM | POA: Diagnosis not present

## 2014-04-20 DIAGNOSIS — R296 Repeated falls: Secondary | ICD-10-CM | POA: Diagnosis not present

## 2014-04-20 DIAGNOSIS — L930 Discoid lupus erythematosus: Secondary | ICD-10-CM | POA: Diagnosis not present

## 2014-04-21 DIAGNOSIS — R29898 Other symptoms and signs involving the musculoskeletal system: Secondary | ICD-10-CM | POA: Diagnosis not present

## 2014-04-21 DIAGNOSIS — L930 Discoid lupus erythematosus: Secondary | ICD-10-CM | POA: Diagnosis not present

## 2014-04-21 DIAGNOSIS — I158 Other secondary hypertension: Secondary | ICD-10-CM | POA: Diagnosis not present

## 2014-04-21 DIAGNOSIS — R2689 Other abnormalities of gait and mobility: Secondary | ICD-10-CM | POA: Diagnosis not present

## 2014-04-21 DIAGNOSIS — R296 Repeated falls: Secondary | ICD-10-CM | POA: Diagnosis not present

## 2014-04-24 ENCOUNTER — Other Ambulatory Visit: Payer: Self-pay | Admitting: Family Medicine

## 2014-04-25 DIAGNOSIS — L930 Discoid lupus erythematosus: Secondary | ICD-10-CM | POA: Diagnosis not present

## 2014-04-25 DIAGNOSIS — I158 Other secondary hypertension: Secondary | ICD-10-CM | POA: Diagnosis not present

## 2014-04-25 DIAGNOSIS — R2689 Other abnormalities of gait and mobility: Secondary | ICD-10-CM | POA: Diagnosis not present

## 2014-04-25 DIAGNOSIS — R296 Repeated falls: Secondary | ICD-10-CM | POA: Diagnosis not present

## 2014-04-25 DIAGNOSIS — R29898 Other symptoms and signs involving the musculoskeletal system: Secondary | ICD-10-CM | POA: Diagnosis not present

## 2014-04-27 DIAGNOSIS — L930 Discoid lupus erythematosus: Secondary | ICD-10-CM | POA: Diagnosis not present

## 2014-04-27 DIAGNOSIS — R296 Repeated falls: Secondary | ICD-10-CM | POA: Diagnosis not present

## 2014-04-27 DIAGNOSIS — R2689 Other abnormalities of gait and mobility: Secondary | ICD-10-CM | POA: Diagnosis not present

## 2014-04-27 DIAGNOSIS — R29898 Other symptoms and signs involving the musculoskeletal system: Secondary | ICD-10-CM | POA: Diagnosis not present

## 2014-04-27 DIAGNOSIS — I158 Other secondary hypertension: Secondary | ICD-10-CM | POA: Diagnosis not present

## 2014-04-28 DIAGNOSIS — L930 Discoid lupus erythematosus: Secondary | ICD-10-CM | POA: Diagnosis not present

## 2014-04-28 DIAGNOSIS — R2689 Other abnormalities of gait and mobility: Secondary | ICD-10-CM | POA: Diagnosis not present

## 2014-04-28 DIAGNOSIS — I158 Other secondary hypertension: Secondary | ICD-10-CM | POA: Diagnosis not present

## 2014-04-28 DIAGNOSIS — R29898 Other symptoms and signs involving the musculoskeletal system: Secondary | ICD-10-CM | POA: Diagnosis not present

## 2014-04-28 DIAGNOSIS — R296 Repeated falls: Secondary | ICD-10-CM | POA: Diagnosis not present

## 2014-05-02 DIAGNOSIS — L930 Discoid lupus erythematosus: Secondary | ICD-10-CM | POA: Diagnosis not present

## 2014-05-02 DIAGNOSIS — I158 Other secondary hypertension: Secondary | ICD-10-CM | POA: Diagnosis not present

## 2014-05-02 DIAGNOSIS — R2689 Other abnormalities of gait and mobility: Secondary | ICD-10-CM | POA: Diagnosis not present

## 2014-05-02 DIAGNOSIS — R296 Repeated falls: Secondary | ICD-10-CM | POA: Diagnosis not present

## 2014-05-02 DIAGNOSIS — R29898 Other symptoms and signs involving the musculoskeletal system: Secondary | ICD-10-CM | POA: Diagnosis not present

## 2014-05-12 ENCOUNTER — Other Ambulatory Visit: Payer: Self-pay | Admitting: Family Medicine

## 2014-06-26 ENCOUNTER — Other Ambulatory Visit: Payer: Self-pay | Admitting: Family Medicine

## 2014-07-13 ENCOUNTER — Other Ambulatory Visit: Payer: Self-pay | Admitting: Family Medicine

## 2014-08-03 ENCOUNTER — Other Ambulatory Visit: Payer: Self-pay | Admitting: Family Medicine

## 2014-08-06 ENCOUNTER — Ambulatory Visit (INDEPENDENT_AMBULATORY_CARE_PROVIDER_SITE_OTHER): Payer: Medicare Other | Admitting: Family Medicine

## 2014-08-06 ENCOUNTER — Encounter: Payer: Self-pay | Admitting: Family Medicine

## 2014-08-06 VITALS — BP 136/86 | HR 75 | Ht 64.0 in | Wt 194.0 lb

## 2014-08-06 DIAGNOSIS — I10 Essential (primary) hypertension: Secondary | ICD-10-CM | POA: Diagnosis not present

## 2014-08-06 DIAGNOSIS — R7301 Impaired fasting glucose: Secondary | ICD-10-CM

## 2014-08-06 DIAGNOSIS — R067 Sneezing: Secondary | ICD-10-CM

## 2014-08-06 DIAGNOSIS — R51 Headache: Secondary | ICD-10-CM

## 2014-08-06 DIAGNOSIS — L930 Discoid lupus erythematosus: Secondary | ICD-10-CM

## 2014-08-06 DIAGNOSIS — R233 Spontaneous ecchymoses: Secondary | ICD-10-CM

## 2014-08-06 DIAGNOSIS — R519 Headache, unspecified: Secondary | ICD-10-CM

## 2014-08-06 NOTE — Patient Instructions (Addendum)
f Flonase - 1 spray in each nostril once a day for the sneezing and headache Call if bloods pressure is still running high over the next 2 weeks.

## 2014-08-06 NOTE — Progress Notes (Signed)
   Subjective:    Patient ID: ABI BAAS, female    DOB: 01-Jan-1933, 81 y.o.   MRN: [id number]  HPI Follow-up hypertension-she says her blood pressures at home is actually been running higher for last week or so. Systolics have been in the 150 range. She's currently on losartan 25 mg and metoprolol. Has been stressed bc her car didn't pass inspection.  She has lost aobut 4 lbs since last here in [place].    She is also having some pain above her left eye. Says feels like this when she breaks out with hre lupus.  + sneezing.  No congestion.  No fever r chills. Has eye exam next thursdays for her Plaquenil.    She's currently on Plaquenil for her lupus. She sections schedule her eye exam next week. She has a history of discoid lupus.  Impaired fasting glucose-no change in thirst or urination.  Review of Systems     Objective:   Physical Exam  Constitutional: She is oriented to person, place, and time. She appears well-developed and well-nourished.  HENT:  Head: Normocephalic and atraumatic.  Cardiovascular: Normal rate, regular rhythm and normal heart sounds.   No carotid bruit  Pulmonary/Chest: Effort normal and breath sounds normal.  Musculoskeletal: She exhibits no edema.  Neurological: She is alert and oriented to person, place, and time.  Skin: Skin is warm and dry. No rash noted.  No rash on face. Bruises on arms, legs and right side of neck.   Psychiatric: She has a normal mood and affect. Her behavior is normal.          Assessment & Plan:  HTN - BP up a little compared to previous.  She will monitor at home for hte next 2 weeks. If still high will increase her losartan.  Headaches- discussed dx. Has eye appt coming up next week. Recommend trial of flonase for the HA and sneezing. Maybe allergic rhinitis.    Impaired fasting glucose-we'll check A1c.

## 2014-08-07 ENCOUNTER — Other Ambulatory Visit: Payer: Self-pay | Admitting: *Deleted

## 2014-08-07 DIAGNOSIS — R7989 Other specified abnormal findings of blood chemistry: Secondary | ICD-10-CM

## 2014-08-07 LAB — COMPLETE METABOLIC PANEL WITH GFR
ALT: 14 U/L (ref 6–29)
AST: 20 U/L (ref 10–35)
Albumin: 4.2 g/dL (ref 3.6–5.1)
Alkaline Phosphatase: 65 U/L (ref 33–130)
BILIRUBIN TOTAL: 0.6 mg/dL (ref 0.2–1.2)
BUN: 26 mg/dL — AB (ref 7–25)
CALCIUM: 10.2 mg/dL (ref 8.6–10.4)
CO2: 29 mmol/L (ref 20–31)
CREATININE: 1.16 mg/dL — AB (ref 0.60–0.93)
Chloride: 101 mmol/L (ref 98–110)
GFR, EST NON AFRICAN AMERICAN: 45 mL/min — AB (ref >=60)
GFR, Est African American: 52 mL/min — ABNORMAL LOW (ref >=60)
Glucose, Bld: 105 mg/dL — ABNORMAL HIGH (ref 65–99)
Potassium: 4.1 mmol/L (ref 3.5–5.3)
Sodium: 142 mmol/L (ref 135–146)
Total Protein: 7 g/dL (ref 6.1–8.1)

## 2014-08-07 LAB — HEMOGLOBIN A1C
Hgb A1c MFr Bld: 5.9 % — ABNORMAL HIGH (ref <5.7)
Mean Plasma Glucose: 123 mg/dL — ABNORMAL HIGH (ref <117)

## 2014-08-15 ENCOUNTER — Other Ambulatory Visit: Payer: Self-pay | Admitting: Family Medicine

## 2014-09-18 ENCOUNTER — Telehealth: Payer: Self-pay | Admitting: *Deleted

## 2014-09-18 DIAGNOSIS — I10 Essential (primary) hypertension: Secondary | ICD-10-CM

## 2014-09-18 MED ORDER — LOSARTAN POTASSIUM 50 MG PO TABS: 50.0000 mg | ORAL_TABLET | Freq: Every day | ORAL | Status: DC

## 2014-09-18 NOTE — Telephone Encounter (Signed)
Pt called and stated that her bp's are running high. She was told to call back to let her know about this and that Dr. Madilyn Fireman would call in losartan for her .[name]

## 2014-09-18 NOTE — Telephone Encounter (Signed)
[name] [name]  

## 2014-09-18 NOTE — Telephone Encounter (Signed)
New rx sent to pharmacy for 50mg  losartan. Can take 2 of what she has if still has plenty. Then follow BP for 2 weeks and call back. Will need a BMp after one week on the new dose.

## 2014-09-24 DIAGNOSIS — E119 Type 2 diabetes mellitus without complications: Secondary | ICD-10-CM | POA: Diagnosis not present

## 2014-09-24 DIAGNOSIS — H5203 Hypermetropia, bilateral: Secondary | ICD-10-CM | POA: Diagnosis not present

## 2014-09-24 LAB — HM DIABETES EYE EXAM

## 2014-09-27 ENCOUNTER — Ambulatory Visit (INDEPENDENT_AMBULATORY_CARE_PROVIDER_SITE_OTHER): Payer: Medicare Other | Admitting: Family Medicine

## 2014-09-27 ENCOUNTER — Encounter: Payer: Self-pay | Admitting: Family Medicine

## 2014-09-27 VITALS — BP 130/78 | HR 65 | Temp 98.2°F | Ht 64.0 in | Wt 200.0 lb

## 2014-09-27 DIAGNOSIS — F325 Major depressive disorder, single episode, in full remission: Secondary | ICD-10-CM

## 2014-09-27 DIAGNOSIS — L930 Discoid lupus erythematosus: Secondary | ICD-10-CM

## 2014-09-27 DIAGNOSIS — Z23 Encounter for immunization: Secondary | ICD-10-CM | POA: Diagnosis not present

## 2014-09-27 DIAGNOSIS — M2550 Pain in unspecified joint: Secondary | ICD-10-CM

## 2014-09-27 DIAGNOSIS — N183 Chronic kidney disease, stage 3 unspecified: Secondary | ICD-10-CM

## 2014-09-27 DIAGNOSIS — I10 Essential (primary) hypertension: Secondary | ICD-10-CM

## 2014-09-27 DIAGNOSIS — F324 Major depressive disorder, single episode, in partial remission: Secondary | ICD-10-CM

## 2014-09-27 NOTE — Progress Notes (Signed)
   Subjective:    Patient ID: [name], female    DOB: [birth year], 81 y.o.   MRN: [id number]  HPI Hypertension- Pt denies chest pain, SOB, dizziness, or heart palpitations.  Taking meds as directed w/o problems.  Denies medication side effects.  She is on metoprolol and losartan.  Increased renal function - CKD 3.  Due to recheck.  Her BUN was up last time.   SLE - her rheumatologist left the area and has enough refills for 3 months. She would prefer to HP. She is on Plaquenil  Depression-doing well on Cymbalta. Continue current regimen.  Review of Systems     Objective:   Physical Exam  Constitutional: She is oriented to person, place, and time. She appears well-developed and well-nourished.  HENT:  Head: Normocephalic and atraumatic.  Cardiovascular: Normal rate, regular rhythm and normal heart sounds.   Pulmonary/Chest: Effort normal and breath sounds normal.  Neurological: She is alert and oriented to person, place, and time.  Skin: Skin is warm and dry.  Psychiatric: She has a normal mood and affect. Her behavior is normal.          Assessment & Plan:  HTN - well controlled on current regimen continue current regimen. Due for BMP. Follow-up in 6 months.  CKD 3- due to recheck kidney function today. We'll follow every 6 months and sooner if the levels increase.  SLE- will need new Rhuem referral.   Depression-stable. Continue Cymbalta.

## 2014-10-01 DIAGNOSIS — R7989 Other specified abnormal findings of blood chemistry: Secondary | ICD-10-CM | POA: Diagnosis not present

## 2014-10-01 DIAGNOSIS — R748 Abnormal levels of other serum enzymes: Secondary | ICD-10-CM | POA: Diagnosis not present

## 2014-10-02 LAB — BASIC METABOLIC PANEL WITH GFR
BUN: 26 mg/dL — AB (ref 7–25)
CALCIUM: 9.9 mg/dL (ref 8.6–10.4)
CHLORIDE: 99 mmol/L (ref 98–110)
CO2: 29 mmol/L (ref 20–31)
Creat: 0.96 mg/dL — ABNORMAL HIGH (ref 0.60–0.93)
GFR, Est African American: 65 mL/min (ref >=60)
GFR, Est Non African American: 56 mL/min — ABNORMAL LOW (ref >=60)
GLUCOSE: 116 mg/dL — AB (ref 65–99)
Potassium: 4.3 mmol/L (ref 3.5–5.3)
Sodium: 141 mmol/L (ref 135–146)

## 2014-10-05 DIAGNOSIS — C50911 Malignant neoplasm of unspecified site of right female breast: Secondary | ICD-10-CM | POA: Diagnosis not present

## 2014-10-10 ENCOUNTER — Other Ambulatory Visit: Payer: Self-pay | Admitting: Family Medicine

## 2014-10-30 DIAGNOSIS — M810 Age-related osteoporosis without current pathological fracture: Secondary | ICD-10-CM | POA: Diagnosis not present

## 2014-10-30 DIAGNOSIS — M159 Polyosteoarthritis, unspecified: Secondary | ICD-10-CM | POA: Diagnosis not present

## 2014-10-30 DIAGNOSIS — L932 Other local lupus erythematosus: Secondary | ICD-10-CM | POA: Diagnosis not present

## 2014-11-23 ENCOUNTER — Other Ambulatory Visit: Payer: Self-pay

## 2014-11-23 MED ORDER — DULOXETINE HCL 60 MG PO CPEP: 60.0000 mg | ORAL_CAPSULE | Freq: Every day | ORAL | Status: DC

## 2014-12-14 ENCOUNTER — Other Ambulatory Visit: Payer: Self-pay | Admitting: Family Medicine

## 2015-01-09 ENCOUNTER — Other Ambulatory Visit: Payer: Self-pay | Admitting: Family Medicine

## 2015-01-10 DIAGNOSIS — Z780 Asymptomatic menopausal state: Secondary | ICD-10-CM | POA: Diagnosis not present

## 2015-01-16 ENCOUNTER — Other Ambulatory Visit: Payer: Self-pay | Admitting: Family Medicine

## 2015-02-13 ENCOUNTER — Telehealth: Payer: Self-pay

## 2015-02-13 NOTE — Telephone Encounter (Signed)
We could certainly try a different type of blood pressure medication if she would prefer. We can discontinue the losartan try 5 mg of amlodipine daily. She can see if she feels a little bit better. It's not clear to me. If she feeling lightheaded on the medication or if she concerned that her blood pressure was going to low?

## 2015-02-18 ENCOUNTER — Ambulatory Visit (INDEPENDENT_AMBULATORY_CARE_PROVIDER_SITE_OTHER): Payer: Medicare Other | Admitting: Family Medicine

## 2015-02-18 ENCOUNTER — Encounter: Payer: Self-pay | Admitting: Family Medicine

## 2015-02-18 VITALS — BP 132/78 | HR 70 | Wt 199.0 lb

## 2015-02-18 DIAGNOSIS — R55 Syncope and collapse: Secondary | ICD-10-CM

## 2015-02-18 DIAGNOSIS — I10 Essential (primary) hypertension: Secondary | ICD-10-CM

## 2015-02-18 MED ORDER — VALSARTAN 40 MG PO TABS: 40.0000 mg | ORAL_TABLET | Freq: Every day | ORAL | Status: DC

## 2015-02-18 NOTE — Progress Notes (Signed)
   Subjective:    Patient ID: [name], female    DOB: 1933/01/01, 82 y.o.   MRN: [id number]  HPI Hypertension -  pt reports that [name] called last wk about getting lightheaded and almost passed out at church and feels that the losartan is the reason.  [name] fell and hit her tailbone.  [name] has been cutting the losartan in half . [name] stated that [name] never got the rx for the amlodipine filled. [name] is down to half a 25mg  pill for the last week and hasn't happened again.     Review of Systems     Objective:   Physical Exam  Constitutional: [name] is oriented to person, place, and time. [name] appears well-developed and well-nourished.  HENT:  Head: Normocephalic and atraumatic.  Cardiovascular: Normal rate, regular rhythm and normal heart sounds.   Pulmonary/Chest: Effort normal and breath sounds normal.  Neurological: [name] is alert and oriented to person, place, and time.  Skin: Skin is warm and dry.  Psychiatric: [name] has a normal mood and affect. Her behavior is normal.    [name] does have a bruise near her tailbone. Mildly tender.        Assessment & Plan:  HTN - WEll controlled.  WE discussed options. [name] did well on losartan 25mg  for almost a year. We had inc her dose back in September. We could lower the med and add a second agent or try something new .  [name]  will d/c the losartan and try valsartan.  The had tried amlodipine in the past and it had ankle swelling.    Continue metorpolol.  F/U in 4 weeks for BP check.    Near syncope - likely hypotensive episdoes but unclear. Will start with adjusting BP med. Make sure hydrating well.

## 2015-02-18 NOTE — Patient Instructions (Signed)
Ok to start the valsartan tomorrow.  Take one a day.  Evenings are fine.   Make sure hydrating well.

## 2015-03-08 DIAGNOSIS — C50911 Malignant neoplasm of unspecified site of right female breast: Secondary | ICD-10-CM | POA: Diagnosis not present

## 2015-03-08 DIAGNOSIS — Z1231 Encounter for screening mammogram for malignant neoplasm of breast: Secondary | ICD-10-CM | POA: Diagnosis not present

## 2015-03-12 ENCOUNTER — Other Ambulatory Visit: Payer: Self-pay | Admitting: Family Medicine

## 2015-03-18 ENCOUNTER — Ambulatory Visit (INDEPENDENT_AMBULATORY_CARE_PROVIDER_SITE_OTHER): Payer: Medicare Other | Admitting: Family Medicine

## 2015-03-18 ENCOUNTER — Encounter: Payer: Self-pay | Admitting: Family Medicine

## 2015-03-18 ENCOUNTER — Other Ambulatory Visit: Payer: Self-pay | Admitting: Family Medicine

## 2015-03-18 VITALS — BP 136/82 | HR 68 | Wt 200.0 lb

## 2015-03-18 DIAGNOSIS — R7301 Impaired fasting glucose: Secondary | ICD-10-CM

## 2015-03-18 DIAGNOSIS — I10 Essential (primary) hypertension: Secondary | ICD-10-CM

## 2015-03-18 LAB — POCT GLYCOSYLATED HEMOGLOBIN (HGB A1C): HEMOGLOBIN A1C: 5.6

## 2015-03-18 NOTE — Progress Notes (Signed)
   Subjective:    Patient ID: [name], female    DOB: Sep 09, 1932, 82 y.o.   MRN: [id number]  HPI Hypertension- Pt denies chest pain, SOB, dizziness, or heart palpitations.  Taking meds as directed w/o problems.  Denies medication side effects.  Saw her a month ago and we started valsartan. Losartan was making her lightheaded.  She is on metoprolol as well.   IFG - she is doing well. No inc thirst or urination.     Review of Systems     Objective:   Physical Exam  Constitutional: She is oriented to person, place, and time. She appears well-developed and well-nourished.  HENT:  Head: Normocephalic and atraumatic.  Cardiovascular: Normal rate, regular rhythm and normal heart sounds.   Pulmonary/Chest: Effort normal and breath sounds normal.  Musculoskeletal:  Trace edema on the left ankle   Neurological: She is alert and oriented to person, place, and time.  Skin: Skin is warm and dry.  Psychiatric: She has a normal mood and affect. Her behavior is normal.          Assessment & Plan:  HTN  - She feels like she's doing much better on her current regimen. Pressure at goal. Continue current regimen. Follow-up in 6 months. Continue metoprolol and valsartan. Continue to monitor blood pressure at home and call if she starts to see elevated pressures again.  IFG - A1c looks stable today at looks great. Follow-up in 6-12 months. Lab Results  Component Value Date   HGBA1C 5.6 03/18/2015

## 2015-03-20 ENCOUNTER — Other Ambulatory Visit: Payer: Self-pay | Admitting: Family Medicine

## 2015-03-20 NOTE — Telephone Encounter (Signed)
Okay to deny. May need to call the pharmacy. This has to be sent to her rheumatologist not to me.

## 2015-03-22 ENCOUNTER — Other Ambulatory Visit: Payer: Self-pay | Admitting: Family Medicine

## 2015-03-25 NOTE — Telephone Encounter (Signed)
Her rheumatologist left town but we have placed an referral back in September. Please call patient to see if she has been able to get established before we refill the medication. She should've had an appointment by now. Plaquenil should really come from rheumatology.

## 2015-03-26 ENCOUNTER — Telehealth: Payer: Self-pay

## 2015-03-26 NOTE — Telephone Encounter (Signed)
We referred her to a new rheumatologist back in September. We need to see where we are at on the status of this. She go. I don't mind feeling the Plaquenil for short-term until she gets in that we made the appointment over 5 months ago so she should've been seen by now.

## 2015-03-27 NOTE — Telephone Encounter (Signed)
After research on this, Dr. Beverely Low has gone into private practice.  She said yesterday that both the MD's had quit practicing.  I spoke with the MD office and they are going to renew her medication and make an appointment.  She was seen by then in October.

## 2015-03-29 DIAGNOSIS — H35353 Cystoid macular degeneration, bilateral: Secondary | ICD-10-CM | POA: Diagnosis not present

## 2015-03-29 LAB — HM DIABETES EYE EXAM

## 2015-04-02 DIAGNOSIS — M159 Polyosteoarthritis, unspecified: Secondary | ICD-10-CM | POA: Diagnosis not present

## 2015-04-02 DIAGNOSIS — I10 Essential (primary) hypertension: Secondary | ICD-10-CM | POA: Diagnosis not present

## 2015-04-02 DIAGNOSIS — L932 Other local lupus erythematosus: Secondary | ICD-10-CM | POA: Diagnosis not present

## 2015-04-02 DIAGNOSIS — E119 Type 2 diabetes mellitus without complications: Secondary | ICD-10-CM | POA: Diagnosis not present

## 2015-04-08 NOTE — Telephone Encounter (Signed)
Pt seeing Rheumatologist with [facility] who is handling her medications.

## 2015-04-11 DIAGNOSIS — H353221 Exudative age-related macular degeneration, left eye, with active choroidal neovascularization: Secondary | ICD-10-CM | POA: Diagnosis not present

## 2015-04-11 DIAGNOSIS — H43822 Vitreomacular adhesion, left eye: Secondary | ICD-10-CM | POA: Diagnosis not present

## 2015-04-11 DIAGNOSIS — Z961 Presence of intraocular lens: Secondary | ICD-10-CM | POA: Diagnosis not present

## 2015-04-11 DIAGNOSIS — E119 Type 2 diabetes mellitus without complications: Secondary | ICD-10-CM | POA: Diagnosis not present

## 2015-04-11 LAB — HM DIABETES EYE EXAM

## 2015-04-16 ENCOUNTER — Other Ambulatory Visit: Payer: Self-pay | Admitting: Family Medicine

## 2015-04-25 ENCOUNTER — Encounter: Payer: Self-pay | Admitting: Family Medicine

## 2015-05-02 DIAGNOSIS — H43822 Vitreomacular adhesion, left eye: Secondary | ICD-10-CM | POA: Diagnosis not present

## 2015-06-13 DIAGNOSIS — H353221 Exudative age-related macular degeneration, left eye, with active choroidal neovascularization: Secondary | ICD-10-CM | POA: Diagnosis not present

## 2015-06-13 DIAGNOSIS — H35353 Cystoid macular degeneration, bilateral: Secondary | ICD-10-CM | POA: Diagnosis not present

## 2015-06-18 ENCOUNTER — Ambulatory Visit (INDEPENDENT_AMBULATORY_CARE_PROVIDER_SITE_OTHER): Payer: Medicare Other | Admitting: Family Medicine

## 2015-06-18 ENCOUNTER — Encounter: Payer: Self-pay | Admitting: Family Medicine

## 2015-06-18 VITALS — BP 140/86 | HR 70 | Wt 198.0 lb

## 2015-06-18 DIAGNOSIS — R208 Other disturbances of skin sensation: Secondary | ICD-10-CM

## 2015-06-18 DIAGNOSIS — I10 Essential (primary) hypertension: Secondary | ICD-10-CM

## 2015-06-18 DIAGNOSIS — G629 Polyneuropathy, unspecified: Secondary | ICD-10-CM

## 2015-06-18 DIAGNOSIS — M25473 Effusion, unspecified ankle: Secondary | ICD-10-CM

## 2015-06-18 DIAGNOSIS — F325 Major depressive disorder, single episode, in full remission: Secondary | ICD-10-CM | POA: Diagnosis not present

## 2015-06-18 DIAGNOSIS — R209 Unspecified disturbances of skin sensation: Secondary | ICD-10-CM

## 2015-06-18 DIAGNOSIS — R600 Localized edema: Secondary | ICD-10-CM

## 2015-06-18 DIAGNOSIS — E785 Hyperlipidemia, unspecified: Secondary | ICD-10-CM | POA: Diagnosis not present

## 2015-06-18 DIAGNOSIS — L930 Discoid lupus erythematosus: Secondary | ICD-10-CM

## 2015-06-18 DIAGNOSIS — R609 Edema, unspecified: Secondary | ICD-10-CM

## 2015-06-18 NOTE — Progress Notes (Addendum)
  Subjective:    CC: BP  HPI: Hypertension- Pt denies chest pain, SOB, dizziness, or heart palpitations.  Taking meds as directed w/o problems.  Denies medication side effects.    Hyperlipidemia-not currently on medication.  Depression - Overall doing fair. She rates her depressive symptoms is somewhat difficult. She is currently on Cymbalta 60 mg daily.  She also complains of numbness in her feet. She has a diagnosis of neuropathy, idiopathic. She says it really doesn't hurt it just feels numb. She has noticed a little bit more swelling around her ankles recently. She has had this problem before. This is this time they actually feel cold to touch.  Past medical history, Surgical history, Family history not pertinant except as noted below, Social history, Allergies, and medications have been entered into the medical record, reviewed, and corrections made.   Review of Systems: No fevers, chills, night sweats, weight loss, chest pain, or shortness of breath.   Objective:    General: Well Developed, well nourished, and in no acute distress.  Neuro: Alert and oriented x3, extra-ocular muscles intact, sensation grossly intact.  HEENT: Normocephalic, atraumatic  Skin: Warm and dry, no rashes. Cardiac: Regular rate and rhythm, no murmurs rubs or gallops, no lower extremity edema.  Respiratory: Clear to auscultation bilaterally. Not using accessory muscles, speaking in full sentences.   Impression and Recommendations:    HTN - Well controlled. Continue current regimen. Follow up in 6 months  Hyperlipidemia - Due to recheck lipid levels. Not currently on medication.  Depression - HQ 9 score of 7, she rates her symptoms is somewhat difficult. Continue with current regimen.  Cold feet-I am able to feel a dorsal pedal pulse on the left foot. Will schedule her for ABIs to see if she could have some arterial insufficiency versus some venous stasis causing some looks to me swelling.  Lower  extremity swelling-trace edema bilaterally.  Monitor salt intake in diet. Will schedule for ABIs as as above.  Neuropathy-no pain at this time.  Lupus-we'll get additional blood work and will fax over to Dr. Azzie Almas office.

## 2015-06-20 ENCOUNTER — Telehealth: Payer: Self-pay | Admitting: *Deleted

## 2015-06-20 DIAGNOSIS — R209 Unspecified disturbances of skin sensation: Secondary | ICD-10-CM

## 2015-06-20 DIAGNOSIS — M25473 Effusion, unspecified ankle: Secondary | ICD-10-CM

## 2015-06-20 NOTE — Telephone Encounter (Signed)
Release order

## 2015-06-21 DIAGNOSIS — L930 Discoid lupus erythematosus: Secondary | ICD-10-CM | POA: Diagnosis not present

## 2015-06-21 DIAGNOSIS — E559 Vitamin D deficiency, unspecified: Secondary | ICD-10-CM | POA: Diagnosis not present

## 2015-06-21 DIAGNOSIS — I10 Essential (primary) hypertension: Secondary | ICD-10-CM | POA: Diagnosis not present

## 2015-06-21 DIAGNOSIS — E785 Hyperlipidemia, unspecified: Secondary | ICD-10-CM | POA: Diagnosis not present

## 2015-06-21 LAB — CBC WITH DIFFERENTIAL/PLATELET
BASOS ABS: 0 {cells}/uL (ref 0–200)
Basophils Relative: 0 %
EOS ABS: 49 {cells}/uL (ref 15–500)
Eosinophils Relative: 1 %
HEMATOCRIT: 35.7 % (ref 35.0–45.0)
Hemoglobin: 11.6 g/dL — ABNORMAL LOW (ref 11.7–15.5)
LYMPHS PCT: 30 %
Lymphs Abs: 1470 cells/uL (ref 850–3900)
MCH: 29.3 pg (ref 27.0–33.0)
MCHC: 32.5 g/dL (ref 32.0–36.0)
MCV: 90.2 fL (ref 80.0–100.0)
MONO ABS: 294 {cells}/uL (ref 200–950)
MPV: 9.8 fL (ref 7.5–12.5)
Monocytes Relative: 6 %
NEUTROS PCT: 63 %
Neutro Abs: 3087 cells/uL (ref 1500–7800)
Platelets: 187 10*3/uL (ref 140–400)
RBC: 3.96 MIL/uL (ref 3.80–5.10)
RDW: 15.3 % — AB (ref 11.0–15.0)
WBC: 4.9 10*3/uL (ref 3.8–10.8)

## 2015-06-22 LAB — URINALYSIS

## 2015-06-22 LAB — LIPID PANEL
CHOL/HDL RATIO: 2.2 ratio (ref <=5.0)
CHOLESTEROL: 194 mg/dL (ref 125–200)
HDL: 88 mg/dL (ref >=46)
LDL Cholesterol: 91 mg/dL (ref <130)
Triglycerides: 75 mg/dL (ref <150)
VLDL: 15 mg/dL (ref <30)

## 2015-06-22 LAB — COMPLETE METABOLIC PANEL WITH GFR
ALT: 10 U/L (ref 6–29)
AST: 15 U/L (ref 10–35)
Albumin: 3.9 g/dL (ref 3.6–5.1)
Alkaline Phosphatase: 74 U/L (ref 33–130)
BUN: 28 mg/dL — AB (ref 7–25)
CHLORIDE: 103 mmol/L (ref 98–110)
CO2: 24 mmol/L (ref 20–31)
Calcium: 9.3 mg/dL (ref 8.6–10.4)
Creat: 0.99 mg/dL — ABNORMAL HIGH (ref 0.60–0.88)
GFR, Est African American: 62 mL/min (ref >=60)
GFR, Est Non African American: 54 mL/min — ABNORMAL LOW (ref >=60)
GLUCOSE: 118 mg/dL — AB (ref 65–99)
POTASSIUM: 3.8 mmol/L (ref 3.5–5.3)
SODIUM: 142 mmol/L (ref 135–146)
Total Bilirubin: 0.5 mg/dL (ref 0.2–1.2)
Total Protein: 6.2 g/dL (ref 6.1–8.1)

## 2015-06-22 LAB — SEDIMENTATION RATE: Sed Rate: 16 mm/hr (ref 0–30)

## 2015-06-22 LAB — VITAMIN D 25 HYDROXY (VIT D DEFICIENCY, FRACTURES): Vit D, 25-Hydroxy: 33 ng/mL (ref 30–100)

## 2015-06-24 ENCOUNTER — Other Ambulatory Visit: Payer: Self-pay | Admitting: Family Medicine

## 2015-06-24 DIAGNOSIS — I739 Peripheral vascular disease, unspecified: Secondary | ICD-10-CM

## 2015-06-24 LAB — ANA: ANA: POSITIVE — AB

## 2015-06-24 LAB — ANTI-DNA ANTIBODY, DOUBLE-STRANDED: ds DNA Ab: 9 IU/mL — ABNORMAL HIGH

## 2015-06-24 LAB — ANTI-NUCLEAR AB-TITER (ANA TITER)

## 2015-06-24 LAB — C3 AND C4
C3 Complement: 145 mg/dL (ref 90–180)
C4 Complement: 34 mg/dL (ref 16–47)

## 2015-06-27 ENCOUNTER — Other Ambulatory Visit: Payer: Self-pay | Admitting: Family Medicine

## 2015-07-04 ENCOUNTER — Ambulatory Visit (HOSPITAL_COMMUNITY)
Admission: RE | Admit: 2015-07-04 | Discharge: 2015-07-04 | Disposition: A | Discharge status: Home / Self Care | Payer: Medicare Other | Source: Ambulatory Visit | Attending: Cardiovascular Disease | Admitting: Cardiovascular Disease

## 2015-07-04 DIAGNOSIS — E785 Hyperlipidemia, unspecified: Secondary | ICD-10-CM | POA: Diagnosis not present

## 2015-07-04 DIAGNOSIS — F329 Major depressive disorder, single episode, unspecified: Secondary | ICD-10-CM | POA: Diagnosis not present

## 2015-07-04 DIAGNOSIS — E11319 Type 2 diabetes mellitus with unspecified diabetic retinopathy without macular edema: Secondary | ICD-10-CM | POA: Diagnosis not present

## 2015-07-04 DIAGNOSIS — I739 Peripheral vascular disease, unspecified: Secondary | ICD-10-CM | POA: Insufficient documentation

## 2015-07-04 DIAGNOSIS — R938 Abnormal findings on diagnostic imaging of other specified body structures: Secondary | ICD-10-CM | POA: Diagnosis not present

## 2015-07-04 DIAGNOSIS — E1142 Type 2 diabetes mellitus with diabetic polyneuropathy: Secondary | ICD-10-CM | POA: Insufficient documentation

## 2015-07-04 DIAGNOSIS — I779 Disorder of arteries and arterioles, unspecified: Secondary | ICD-10-CM | POA: Diagnosis not present

## 2015-07-04 DIAGNOSIS — I10 Essential (primary) hypertension: Secondary | ICD-10-CM | POA: Diagnosis not present

## 2015-07-04 DIAGNOSIS — F419 Anxiety disorder, unspecified: Secondary | ICD-10-CM | POA: Insufficient documentation

## 2015-07-09 ENCOUNTER — Telehealth: Payer: Self-pay | Admitting: *Deleted

## 2015-07-09 DIAGNOSIS — I739 Peripheral vascular disease, unspecified: Secondary | ICD-10-CM

## 2015-07-09 NOTE — Telephone Encounter (Signed)
referral

## 2015-07-26 ENCOUNTER — Telehealth: Payer: Self-pay

## 2015-07-26 MED ORDER — FUROSEMIDE 20 MG PO TABS: 20.0000 mg | ORAL_TABLET | Freq: Every day | ORAL | Status: DC | PRN

## 2015-07-26 NOTE — Telephone Encounter (Signed)
[name] called and states she has bilateral swelling in legs. She would like some fluid pills. Denies SOB, chest pain, dizziness or headaches. She states she has taken fluid pills in the past. He appointment with the vein specialist is in August.

## 2015-07-26 NOTE — Addendum Note (Signed)
Addended by: Beatrice Lecher D on: 07/26/2015 02:32 PM   Modules accepted: Miquel Dunn

## 2015-07-26 NOTE — Telephone Encounter (Signed)
Patient advised of recommendations.  

## 2015-07-26 NOTE — Telephone Encounter (Signed)
Will fax rx to pharmacy to use occ/

## 2015-08-15 DIAGNOSIS — H35353 Cystoid macular degeneration, bilateral: Secondary | ICD-10-CM | POA: Diagnosis not present

## 2015-08-15 DIAGNOSIS — H353221 Exudative age-related macular degeneration, left eye, with active choroidal neovascularization: Secondary | ICD-10-CM | POA: Diagnosis not present

## 2015-08-21 DIAGNOSIS — R29898 Other symptoms and signs involving the musculoskeletal system: Secondary | ICD-10-CM | POA: Diagnosis not present

## 2015-08-22 DIAGNOSIS — H353221 Exudative age-related macular degeneration, left eye, with active choroidal neovascularization: Secondary | ICD-10-CM | POA: Diagnosis not present

## 2015-08-22 DIAGNOSIS — H3092 Unspecified chorioretinal inflammation, left eye: Secondary | ICD-10-CM | POA: Diagnosis not present

## 2015-08-22 LAB — HM DIABETES EYE EXAM

## 2015-09-18 ENCOUNTER — Ambulatory Visit (INDEPENDENT_AMBULATORY_CARE_PROVIDER_SITE_OTHER): Payer: Medicare Other | Admitting: Family Medicine

## 2015-09-18 ENCOUNTER — Encounter: Payer: Self-pay | Admitting: Family Medicine

## 2015-09-18 VITALS — BP 138/86 | HR 72 | Wt 198.0 lb

## 2015-09-18 DIAGNOSIS — Z23 Encounter for immunization: Secondary | ICD-10-CM | POA: Diagnosis not present

## 2015-09-18 DIAGNOSIS — R7301 Impaired fasting glucose: Secondary | ICD-10-CM

## 2015-09-18 DIAGNOSIS — I10 Essential (primary) hypertension: Secondary | ICD-10-CM | POA: Diagnosis not present

## 2015-09-18 DIAGNOSIS — G609 Hereditary and idiopathic neuropathy, unspecified: Secondary | ICD-10-CM

## 2015-09-18 LAB — POCT GLYCOSYLATED HEMOGLOBIN (HGB A1C): HEMOGLOBIN A1C: 5.4

## 2015-09-18 MED ORDER — AMLODIPINE BESYLATE 2.5 MG PO TABS: 2.5000 mg | ORAL_TABLET | Freq: Every day | ORAL | 0 refills | Status: DC

## 2015-09-18 MED ORDER — OXYBUTYNIN CHLORIDE 5 MG PO TABS: 5.0000 mg | ORAL_TABLET | Freq: Two times a day (BID) | ORAL | 3 refills | Status: DC

## 2015-09-18 NOTE — Progress Notes (Addendum)
Subjective:    CC: HTN  HPI: Hypertension- Pt denies chest pain, SOB, dizziness, or heart palpitations.  Taking meds as directed w/o problems.  Denies medication side effects.  She feels like she really works hard to eat a low-salt diet this is her home blood pressures have been fluctuating up and down. She says yesterday her diastolic blood pressure was 125.  IFG - No increased thirst or urination.  Lower extremity weakness and pain-she did see the vascular doctor. He felt like her blood flow was good and recommended that she actually see a neurologist for further evaluation.  She recently fell in the middle the night in her bathroom. She did call EMS because she had difficulty getting up on her own. They did assist her and she had some bruising which is just about completely healed. She did not fracture anything.  Past medical history, Surgical history, Family history not pertinant except as noted below, Social history, Allergies, and medications have been entered into the medical record, reviewed, and corrections made.   Review of Systems: No fevers, chills, night sweats, weight loss, chest pain, or shortness of breath.   Objective:    General: Well Developed, well nourished, and in no acute distress.  Neuro: Alert and oriented x3, extra-ocular muscles intact, sensation grossly intact.  HEENT: Normocephalic, atraumatic  Skin: Warm and dry, no rashes. Cardiac: Regular rate and rhythm, no murmurs rubs or gallops, no lower extremity edema.  Respiratory: Clear to auscultation bilaterally. Not using accessory muscles, speaking in full sentences.   Impression and Recommendations:   HTN - Well controlled in the office here today, though she is reporting elevated blood pressures at home. We'll continue to monitor the next couple of weeks and see if it stabilizes. If not then we may consider adding a third agent. She has been intolerant to amlodipine in the past. Continue current regimen.  Follow up in  6 mo.   IFG - well controlled. Recheck in 6-12 months.  Lab Results  Component Value Date   HGBA1C 5.6 03/18/2015   Lower extremity weakness and pain-most likely peripheral neuropathy. Will refer to neurology for further evaluation. I did also review some chair exercises with her to work on strengthening her thighs since she did also recently have a fall.  See note in care everywhere.

## 2015-09-19 ENCOUNTER — Encounter: Payer: Self-pay | Admitting: Family Medicine

## 2015-09-20 DIAGNOSIS — H35353 Cystoid macular degeneration, bilateral: Secondary | ICD-10-CM | POA: Diagnosis not present

## 2015-09-20 LAB — HM DIABETES EYE EXAM

## 2015-10-03 DIAGNOSIS — R760 Raised antibody titer: Secondary | ICD-10-CM | POA: Diagnosis not present

## 2015-10-03 DIAGNOSIS — G2581 Restless legs syndrome: Secondary | ICD-10-CM | POA: Diagnosis not present

## 2015-10-03 DIAGNOSIS — M5417 Radiculopathy, lumbosacral region: Secondary | ICD-10-CM | POA: Diagnosis not present

## 2015-10-03 DIAGNOSIS — G603 Idiopathic progressive neuropathy: Secondary | ICD-10-CM | POA: Diagnosis not present

## 2015-10-03 DIAGNOSIS — E531 Pyridoxine deficiency: Secondary | ICD-10-CM | POA: Diagnosis not present

## 2015-10-03 DIAGNOSIS — E538 Deficiency of other specified B group vitamins: Secondary | ICD-10-CM | POA: Diagnosis not present

## 2015-10-16 ENCOUNTER — Ambulatory Visit (INDEPENDENT_AMBULATORY_CARE_PROVIDER_SITE_OTHER): Payer: Medicare Other | Admitting: Family Medicine

## 2015-10-16 VITALS — BP 132/76 | HR 70 | Ht 63.0 in | Wt 197.0 lb

## 2015-10-16 DIAGNOSIS — I10 Essential (primary) hypertension: Secondary | ICD-10-CM

## 2015-10-16 DIAGNOSIS — F325 Major depressive disorder, single episode, in full remission: Secondary | ICD-10-CM

## 2015-10-16 MED ORDER — METOPROLOL TARTRATE 100 MG PO TABS: 100.0000 mg | ORAL_TABLET | Freq: Two times a day (BID) | ORAL | 3 refills | Status: DC

## 2015-10-16 MED ORDER — VALSARTAN 40 MG PO TABS: ORAL_TABLET | ORAL | 3 refills | Status: DC

## 2015-10-16 MED ORDER — OXYBUTYNIN CHLORIDE 5 MG PO TABS: 5.0000 mg | ORAL_TABLET | Freq: Two times a day (BID) | ORAL | 3 refills | Status: DC

## 2015-10-16 MED ORDER — DULOXETINE HCL 60 MG PO CPEP: 60.0000 mg | ORAL_CAPSULE | Freq: Every day | ORAL | 3 refills | Status: DC

## 2015-10-16 NOTE — Progress Notes (Signed)
   Subjective:    Patient ID: [name], female    DOB: March 28, 1932, 82 y.o.   MRN: [id number]  HPI Hypertension- Pt denies chest pain, SOB, dizziness, or heart palpitations.  Taking meds as directed w/o problems.  Denies medication side effects.  Came back in today because of her blood pressures were elevated last saw her about a month ago. She says it seems to have stabilized again that her home blood pressure cuff is now broken. But she was just seen at another office earlier this week and her blood pressure was great.  Depression-currently on Cymbalta 60 mg daily. Last PHQ 9 score was 7. She feels like overall she is doing okay with her regimen. No specific concerns or complaints today.   Review of Systems     Objective:   Physical Exam  Constitutional: She is oriented to person, place, and time. She appears well-developed and well-nourished.  HENT:  Head: Normocephalic and atraumatic.  Cardiovascular: Normal rate, regular rhythm and normal heart sounds.   Pulmonary/Chest: Effort normal and breath sounds normal.  Neurological: She is alert and oriented to person, place, and time.  Skin: Skin is warm and dry.  Psychiatric: She has a normal mood and affect. Her behavior is normal.        Assessment & Plan:  HTN - Well controlled. Looks fantastic. Follow-up in 6 months.   Depression-PHQ 9 score improved form last time.  We'll continue current regimen and see her back in 6 months.

## 2015-10-17 ENCOUNTER — Encounter: Payer: Self-pay | Admitting: Family Medicine

## 2015-10-17 DIAGNOSIS — M329 Systemic lupus erythematosus, unspecified: Secondary | ICD-10-CM | POA: Diagnosis not present

## 2015-10-17 DIAGNOSIS — G2581 Restless legs syndrome: Secondary | ICD-10-CM | POA: Diagnosis not present

## 2015-10-17 DIAGNOSIS — G603 Idiopathic progressive neuropathy: Secondary | ICD-10-CM | POA: Diagnosis not present

## 2015-10-17 DIAGNOSIS — M5417 Radiculopathy, lumbosacral region: Secondary | ICD-10-CM | POA: Diagnosis not present

## 2015-10-24 DIAGNOSIS — H35353 Cystoid macular degeneration, bilateral: Secondary | ICD-10-CM | POA: Diagnosis not present

## 2015-10-24 LAB — HM DIABETES EYE EXAM

## 2015-10-31 DIAGNOSIS — M4726 Other spondylosis with radiculopathy, lumbar region: Secondary | ICD-10-CM | POA: Diagnosis not present

## 2015-10-31 DIAGNOSIS — M545 Low back pain: Secondary | ICD-10-CM | POA: Diagnosis not present

## 2015-11-13 DIAGNOSIS — Z08 Encounter for follow-up examination after completed treatment for malignant neoplasm: Secondary | ICD-10-CM | POA: Diagnosis not present

## 2015-11-13 DIAGNOSIS — M48061 Spinal stenosis, lumbar region without neurogenic claudication: Secondary | ICD-10-CM | POA: Diagnosis not present

## 2015-11-13 DIAGNOSIS — Z171 Estrogen receptor negative status [ER-]: Secondary | ICD-10-CM | POA: Diagnosis not present

## 2015-11-13 DIAGNOSIS — I10 Essential (primary) hypertension: Secondary | ICD-10-CM | POA: Diagnosis not present

## 2015-11-13 DIAGNOSIS — C50011 Malignant neoplasm of nipple and areola, right female breast: Secondary | ICD-10-CM | POA: Diagnosis not present

## 2015-11-13 DIAGNOSIS — M3219 Other organ or system involvement in systemic lupus erythematosus: Secondary | ICD-10-CM | POA: Diagnosis not present

## 2015-11-13 DIAGNOSIS — Z853 Personal history of malignant neoplasm of breast: Secondary | ICD-10-CM | POA: Diagnosis not present

## 2015-11-14 DIAGNOSIS — M48061 Spinal stenosis, lumbar region without neurogenic claudication: Secondary | ICD-10-CM | POA: Diagnosis not present

## 2015-11-14 DIAGNOSIS — S22000S Wedge compression fracture of unspecified thoracic vertebra, sequela: Secondary | ICD-10-CM | POA: Diagnosis not present

## 2015-11-14 DIAGNOSIS — M9983 Other biomechanical lesions of lumbar region: Secondary | ICD-10-CM | POA: Diagnosis not present

## 2015-11-21 ENCOUNTER — Other Ambulatory Visit: Payer: Self-pay | Admitting: Family Medicine

## 2015-11-26 DIAGNOSIS — E119 Type 2 diabetes mellitus without complications: Secondary | ICD-10-CM | POA: Diagnosis not present

## 2015-11-26 DIAGNOSIS — H35353 Cystoid macular degeneration, bilateral: Secondary | ICD-10-CM | POA: Diagnosis not present

## 2015-11-26 DIAGNOSIS — H35372 Puckering of macula, left eye: Secondary | ICD-10-CM | POA: Diagnosis not present

## 2015-11-26 DIAGNOSIS — H353221 Exudative age-related macular degeneration, left eye, with active choroidal neovascularization: Secondary | ICD-10-CM | POA: Diagnosis not present

## 2015-12-02 DIAGNOSIS — H353221 Exudative age-related macular degeneration, left eye, with active choroidal neovascularization: Secondary | ICD-10-CM | POA: Diagnosis not present

## 2015-12-02 LAB — HM DIABETES EYE EXAM

## 2015-12-10 ENCOUNTER — Encounter: Payer: Self-pay | Admitting: Family Medicine

## 2015-12-23 ENCOUNTER — Other Ambulatory Visit: Payer: Self-pay | Admitting: Family Medicine

## 2015-12-23 DIAGNOSIS — H353221 Exudative age-related macular degeneration, left eye, with active choroidal neovascularization: Secondary | ICD-10-CM | POA: Diagnosis not present

## 2015-12-30 DIAGNOSIS — H3092 Unspecified chorioretinal inflammation, left eye: Secondary | ICD-10-CM | POA: Diagnosis not present

## 2015-12-30 DIAGNOSIS — H353221 Exudative age-related macular degeneration, left eye, with active choroidal neovascularization: Secondary | ICD-10-CM | POA: Diagnosis not present

## 2015-12-30 DIAGNOSIS — H35353 Cystoid macular degeneration, bilateral: Secondary | ICD-10-CM | POA: Diagnosis not present

## 2016-01-07 ENCOUNTER — Encounter: Payer: Self-pay | Admitting: Family Medicine

## 2016-02-06 DIAGNOSIS — M48061 Spinal stenosis, lumbar region without neurogenic claudication: Secondary | ICD-10-CM | POA: Diagnosis not present

## 2016-02-06 DIAGNOSIS — M9983 Other biomechanical lesions of lumbar region: Secondary | ICD-10-CM | POA: Diagnosis not present

## 2016-02-06 DIAGNOSIS — S22000S Wedge compression fracture of unspecified thoracic vertebra, sequela: Secondary | ICD-10-CM | POA: Diagnosis not present

## 2016-02-17 DIAGNOSIS — H35353 Cystoid macular degeneration, bilateral: Secondary | ICD-10-CM | POA: Diagnosis not present

## 2016-02-17 DIAGNOSIS — H353221 Exudative age-related macular degeneration, left eye, with active choroidal neovascularization: Secondary | ICD-10-CM | POA: Diagnosis not present

## 2016-02-17 LAB — HM DIABETES EYE EXAM

## 2016-02-27 DIAGNOSIS — S22081G Stable burst fracture of T11-T12 vertebra, subsequent encounter for fracture with delayed healing: Secondary | ICD-10-CM | POA: Diagnosis not present

## 2016-02-28 ENCOUNTER — Other Ambulatory Visit: Payer: Self-pay | Admitting: Family Medicine

## 2016-03-05 DIAGNOSIS — M48061 Spinal stenosis, lumbar region without neurogenic claudication: Secondary | ICD-10-CM | POA: Diagnosis not present

## 2016-03-05 DIAGNOSIS — S22000S Wedge compression fracture of unspecified thoracic vertebra, sequela: Secondary | ICD-10-CM | POA: Diagnosis not present

## 2016-03-05 DIAGNOSIS — M9983 Other biomechanical lesions of lumbar region: Secondary | ICD-10-CM | POA: Diagnosis not present

## 2016-03-11 DIAGNOSIS — S134XXA Sprain of ligaments of cervical spine, initial encounter: Secondary | ICD-10-CM | POA: Diagnosis not present

## 2016-03-16 DIAGNOSIS — H353221 Exudative age-related macular degeneration, left eye, with active choroidal neovascularization: Secondary | ICD-10-CM | POA: Diagnosis not present

## 2016-03-16 DIAGNOSIS — H35353 Cystoid macular degeneration, bilateral: Secondary | ICD-10-CM | POA: Diagnosis not present

## 2016-03-20 ENCOUNTER — Other Ambulatory Visit: Payer: Self-pay | Admitting: Family Medicine

## 2016-03-20 ENCOUNTER — Encounter: Payer: Self-pay | Admitting: Family Medicine

## 2016-03-25 DIAGNOSIS — H353221 Exudative age-related macular degeneration, left eye, with active choroidal neovascularization: Secondary | ICD-10-CM | POA: Diagnosis not present

## 2016-03-25 DIAGNOSIS — H3092 Unspecified chorioretinal inflammation, left eye: Secondary | ICD-10-CM | POA: Diagnosis not present

## 2016-04-08 DIAGNOSIS — M542 Cervicalgia: Secondary | ICD-10-CM | POA: Diagnosis not present

## 2016-04-08 DIAGNOSIS — M50321 Other cervical disc degeneration at C4-C5 level: Secondary | ICD-10-CM | POA: Diagnosis not present

## 2016-04-08 DIAGNOSIS — M5033 Other cervical disc degeneration, cervicothoracic region: Secondary | ICD-10-CM | POA: Diagnosis not present

## 2016-04-08 DIAGNOSIS — M5410 Radiculopathy, site unspecified: Secondary | ICD-10-CM | POA: Diagnosis not present

## 2016-04-15 ENCOUNTER — Encounter: Payer: Self-pay | Admitting: Family Medicine

## 2016-04-15 ENCOUNTER — Ambulatory Visit (INDEPENDENT_AMBULATORY_CARE_PROVIDER_SITE_OTHER): Payer: Medicare Other | Admitting: Family Medicine

## 2016-04-15 VITALS — BP 136/78 | HR 66 | Ht 63.0 in | Wt 198.0 lb

## 2016-04-15 DIAGNOSIS — L930 Discoid lupus erythematosus: Secondary | ICD-10-CM

## 2016-04-15 DIAGNOSIS — I10 Essential (primary) hypertension: Secondary | ICD-10-CM | POA: Diagnosis not present

## 2016-04-15 DIAGNOSIS — E785 Hyperlipidemia, unspecified: Secondary | ICD-10-CM | POA: Diagnosis not present

## 2016-04-15 DIAGNOSIS — F325 Major depressive disorder, single episode, in full remission: Secondary | ICD-10-CM

## 2016-04-15 DIAGNOSIS — R7301 Impaired fasting glucose: Secondary | ICD-10-CM

## 2016-04-15 LAB — POCT GLYCOSYLATED HEMOGLOBIN (HGB A1C): Hemoglobin A1C: 5.6

## 2016-04-15 MED ORDER — AMLODIPINE BESYLATE 2.5 MG PO TABS: 2.5000 mg | ORAL_TABLET | Freq: Every day | ORAL | 3 refills | Status: DC

## 2016-04-15 NOTE — Progress Notes (Signed)
Subjective:    CC: Pretension, glucose, mood.  HPI:  Hypertension- Pt denies chest pain, SOB, dizziness, or heart palpitations.  Taking meds as directed w/o problems.  Denies medication side effects.  We restarted a low-dose of amlodipine in September and she actually has tolerated it well and says that she's actually noticed that her ankle swelling has been better.  Impaired fasting glucose-no increased thirst or urination. No symptoms consistent with hypoglycemia.  F/U Depression - Doing well on Cymbalta. No specific problems or complaints that she says Sunday she does feel down but it's more so just because of her health problems and her inability. She says she is usually able to sort of talk herself out of it.   Past medical history, Surgical history, Family history not pertinant except as noted below, Social history, Allergies, and medications have been entered into the medical record, reviewed, and corrections made.   Review of Systems: No fevers, chills, night sweats, weight loss, chest pain, or shortness of breath.   Objective:    General: Well Developed, well nourished, and in no acute distress.  Neuro: Alert and oriented x3, extra-ocular muscles intact, sensation grossly intact.  HEENT: Normocephalic, atraumatic  Skin: Warm and dry, no rashes. Cardiac: Regular rate and rhythm, no murmurs rubs or gallops. Bilateral lower extremity edema, 1+ on the left ankle and trace on the right ankle. Respiratory: Clear to auscultation bilaterally. Not using accessory muscles, speaking in full sentences.   Impression and Recommendations:    HTN - Well controlled. Continue current regimen. Follow up in  6 months.  Due for CMP.  IFG - Well controlled. Continue current regimen. Follow up in  6 months.    Lab Results  Component Value Date   HGBA1C 5.6 04/15/2016   Depression-continue Cymbalta. PHQ 9 score of 4. Rates her symptoms is somewhat difficult. Call if any palms or  concerns.  Vitamin D deficiency-due to recheck level today.

## 2016-04-16 DIAGNOSIS — M4802 Spinal stenosis, cervical region: Secondary | ICD-10-CM | POA: Diagnosis not present

## 2016-04-16 DIAGNOSIS — M9983 Other biomechanical lesions of lumbar region: Secondary | ICD-10-CM | POA: Diagnosis not present

## 2016-04-16 DIAGNOSIS — S22000S Wedge compression fracture of unspecified thoracic vertebra, sequela: Secondary | ICD-10-CM | POA: Diagnosis not present

## 2016-04-16 DIAGNOSIS — M48061 Spinal stenosis, lumbar region without neurogenic claudication: Secondary | ICD-10-CM | POA: Diagnosis not present

## 2016-04-24 DIAGNOSIS — H35353 Cystoid macular degeneration, bilateral: Secondary | ICD-10-CM | POA: Diagnosis not present

## 2016-04-28 ENCOUNTER — Other Ambulatory Visit: Payer: Self-pay | Admitting: Family Medicine

## 2016-05-01 DIAGNOSIS — Z1231 Encounter for screening mammogram for malignant neoplasm of breast: Secondary | ICD-10-CM | POA: Diagnosis not present

## 2016-05-01 DIAGNOSIS — C50011 Malignant neoplasm of nipple and areola, right female breast: Secondary | ICD-10-CM | POA: Diagnosis not present

## 2016-05-01 DIAGNOSIS — Z171 Estrogen receptor negative status [ER-]: Secondary | ICD-10-CM | POA: Diagnosis not present

## 2016-05-25 ENCOUNTER — Encounter: Payer: Self-pay | Admitting: Internal Medicine

## 2016-05-26 DIAGNOSIS — H35353 Cystoid macular degeneration, bilateral: Secondary | ICD-10-CM | POA: Diagnosis not present

## 2016-05-28 DIAGNOSIS — M4802 Spinal stenosis, cervical region: Secondary | ICD-10-CM | POA: Diagnosis not present

## 2016-05-28 DIAGNOSIS — M9983 Other biomechanical lesions of lumbar region: Secondary | ICD-10-CM | POA: Diagnosis not present

## 2016-05-28 DIAGNOSIS — M48061 Spinal stenosis, lumbar region without neurogenic claudication: Secondary | ICD-10-CM | POA: Diagnosis not present

## 2016-05-28 DIAGNOSIS — S22000S Wedge compression fracture of unspecified thoracic vertebra, sequela: Secondary | ICD-10-CM | POA: Diagnosis not present

## 2016-06-23 DIAGNOSIS — H353111 Nonexudative age-related macular degeneration, right eye, early dry stage: Secondary | ICD-10-CM | POA: Diagnosis not present

## 2016-06-23 DIAGNOSIS — H353221 Exudative age-related macular degeneration, left eye, with active choroidal neovascularization: Secondary | ICD-10-CM | POA: Diagnosis not present

## 2016-06-23 DIAGNOSIS — H43823 Vitreomacular adhesion, bilateral: Secondary | ICD-10-CM | POA: Diagnosis not present

## 2016-06-23 DIAGNOSIS — E119 Type 2 diabetes mellitus without complications: Secondary | ICD-10-CM | POA: Diagnosis not present

## 2016-07-24 ENCOUNTER — Other Ambulatory Visit: Payer: Self-pay | Admitting: Family Medicine

## 2016-07-29 ENCOUNTER — Other Ambulatory Visit: Payer: Self-pay | Admitting: Family Medicine

## 2016-07-31 ENCOUNTER — Other Ambulatory Visit: Payer: Self-pay

## 2016-07-31 MED ORDER — LOSARTAN POTASSIUM 25 MG PO TABS: 25.0000 mg | ORAL_TABLET | Freq: Every day | ORAL | 1 refills | Status: DC

## 2016-07-31 NOTE — Telephone Encounter (Signed)
Recall patient's valsartan. We'll switch to losartan. New prescription sent to [facility].

## 2016-08-17 ENCOUNTER — Telehealth: Payer: Self-pay

## 2016-08-17 NOTE — Telephone Encounter (Signed)
Okay to stop the medication but she definitely needs to come in for an exam just to make sure that she's not having any type of heart failure or blood clot going on.

## 2016-08-17 NOTE — Telephone Encounter (Signed)
Patient called stated that she stated taking Losartan on 07/31/2016 and she is retaing fluid in both legs . She stated that her left leg is swollen the worst  advised patient that she needed to come in to be seen but she insisted that she was told to call back today. Please advise on what to tell the  Patient. Patient also stated that her blood pressure 181/106 and 174/105 on yesterday. Advise if patient needs to come in. Sylvan Sookdeo,CMA

## 2016-08-18 ENCOUNTER — Ambulatory Visit (INDEPENDENT_AMBULATORY_CARE_PROVIDER_SITE_OTHER): Payer: Medicare Other | Admitting: Family Medicine

## 2016-08-18 VITALS — BP 140/78 | HR 75 | Wt 200.0 lb

## 2016-08-18 DIAGNOSIS — R600 Localized edema: Secondary | ICD-10-CM

## 2016-08-18 DIAGNOSIS — Z1321 Encounter for screening for nutritional disorder: Secondary | ICD-10-CM | POA: Diagnosis not present

## 2016-08-18 DIAGNOSIS — L608 Other nail disorders: Secondary | ICD-10-CM

## 2016-08-18 DIAGNOSIS — L930 Discoid lupus erythematosus: Secondary | ICD-10-CM | POA: Diagnosis not present

## 2016-08-18 DIAGNOSIS — R7989 Other specified abnormal findings of blood chemistry: Secondary | ICD-10-CM

## 2016-08-18 DIAGNOSIS — I10 Essential (primary) hypertension: Secondary | ICD-10-CM | POA: Diagnosis not present

## 2016-08-18 DIAGNOSIS — E785 Hyperlipidemia, unspecified: Secondary | ICD-10-CM | POA: Diagnosis not present

## 2016-08-18 MED ORDER — FUROSEMIDE 20 MG PO TABS: 20.0000 mg | ORAL_TABLET | Freq: Every day | ORAL | 0 refills | Status: DC | PRN

## 2016-08-18 NOTE — Telephone Encounter (Signed)
Spoke to patient gave her advise as noted below. [name],CMA  

## 2016-08-18 NOTE — Progress Notes (Signed)
Subjective:    Patient ID: [name], female    DOB: [birth year], 83 y.o.   MRN: [id number]  HPI Switched to losartan from valsartan after the drug recall about 2 weeks ago. [name] is up 2 lbs since April. [name] says [name] didn't notice any big change after the first week but into the second week started to notice that [name] was starting to retain fluid in her lower extremities. No chest pain or shortness of breath.  [name] also may look at her left great toenail. [name] says it'll start her out and then come off again. [name]'s been using Betadine on it. No sign of redness or significant pain or discomfort.   Review of Systems  BP 140/78   Pulse 75   Wt 200 lb (90.7 kg)   BMI 35.43 kg/m     Allergies  Allergen Reactions  . Aspirin Other (See Comments)    ulcer  . Gabapentin Hives and Itching  . Oxycodone Other (See Comments)    Pt unsure  . Simvastatin Other (See Comments)    Lost the use of her legs  . Sulfonamide Derivatives Hives and Itching  . Amlodipine Other (See Comments)    Pedal edema  . Atorvastatin   . Azithromycin   . Beta Adrenergic Blockers   . Captopril   . Codeine   . Fluvastatin Sodium   . Hydrochlorothiazide Other (See Comments)    INcreaseed BUN/Creatinine  . Latex   . Lisinopril Other (See Comments)    Increased BUN/CR  . Lovastatin   . Macrolides And Ketolides   . Rosiglitazone     Past Medical History:  Diagnosis Date  . Adenocarcinoma ([place])    breast, right  . Anemia    iron deficiency- NOS  . Anxiety   . Atrial fibrillation ([place])    setting of pneumonia  . DDD (degenerative disc disease), lumbar    Dr Owens Shark  . Depression   . Diabetic retinopathy    Dr Jodi Mourning  . Discoid lupus   . Gastric ulcer   . Headache(784.0)    chronic  . Hemorrhoids   . Hyperlipidemia   . Hypertension    oral meds  . IBS (irritable bowel syndrome)   . Lumbar disc disease   . Lupus erythematosus    discoid  . Morton neuroma    right- Dr Wardell Honour  .  Peripheral neuropathy   . PUD (peptic ulcer disease)   . Renal insufficiency     Past Surgical History:  Procedure Laterality Date  . ABDOMINAL HYSTERECTOMY     1 oophorectomy  . CHOLECYSTECTOMY  08/2005  . LUMBAR LAMINECTOMY    . right partial mastectomy w/sentinel lymph node biopsy  11-09  . TOTAL KNEE ARTHROPLASTY     both- Dr Devona Konig    Social History   Social History  . Marital status: Widowed    Spouse name: N/A  . Number of children: 3  . Years of education: N/A   Occupational History  . Retired    Social History Main Topics  . Smoking status: Never Smoker  . Smokeless tobacco: Never Used  . Alcohol use 0.6 oz/week    1 Glasses of wine per week     Comment: occasional  . Drug use: No  . Sexual activity: Not on file   Other Topics Concern  . Not on file   Social History Narrative  . No narrative on file    Family History  Problem Relation  Age of Onset  . Diabetes Mother   . Angina Mother   . COPD Father        colon  . Diabetes Father   . Stroke Father   . Hyperlipidemia Father   . Colon cancer Father     Outpatient Encounter Prescriptions as of 08/18/2016  Medication Sig  . amLODipine (NORVASC) 2.5 MG tablet Take 1 tablet (2.5 mg total) by mouth daily.  . Calcium Carbonate-Vitamin D (CALTRATE 600+D) 600-400 MG-UNIT per tablet Take 1 tablet by mouth 2 (two) times daily.   . cyanocobalamin 500 MCG tablet Take 1,000 mcg by mouth daily.   . DULoxetine (CYMBALTA) 60 MG capsule Take 1 capsule (60 mg total) by mouth daily. Will be due for refill next month  . furosemide (LASIX) 20 MG tablet Take 1 tablet (20 mg total) by mouth daily as needed for edema.  . hydroxychloroquine (PLAQUENIL) 200 MG tablet Take by mouth daily.    . Ibuprofen 200 MG CAPS Take by mouth.  . losartan (COZAAR) 25 MG tablet Take 1 tablet (25 mg total) by mouth daily.  . metoprolol (LOPRESSOR) 100 MG tablet Take 1 tablet (100 mg total) by mouth 2 (two) times daily.  Marland Kitchen ofloxacin  (OCUFLOX) 0.3 % ophthalmic solution INSTILL ONE DROP IN THE LEFT EYE FOUR TIMES DAILY  . oxybutynin (DITROPAN) 5 MG tablet Take 1 tablet (5 mg total) by mouth 2 (two) times daily.  . sucralfate (CARAFATE) 1 g tablet TAKE ONE TABLET TWICE DAILY  . [DISCONTINUED] furosemide (LASIX) 20 MG tablet Take 1 tablet (20 mg total) by mouth daily as needed for edema.   No facility-administered encounter medications on file as of 08/18/2016.          Objective:   Physical Exam  Constitutional: [name] is oriented to person, place, and time. [name] appears well-developed and well-nourished.  HENT:  Head: Normocephalic and atraumatic.  Cardiovascular: Normal rate, regular rhythm and normal heart sounds.   Pulmonary/Chest: Effort normal and breath sounds normal.  Neurological: [name] is alert and oriented to person, place, and time.  Skin: Skin is warm and dry.  Trace lower extremity edema bilaterally over the lower legs down to the ankles. Left great toenail absent just some. Keratin layer on top.  Psychiatric: [name] has a normal mood and affect. Her behavior is normal.       Assessment & Plan:  Lower extremity edema-I suspect due to poorly controlled blood pressure. I think when we switch to the losartan it wasn't as effective in controlling her blood pressure and increased pressure has actually increased her venous pressure which is then contributed to some increased swelling. I'm going to refill her furosemide so that [name] can take it as needed. It was last filled about a year ago.  Hypertension-uncontrolled. Increase losartan to 50 mg daily.  Left great toenail abnormality-suspect that [name]'s had damage to the matrix. I don't think that it's a fungus. No sign of ingrown nail or infection. Continue to monitor carefully.

## 2016-08-18 NOTE — Patient Instructions (Signed)
Increase losartan to 50 mg daily. If no improvement in swelling after 4-5 days them please let me know. Also recommend that you take furosemide this afternoon to try to help alleviate some of the fluid buildup. Can take it again tomorrow if needed.

## 2016-08-19 ENCOUNTER — Telehealth (HOSPITAL_COMMUNITY): Payer: Self-pay | Admitting: Family Medicine

## 2016-08-19 LAB — VITAMIN D 25 HYDROXY (VIT D DEFICIENCY, FRACTURES): VIT D 25 HYDROXY: 27 ng/mL — AB (ref 30–100)

## 2016-08-19 LAB — COMPLETE METABOLIC PANEL WITH GFR
ALBUMIN: 4.1 g/dL (ref 3.6–5.1)
ALK PHOS: 75 U/L (ref 33–130)
ALT: 10 U/L (ref 6–29)
AST: 15 U/L (ref 10–35)
BUN: 33 mg/dL — AB (ref 7–25)
CALCIUM: 9.6 mg/dL (ref 8.6–10.4)
CO2: 27 mmol/L (ref 20–32)
CREATININE: 1.29 mg/dL — AB (ref 0.60–0.88)
Chloride: 101 mmol/L (ref 98–110)
GFR, EST AFRICAN AMERICAN: 45 mL/min — AB (ref >=60)
GFR, Est Non African American: 39 mL/min — ABNORMAL LOW (ref >=60)
Glucose, Bld: 96 mg/dL (ref 65–99)
Potassium: 4.1 mmol/L (ref 3.5–5.3)
SODIUM: 140 mmol/L (ref 135–146)
Total Bilirubin: 0.6 mg/dL (ref 0.2–1.2)
Total Protein: 6.4 g/dL (ref 6.1–8.1)

## 2016-08-19 LAB — BRAIN NATRIURETIC PEPTIDE: Brain Natriuretic Peptide: 185 pg/mL — ABNORMAL HIGH (ref <100)

## 2016-08-19 LAB — TSH: TSH: 2.95 mIU/L

## 2016-08-19 NOTE — Addendum Note (Signed)
Addended by: Beatrice Lecher D on: 08/19/2016 12:44 PM   Modules accepted: Orders

## 2016-08-21 NOTE — Telephone Encounter (Signed)
Close encounter 

## 2016-08-24 ENCOUNTER — Other Ambulatory Visit: Payer: Self-pay

## 2016-08-24 ENCOUNTER — Ambulatory Visit (HOSPITAL_COMMUNITY): Payer: Medicare Other | Attending: Cardiovascular Disease

## 2016-08-24 DIAGNOSIS — I351 Nonrheumatic aortic (valve) insufficiency: Secondary | ICD-10-CM | POA: Insufficient documentation

## 2016-08-24 DIAGNOSIS — R7989 Other specified abnormal findings of blood chemistry: Secondary | ICD-10-CM | POA: Diagnosis not present

## 2016-08-24 DIAGNOSIS — R600 Localized edema: Secondary | ICD-10-CM | POA: Diagnosis not present

## 2016-08-24 DIAGNOSIS — E785 Hyperlipidemia, unspecified: Secondary | ICD-10-CM | POA: Diagnosis not present

## 2016-08-24 DIAGNOSIS — I10 Essential (primary) hypertension: Secondary | ICD-10-CM

## 2016-08-26 DIAGNOSIS — H35353 Cystoid macular degeneration, bilateral: Secondary | ICD-10-CM | POA: Diagnosis not present

## 2016-08-31 ENCOUNTER — Other Ambulatory Visit: Payer: Self-pay | Admitting: Family Medicine

## 2016-09-01 ENCOUNTER — Ambulatory Visit (INDEPENDENT_AMBULATORY_CARE_PROVIDER_SITE_OTHER): Payer: Medicare Other | Admitting: Family Medicine

## 2016-09-01 VITALS — BP 138/82 | Wt 197.0 lb

## 2016-09-01 DIAGNOSIS — I10 Essential (primary) hypertension: Secondary | ICD-10-CM | POA: Diagnosis not present

## 2016-09-01 NOTE — Progress Notes (Signed)
Patient is here for BP and Wt check. Patient did state she was taking Losartan twice daily. She has 25mg  tablets so she is taking two 25 mg capsules to make 50 mg. Patient is still taking metoprolol and amlodipine as directed and has d/c furosemide. Also went over echocardiogram results with patient as she states she didn't understand what was told to her over the phone.

## 2016-09-02 NOTE — Progress Notes (Signed)
HTN - BP looks fantastic. When ss out of the 25 can switch to the 50 mg tabs that she doesn't have to take 2.she is down 3 pounds which is fantastic.check BMP at next office visit.

## 2016-09-03 DIAGNOSIS — M4802 Spinal stenosis, cervical region: Secondary | ICD-10-CM | POA: Diagnosis not present

## 2016-09-03 DIAGNOSIS — M48061 Spinal stenosis, lumbar region without neurogenic claudication: Secondary | ICD-10-CM | POA: Diagnosis not present

## 2016-09-03 DIAGNOSIS — S22000S Wedge compression fracture of unspecified thoracic vertebra, sequela: Secondary | ICD-10-CM | POA: Diagnosis not present

## 2016-09-03 DIAGNOSIS — M9983 Other biomechanical lesions of lumbar region: Secondary | ICD-10-CM | POA: Diagnosis not present

## 2016-09-16 DIAGNOSIS — H524 Presbyopia: Secondary | ICD-10-CM | POA: Diagnosis not present

## 2016-09-16 DIAGNOSIS — H35033 Hypertensive retinopathy, bilateral: Secondary | ICD-10-CM | POA: Diagnosis not present

## 2016-09-16 DIAGNOSIS — H02052 Trichiasis without entropian right lower eyelid: Secondary | ICD-10-CM | POA: Diagnosis not present

## 2016-09-16 DIAGNOSIS — H35363 Drusen (degenerative) of macula, bilateral: Secondary | ICD-10-CM | POA: Diagnosis not present

## 2016-09-21 ENCOUNTER — Other Ambulatory Visit: Payer: Self-pay | Admitting: Family Medicine

## 2016-10-07 ENCOUNTER — Other Ambulatory Visit: Payer: Self-pay | Admitting: Family Medicine

## 2016-10-15 ENCOUNTER — Ambulatory Visit (INDEPENDENT_AMBULATORY_CARE_PROVIDER_SITE_OTHER): Payer: Medicare Other | Admitting: Family Medicine

## 2016-10-15 ENCOUNTER — Encounter: Payer: Self-pay | Admitting: Family Medicine

## 2016-10-15 VITALS — BP 138/76 | HR 68 | Ht 63.0 in | Wt 200.0 lb

## 2016-10-15 DIAGNOSIS — Z23 Encounter for immunization: Secondary | ICD-10-CM | POA: Diagnosis not present

## 2016-10-15 DIAGNOSIS — K2100 Gastro-esophageal reflux disease with esophagitis, without bleeding: Secondary | ICD-10-CM

## 2016-10-15 DIAGNOSIS — I10 Essential (primary) hypertension: Secondary | ICD-10-CM | POA: Diagnosis not present

## 2016-10-15 DIAGNOSIS — R7301 Impaired fasting glucose: Secondary | ICD-10-CM | POA: Diagnosis not present

## 2016-10-15 LAB — POCT GLYCOSYLATED HEMOGLOBIN (HGB A1C): Hemoglobin A1C: 5.8

## 2016-10-15 MED ORDER — LOSARTAN POTASSIUM 50 MG PO TABS: 50.0000 mg | ORAL_TABLET | Freq: Every day | ORAL | 1 refills | Status: DC

## 2016-10-15 MED ORDER — DULOXETINE HCL 60 MG PO CPEP: 60.0000 mg | ORAL_CAPSULE | Freq: Every day | ORAL | 3 refills | Status: DC

## 2016-10-15 MED ORDER — SUCRALFATE 1 G PO TABS: 1.0000 g | ORAL_TABLET | Freq: Two times a day (BID) | ORAL | 3 refills | Status: DC

## 2016-10-15 MED ORDER — METOPROLOL TARTRATE 100 MG PO TABS: 100.0000 mg | ORAL_TABLET | Freq: Two times a day (BID) | ORAL | 3 refills | Status: DC

## 2016-10-15 NOTE — Progress Notes (Signed)
Subjective:    Patient ID: [name], female    DOB: 1933/01/03, 83 y.o.   MRN: [id number]  HPI Hypertension- Pt denies chest pain, SOB, dizziness, or heart palpitations.  Taking meds as directed w/o problems.  Denies medication side effects.  We had recently increased her losartan to 50 mg. She's also taking metoprolol and amlodipine. She had echocardiogram August 13. She had some mild thickening to the left ventricle be EF was normal. She still occasionally gets some looks very swelling but just takes her diuretic as needed.  She also says that she needs her ulcer medicine filled. She is currently on Carafate and uses it as needed. She said she just picked up the last refill prescription.  She also had some allergy symptoms more recently with some postnasal drip and little bit of congestion. Mostly just bothers her in the evenings and at night not as much during the day. She's not currently taking any medications but has used Flonase in the past.  Impaired fasting glucose-no increased thirst or urination. No symptoms consistent with hypoglycemia.   Review of Systems  BP 138/76   Pulse 68   Ht 5\' 3"  (1.6 m)   Wt 200 lb (90.7 kg)   SpO2 98%   BMI 35.43 kg/m     Allergies  Allergen Reactions  . Aspirin Other (See Comments)    ulcer  . Gabapentin Hives and Itching  . Oxycodone Other (See Comments)    Pt unsure  . Simvastatin Other (See Comments)    Lost the use of her legs  . Sulfonamide Derivatives Hives and Itching  . Amlodipine Other (See Comments)    Pedal edema  . Atorvastatin   . Azithromycin   . Beta Adrenergic Blockers   . Captopril   . Codeine   . Fluvastatin Sodium   . Hydrochlorothiazide Other (See Comments)    INcreaseed BUN/Creatinine  . Latex   . Lisinopril Other (See Comments)    Increased BUN/CR  . Lovastatin   . Macrolides And Ketolides   . Rosiglitazone     Past Medical History:  Diagnosis Date  . Adenocarcinoma ([place])    breast, right   . Anemia    iron deficiency- NOS  . Anxiety   . Atrial fibrillation ([place])    setting of pneumonia  . DDD (degenerative disc disease), lumbar    Dr Owens Shark  . Depression   . Diabetic retinopathy    Dr Jodi Mourning  . Discoid lupus   . Gastric ulcer   . Headache(784.0)    chronic  . Hemorrhoids   . Hyperlipidemia   . Hypertension    oral meds  . IBS (irritable bowel syndrome)   . Lumbar disc disease   . Lupus erythematosus    discoid  . Morton neuroma    right- Dr Wardell Honour  . Peripheral neuropathy   . PUD (peptic ulcer disease)   . Renal insufficiency     Past Surgical History:  Procedure Laterality Date  . ABDOMINAL HYSTERECTOMY     1 oophorectomy  . CHOLECYSTECTOMY  08/2005  . LUMBAR LAMINECTOMY    . right partial mastectomy w/sentinel lymph node biopsy  11-09  . TOTAL KNEE ARTHROPLASTY     both- Dr Devona Konig    Social History   Social History  . Marital status: Widowed    Spouse name: N/A  . Number of children: 3  . Years of education: N/A   Occupational History  . Retired  Social History Main Topics  . Smoking status: Never Smoker  . Smokeless tobacco: Never Used  . Alcohol use 0.6 oz/week    1 Glasses of wine per week     Comment: occasional  . Drug use: No  . Sexual activity: Not on file   Other Topics Concern  . Not on file   Social History Narrative  . No narrative on file    Family History  Problem Relation Age of Onset  . Diabetes Mother   . Angina Mother   . COPD Father        colon  . Diabetes Father   . Stroke Father   . Hyperlipidemia Father   . Colon cancer Father     Outpatient Encounter Prescriptions as of 10/15/2016  Medication Sig  . amLODipine (NORVASC) 2.5 MG tablet Take 1 tablet (2.5 mg total) by mouth daily.  Marland Kitchen aspirin EC 81 MG tablet Take 81 mg by mouth daily.  . Calcium Carbonate-Vitamin D (CALTRATE 600+D) 600-400 MG-UNIT per tablet Take 1 tablet by mouth 2 (two) times daily.   . cyanocobalamin 500 MCG tablet Take  1,000 mcg by mouth daily.   . DULoxetine (CYMBALTA) 60 MG capsule Take 1 capsule (60 mg total) by mouth daily. Will be due for refill next month  . furosemide (LASIX) 20 MG tablet Take 1 tablet (20 mg total) by mouth daily as needed for edema.  . hydroxychloroquine (PLAQUENIL) 200 MG tablet Take 200 mg by mouth daily. Take two  By mouth daily  . Ibuprofen 200 MG CAPS Take by mouth.  . losartan (COZAAR) 25 MG tablet Take 1 tablet (25 mg total) by mouth 2 (two) times daily.  . metoprolol tartrate (LOPRESSOR) 100 MG tablet Take 1 tablet (100 mg total) by mouth 2 (two) times daily.  Marland Kitchen oxybutynin (DITROPAN) 5 MG tablet Take 1 tablet (5 mg total) by mouth 2 (two) times daily.  . sucralfate (CARAFATE) 1 g tablet Take 1 tablet (1 g total) by mouth 2 (two) times daily.  . [DISCONTINUED] DULoxetine (CYMBALTA) 60 MG capsule Take 1 capsule (60 mg total) by mouth daily. Will be due for refill next month  . [DISCONTINUED] metoprolol (LOPRESSOR) 100 MG tablet Take 1 tablet (100 mg total) by mouth 2 (two) times daily.  . [DISCONTINUED] sucralfate (CARAFATE) 1 g tablet TAKE ONE TABLET TWICE DAILY  . [DISCONTINUED] ofloxacin (OCUFLOX) 0.3 % ophthalmic solution INSTILL ONE DROP IN THE LEFT EYE FOUR TIMES DAILY   No facility-administered encounter medications on file as of 10/15/2016.          Objective:   Physical Exam  Constitutional: She is oriented to person, place, and time. She appears well-developed and well-nourished.  HENT:  Head: Normocephalic and atraumatic.  Right Ear: External ear normal.  Left Ear: External ear normal.  Nose: Nose normal.  Mouth/Throat: Oropharynx is clear and moist.  TMs and canals are clear.   Eyes: Pupils are equal, round, and reactive to light. Conjunctivae and EOM are normal.  Neck: Neck supple. No thyromegaly present.  Cardiovascular: Normal rate, regular rhythm and normal heart sounds.   Pulmonary/Chest: Effort normal and breath sounds normal. She has no wheezes.   Musculoskeletal:  Trace ankle edema bilaterally  Lymphadenopathy:    She has no cervical adenopathy.  Neurological: She is alert and oriented to person, place, and time.  Skin: Skin is warm and dry.  Psychiatric: She has a normal mood and affect.  Assessment & Plan:  HTN  - Well-controlled today. Into new current regimen.  GERD-refilled Carafate today.  Allergic rhinitis-currently symptoms are mild. Certainly if they worsen she might want to restart her Flonase but if they're mild she certainly does not have to use anything for it.  IFG - stable. Continue to monitor. Follow-up 6 months. Lab Results  Component Value Date   HGBA1C 5.8 10/15/2016     Flu vaccine given.  Handicap form completed.

## 2016-11-10 DIAGNOSIS — H35353 Cystoid macular degeneration, bilateral: Secondary | ICD-10-CM | POA: Diagnosis not present

## 2016-11-10 DIAGNOSIS — H353221 Exudative age-related macular degeneration, left eye, with active choroidal neovascularization: Secondary | ICD-10-CM | POA: Diagnosis not present

## 2016-11-10 DIAGNOSIS — E119 Type 2 diabetes mellitus without complications: Secondary | ICD-10-CM | POA: Diagnosis not present

## 2016-11-10 DIAGNOSIS — H3092 Unspecified chorioretinal inflammation, left eye: Secondary | ICD-10-CM | POA: Diagnosis not present

## 2016-11-10 DIAGNOSIS — H353111 Nonexudative age-related macular degeneration, right eye, early dry stage: Secondary | ICD-10-CM | POA: Diagnosis not present

## 2016-12-30 ENCOUNTER — Emergency Department
Admission: EM | Admit: 2016-12-30 | Discharge: 2016-12-30 | Disposition: A | Discharge status: Home / Self Care | Payer: Medicare Other | Source: Home / Self Care | Attending: Family Medicine | Admitting: Family Medicine

## 2016-12-30 ENCOUNTER — Encounter: Payer: Self-pay | Admitting: Emergency Medicine

## 2016-12-30 DIAGNOSIS — S0003XA Contusion of scalp, initial encounter: Secondary | ICD-10-CM

## 2016-12-30 DIAGNOSIS — N3000 Acute cystitis without hematuria: Secondary | ICD-10-CM

## 2016-12-30 LAB — POCT URINALYSIS DIP (MANUAL ENTRY)
Bilirubin, UA: NEGATIVE
Glucose, UA: NEGATIVE mg/dL
Nitrite, UA: POSITIVE — AB
SPEC GRAV UA: 1.025 (ref 1.010–1.025)
UROBILINOGEN UA: 0.2 U/dL
pH, UA: 5.5 (ref 5.0–8.0)

## 2016-12-30 MED ORDER — CEPHALEXIN 500 MG PO CAPS: 500.0000 mg | ORAL_CAPSULE | Freq: Two times a day (BID) | ORAL | 0 refills | Status: DC

## 2016-12-30 NOTE — ED Triage Notes (Signed)
Pt c/o urinary frequency and urgency that started today.

## 2016-12-30 NOTE — ED Provider Notes (Signed)
[facility]    CSN: [id number] Arrival date & time: 12/30/16  1433     History   Chief Complaint Chief Complaint  Patient presents with  . Urinary Frequency    HPI [name] is a 83 y.o. female.   Patient reports that she was awakened suddenly last night by urinary urgency.  After climbing out of bed she stumbled and fell, striking her posterior head but did not lose consciousness.  She continues to have urinary urgency and dysuria today.  She denies headache or neurologic symptoms.  No fevers, chills, and sweats.   The history is provided by the patient and a relative.  Dysuria  Pain quality:  Burning Pain severity:  Mild Onset quality:  Sudden Duration:  12 hours Timing:  Constant Progression:  Unchanged Chronicity:  New Recent urinary tract infections: no   Relieved by:  None tried Worsened by:  Nothing Ineffective treatments:  None tried Urinary symptoms: frequent urination and hesitancy   Urinary symptoms: no discolored urine, no foul-smelling urine, no hematuria and no bladder incontinence   Associated symptoms: no abdominal pain, no fever, no flank pain, no nausea, no vaginal discharge and no vomiting     Past Medical History:  Diagnosis Date  . Adenocarcinoma ([place])    breast, right  . Anemia    iron deficiency- NOS  . Anxiety   . Atrial fibrillation ([place])    setting of pneumonia  . DDD (degenerative disc disease), lumbar    Dr Owens Shark  . Depression   . Diabetic retinopathy    Dr Jodi Mourning  . Discoid lupus   . Gastric ulcer   . Headache(784.0)    chronic  . Hemorrhoids   . Hyperlipidemia   . Hypertension    oral meds  . IBS (irritable bowel syndrome)   . Lumbar disc disease   . Lupus erythematosus    discoid  . Morton neuroma    right- Dr Wardell Honour  . Peripheral neuropathy   . PUD (peptic ulcer disease)   . Renal insufficiency     Patient Active Problem List   Diagnosis Date Noted  . Obesity (BMI 30-39.9) 11/29/2012  .  Edema 09/07/2012  . Peripheral neuropathy 05/26/2012  . Obesity 01/16/2011  . Discitis 08/06/2010  . MITRAL VALVE DISORDERS 03/12/2010  . HEART MURMUR, HX OF 03/11/2010  . FIBRILLATION, ATRIAL 02/28/2010  . MUSCLE SPASM, BACK 02/28/2010  . ANXIETY STATE, UNSPECIFIED 09/04/2009  . LUPUS ERYTHEMATOSUS, DISCOID 03/28/2008  . IRRITABLE BOWEL SYNDROME 11/29/2007  . ADENOCARCINOMA, BREAST, RIGHT 11/11/2007  . IMPAIRED FASTING GLUCOSE 06/01/2007  . HEMORRHOIDS 05/28/2007  . GASTRIC ULCER 05/28/2007  . HEEL SPUR 05/20/2007  . CAROTID ARTERY STENOSIS 01/27/2007  . HEADACHE, CHRONIC 01/27/2007  . RENAL FAILURE, CHRONIC 09/15/2006  . [place] DISEASE, LUMBAR 04/27/2006  . ANEMIA, IRON DEFICIENCY NOS 04/16/2006  . FISSURE, ANAL 03/30/2006  . Hyperlipidemia 03/12/2006  . Depression, major, in remission ([place]) 03/12/2006  . HYPERTENSION, BENIGN ESSENTIAL 03/12/2006    Past Surgical History:  Procedure Laterality Date  . ABDOMINAL HYSTERECTOMY     1 oophorectomy  . CHOLECYSTECTOMY  08/2005  . LUMBAR LAMINECTOMY    . right partial mastectomy w/sentinel lymph node biopsy  11-09  . TOTAL KNEE ARTHROPLASTY     both- Dr Devona Konig    OB History    No data available       Home Medications    Prior to Admission medications   Medication Sig Start Date End Date Taking?  Authorizing Provider  amLODipine (NORVASC) 2.5 MG tablet Take 1 tablet (2.5 mg total) by mouth daily. 04/15/16   Hali Marry, MD  aspirin EC 81 MG tablet Take 81 mg by mouth daily.    [provider]  Calcium Carbonate-Vitamin D (CALTRATE 600+D) 600-400 MG-UNIT per tablet Take 1 tablet by mouth 2 (two) times daily.     [provider]  cephALEXin (KEFLEX) 500 MG capsule Take 1 capsule (500 mg total) by mouth 2 (two) times daily. 12/30/16   Kandra Nicolas, MD  cyanocobalamin 500 MCG tablet Take 1,000 mcg by mouth daily.     [provider]  DULoxetine (CYMBALTA) 60 MG capsule Take 1 capsule (60  mg total) by mouth daily. Will be due for refill next month 10/15/16   Hali Marry, MD  furosemide (LASIX) 20 MG tablet Take 1 tablet (20 mg total) by mouth daily as needed for edema. 08/18/16   Hali Marry, MD  hydroxychloroquine (PLAQUENIL) 200 MG tablet Take 200 mg by mouth daily. Take two  By mouth daily    [provider]  Ibuprofen 200 MG CAPS Take by mouth.    [provider]  losartan (COZAAR) 50 MG tablet Take 1 tablet (50 mg total) by mouth daily. 10/15/16   Hali Marry, MD  metoprolol tartrate (LOPRESSOR) 100 MG tablet Take 1 tablet (100 mg total) by mouth 2 (two) times daily. 10/15/16   Hali Marry, MD  oxybutynin (DITROPAN) 5 MG tablet Take 1 tablet (5 mg total) by mouth 2 (two) times daily. 07/27/16   Hali Marry, MD  sucralfate (CARAFATE) 1 g tablet Take 1 tablet (1 g total) by mouth 2 (two) times daily. 10/15/16   Hali Marry, MD    Family History Family History  Problem Relation Age of Onset  . Diabetes Mother   . Angina Mother   . COPD Father        colon  . Diabetes Father   . Stroke Father   . Hyperlipidemia Father   . Colon cancer Father     Social History Social History   Tobacco Use  . Smoking status: Never Smoker  . Smokeless tobacco: Never Used  Substance Use Topics  . Alcohol use: Yes    Alcohol/week: 0.6 oz    Types: 1 Glasses of wine per week    Comment: occasional  . Drug use: No     Allergies   Aspirin; Gabapentin; Oxycodone; Simvastatin; Sulfonamide derivatives; Amlodipine; Atorvastatin; Azithromycin; Beta adrenergic blockers; Captopril; Codeine; Fluvastatin sodium; Hydrochlorothiazide; Latex; Lisinopril; Lovastatin; Macrolides and ketolides; and Rosiglitazone   Review of Systems Review of Systems  Constitutional: Negative.  Negative for fever.  Gastrointestinal: Negative for abdominal pain, nausea and vomiting.  Genitourinary: Positive for dysuria, frequency and  urgency. Negative for flank pain, hematuria and vaginal discharge.  Neurological: Negative for dizziness, tremors, seizures, facial asymmetry, speech difficulty, weakness, light-headedness, numbness and headaches.  All other systems reviewed and are negative.    Physical Exam Triage Vital Signs ED Triage Vitals  Enc Vitals Group     BP 12/30/16 1615 (!) 180/90     Pulse --      Resp --      Temp 12/30/16 1615 97.6 F (36.4 C)     Temp Source 12/30/16 1615 Oral     SpO2 --      Weight --      Height --      Head Circumference --  Peak Flow --      Pain Score 12/30/16 1617 0     Pain Loc --      Pain Edu? --      Excl. in [place]? --    No data found.  Updated Vital Signs BP (!) 180/90 (BP Location: Right Arm)   Temp 97.6 F (36.4 C) (Oral)   Visual Acuity Right Eye Distance:   Left Eye Distance:   Bilateral Distance:    Right Eye Near:   Left Eye Near:    Bilateral Near:     Physical Exam Nursing notes and Vital Signs reviewed. Appearance:  Patient appears stated age, and in no acute distress.   She is alert and oriented.  Head:  2cm diameter hematoma left occipital area;  No bony step-off palpated. Eyes:  Pupils are equal, round, and reactive to light and accomodation.  Extraocular movement is intact.  Conjunctivae are not inflamed  Mouth:  Tongue midline.  Pharynx:  Normal; moist mucous membranes  Neck:  Supple.  No adenopathy Lungs:  Clear to auscultation.  Breath sounds are equal.  Moving air well. Heart:  Regular rate and rhythm without murmurs, rubs, or gallops.  Abdomen:  Nontender without masses or hepatosplenomegaly.  Bowel sounds are present.  No CVA or flank tenderness.  Extremities:  No edema.  Skin:  No rash present.  Neurologic:  Cranial nerves 2 through 12 are normal.   UC Treatments / Results  Labs (all labs ordered are listed, but only abnormal results are displayed) Labs Reviewed  POCT URINALYSIS DIP (MANUAL ENTRY) - Abnormal; Notable for  the following components:      Result Value   Clarity, UA cloudy (*)    Ketones, POC UA trace (5) (*)    Blood, UA trace-intact (*)    Protein Ur, POC trace (*)    Nitrite, UA Positive (*)    Leukocytes, UA Trace (*)    All other components within normal limits  URINE CULTURE    EKG  EKG Interpretation None       Radiology No results found.  Procedures Procedures (including critical care time)  Medications Ordered in UC Medications - No data to display   Initial Impression / Assessment and Plan / UC Course  I have reviewed the triage vital signs and the nursing notes.  Pertinent labs & imaging results that were available during my care of the patient were reviewed by me and considered in my medical decision making (see chart for details).    Urine culture pending.  Begin Keflex. Increase fluid intake. May use non-prescription AZO for about two days, if desired, to decrease urinary discomfort.   Apply ice to the affected area on your scalp. ? Put ice in a plastic bag. ? Place a towel between your skin and the bag. ? Leave the ice on for 15 minutes, 2-3 times a day for the first couple of days.   If symptoms become significantly worse during the night or over the weekend, proceed to the local emergency room.  Discussed head injury precautions. Followup with Family Doctor if not improved in about 5 days.    Final Clinical Impressions(s) / UC Diagnoses   Final diagnoses:  Acute cystitis without hematuria  Hematoma of scalp, initial encounter    ED Discharge Orders        Ordered    cephALEXin (KEFLEX) 500 MG capsule  2 times daily     12/30/16 1640  Kandra Nicolas, MD 12/31/16 2211

## 2016-12-30 NOTE — Discharge Instructions (Signed)
Increase fluid intake. May use non-prescription AZO for about two days, if desired, to decrease urinary discomfort.  Apply ice to the affected area on your scalp. Put ice in a plastic bag. Place a towel between your skin and the bag. Leave the ice on for 15 minutes, 2-3 times a day for the first couple of days. If symptoms become significantly worse during the night or over the weekend, proceed to the local emergency room.

## 2016-12-31 LAB — URINE CULTURE
MICRO NUMBER:: 81428642
SPECIMEN QUALITY: ADEQUATE

## 2017-01-01 ENCOUNTER — Telehealth: Payer: Self-pay | Admitting: Emergency Medicine

## 2017-01-01 NOTE — Telephone Encounter (Signed)
Spoke with patient and told her results from urine culture negative but complete rx; patient states she is feeling better.

## 2017-01-25 ENCOUNTER — Other Ambulatory Visit: Payer: Self-pay | Admitting: Family Medicine

## 2017-02-02 DIAGNOSIS — L932 Other local lupus erythematosus: Secondary | ICD-10-CM | POA: Diagnosis not present

## 2017-02-02 DIAGNOSIS — E119 Type 2 diabetes mellitus without complications: Secondary | ICD-10-CM | POA: Diagnosis not present

## 2017-02-02 DIAGNOSIS — M810 Age-related osteoporosis without current pathological fracture: Secondary | ICD-10-CM | POA: Diagnosis not present

## 2017-02-02 DIAGNOSIS — I10 Essential (primary) hypertension: Secondary | ICD-10-CM | POA: Diagnosis not present

## 2017-02-02 DIAGNOSIS — M159 Polyosteoarthritis, unspecified: Secondary | ICD-10-CM | POA: Diagnosis not present

## 2017-02-03 ENCOUNTER — Other Ambulatory Visit: Payer: Self-pay | Admitting: *Deleted

## 2017-02-03 ENCOUNTER — Other Ambulatory Visit: Payer: Self-pay | Admitting: Family Medicine

## 2017-04-06 ENCOUNTER — Encounter: Payer: Self-pay | Admitting: Physician Assistant

## 2017-04-06 ENCOUNTER — Ambulatory Visit (INDEPENDENT_AMBULATORY_CARE_PROVIDER_SITE_OTHER): Payer: Medicare Other | Admitting: Physician Assistant

## 2017-04-06 VITALS — BP 137/73 | HR 70 | Ht 63.0 in | Wt 199.0 lb

## 2017-04-06 DIAGNOSIS — M545 Low back pain, unspecified: Secondary | ICD-10-CM

## 2017-04-06 DIAGNOSIS — R350 Frequency of micturition: Secondary | ICD-10-CM

## 2017-04-06 DIAGNOSIS — R109 Unspecified abdominal pain: Secondary | ICD-10-CM

## 2017-04-06 DIAGNOSIS — Z9181 History of falling: Secondary | ICD-10-CM

## 2017-04-06 LAB — POCT URINALYSIS DIPSTICK
Bilirubin, UA: NEGATIVE
GLUCOSE UA: NEGATIVE
Ketones, UA: NEGATIVE
LEUKOCYTES UA: NEGATIVE
NITRITE UA: NEGATIVE
Urobilinogen, UA: 0.2 E.U./dL
pH, UA: 5.5 (ref 5.0–8.0)

## 2017-04-06 MED ORDER — KETOROLAC TROMETHAMINE 30 MG/ML IJ SOLN
30.0000 mg | Freq: Once | INTRAMUSCULAR | Status: AC
  Administered 2017-04-06: 30 mg via INTRAMUSCULAR

## 2017-04-06 NOTE — Progress Notes (Signed)
Subjective:    Patient ID: [name], female    DOB: 06/25/1932, 84 y.o.   MRN: [id number]  HPI  Pt is a 84 yo female with HTN, hx of PUD, lupus who presents to the clinic with increased urinary frequency and left low back pain and left flank pain. Her last UTI was 12/30/16. She denies any fever, chills, dysuria, nausea, SOB. She admits she has been twisting a lot in her apartment moving things around. She has not fallen since December 2018. In December she had a problem that she was unable to pull foot off the gas. Her low back pain is not radiating into her leg at this time.   .. Active Ambulatory Problems    Diagnosis Date Noted  . ADENOCARCINOMA, BREAST, RIGHT 11/11/2007  . Hyperlipidemia 03/12/2006  . ANEMIA, IRON DEFICIENCY NOS 04/16/2006  . ANXIETY STATE, UNSPECIFIED 09/04/2009  . Depression, major, in remission ([place]) 03/12/2006  . HYPERTENSION, BENIGN ESSENTIAL 03/12/2006  . CAROTID ARTERY STENOSIS 01/27/2007  . HEMORRHOIDS 05/28/2007  . GASTRIC ULCER 05/28/2007  . IRRITABLE BOWEL SYNDROME 11/29/2007  . FISSURE, ANAL 03/30/2006  . RENAL FAILURE, CHRONIC 09/15/2006  . [place] DISEASE, LUMBAR 04/27/2006  . HEEL SPUR 05/20/2007  . HEADACHE, CHRONIC 01/27/2007  . IMPAIRED FASTING GLUCOSE 06/01/2007  . FIBRILLATION, ATRIAL 02/28/2010  . LUPUS ERYTHEMATOSUS, DISCOID 03/28/2008  . MUSCLE SPASM, BACK 02/28/2010  . MITRAL VALVE DISORDERS 03/12/2010  . HEART MURMUR, HX OF 03/11/2010  . Discitis 08/06/2010  . Obesity 01/16/2011  . Peripheral neuropathy 05/26/2012  . Edema 09/07/2012  . Obesity (BMI 30-39.9) 11/29/2012   Resolved Ambulatory Problems    Diagnosis Date Noted  . PERIPHERAL NEUROPATHY 11/29/2007  . RENAL INSUFFICIENCY 05/28/2007  . PNEUMONIA 02/28/2010  . Needs flu shot 09/18/2015   Past Medical History:  Diagnosis Date  . Adenocarcinoma ([place])   . Anemia   . Anxiety   . Atrial fibrillation ([place])   . DDD (degenerative disc disease), lumbar   .  Depression   . Diabetic retinopathy   . Discoid lupus   . Gastric ulcer   . Headache(784.0)   . Hemorrhoids   . Hyperlipidemia   . Hypertension   . IBS (irritable bowel syndrome)   . Lumbar disc disease   . Lupus erythematosus   . Morton neuroma   . Peripheral neuropathy   . PUD (peptic ulcer disease)   . Renal insufficiency      Review of Systems See HPI.     Objective:   Physical Exam  Constitutional: She is oriented to person, place, and time.  HENT:  Head: Normocephalic and atraumatic.  Cardiovascular: Normal rate and regular rhythm.  3/6 SEM right 2nd intercostal space.  No bruits.   Pulmonary/Chest: Effort normal and breath sounds normal. She has no wheezes.  CVA tenderness.   Abdominal: Soft. Bowel sounds are normal. She exhibits no distension and no mass. There is no tenderness. There is no rebound and no guarding.  Musculoskeletal:  Left low back pain to palpation over the lumbar paraspinal muscles.  No pain over lumbar spine.   Neurological: She is alert and oriented to person, place, and time.  Skin: Skin is dry.  Psychiatric: She has a normal mood and affect. Her behavior is normal.          Assessment & Plan:  Marland KitchenMarland KitchenDiagnoses and all orders for this visit:  Acute left-sided low back pain without sciatica -     Urine Culture -  ketorolac (TORADOL) 30 MG/ML injection 30 mg  Urinary frequency -     POCT urinalysis dipstick -     Urine Culture  Flank pain -     POCT urinalysis dipstick   .Marland Kitchen Results for orders placed or performed in visit on 04/06/17  POCT urinalysis dipstick  Result Value Ref Range   Color, UA Dark Yellow    Clarity, UA Slightly Cloudy    Glucose, UA Negative    Bilirubin, UA Negative    Ketones, UA Negative    Spec Grav, UA >=1.030 (A) 1.010 - 1.025   Blood, UA Small    pH, UA 5.5 5.0 - 8.0   Protein, UA >=300    Urobilinogen, UA 0.2 0.2 or 1.0 E.U./dL   Nitrite, UA Negative    Leukocytes, UA Negative Negative    Appearance     Odor     No overt signs of infection. Will culture to confirm. I think low back pain is musculoskeletal strain. Toradol 30mg  IM given today. Discussed heat, biofreeze, tylenol, stretches. Consider rest and tens units if not improving. No NSAIDs. Follow up with PCP concerning falls and ongoing low back pain.

## 2017-04-06 NOTE — Patient Instructions (Signed)
Tens units if not improving.     Lumbosacral Strain Lumbosacral strain is an injury that causes pain in the lower back (lumbosacral spine). This injury usually occurs from overstretching the muscles or ligaments along your spine. A strain can affect one or more muscles or cord-like tissues that connect bones to other bones (ligaments). What are the causes? This condition may be caused by:  A hard, direct hit (blow) to the back.  Excessive stretching of the lower back muscles. This may result from: ? A fall. ? Lifting something heavy. ? Repetitive movements such as bending or crouching.  What increases the risk? The following factors may increase your risk of getting this condition:  Participating in sports or activities that involve: ? A sudden twist of the back. ? Pushing or pulling motions.  Being overweight or obese.  Having poor strength and flexibility, especially tight hamstrings or weak muscles in the back or abdomen.  Having too much of a curve in the lower back.  Having a pelvis that is tilted forward.  What are the signs or symptoms? The main symptom of this condition is pain in the lower back, at the site of the strain. Pain may extend (radiate) down one or both legs. How is this diagnosed? This condition is diagnosed based on:  Your symptoms.  Your medical history.  A physical exam. ? Your health care provider may push on certain areas of your back to determine the source of your pain. ? You may be asked to bend forward, backward, and side to side to assess the severity of your pain and your range of motion.  Imaging tests, such as: ? X-rays. ? MRI.  How is this treated? Treatment for this condition may include:  Putting heat and cold on the affected area.  Medicines to help relieve pain and relax your muscles (muscle relaxants).  NSAIDs to help reduce swelling and discomfort.  When your symptoms improve, it is important to gradually return to your  normal routine as soon as possible to reduce pain, avoid stiffness, and avoid loss of muscle strength. Generally, symptoms should improve within 6 weeks of treatment. However, recovery time varies. Follow these instructions at home: Managing pain, stiffness, and swelling   If directed, put ice on the injured area during the first 24 hours after your strain. ? Put ice in a plastic bag. ? Place a towel between your skin and the bag. ? Leave the ice on for 20 minutes, 2-3 times a day.  If directed, put heat on the affected area as often as told by your health care provider. Use the heat source that your health care provider recommends, such as a moist heat pack or a heating pad. ? Place a towel between your skin and the heat source. ? Leave the heat on for 20-30 minutes. ? Remove the heat if your skin turns bright red. This is especially important if you are unable to feel pain, heat, or cold. You may have a greater risk of getting burned. Activity  Rest and return to your normal activities as told by your health care provider. Ask your health care provider what activities are safe for you.  Avoid activities that take a lot of energy for as long as told by your health care provider. General instructions  Take over-the-counter and prescription medicines only as told by your health care provider.  Donot drive or use heavy machinery while taking prescription pain medicine.  Do not use any products that  contain nicotine or tobacco, such as cigarettes and e-cigarettes. If you need help quitting, ask your health care provider.  Keep all follow-up visits as told by your health care provider. This is important. How is this prevented?  Use correct form when playing sports and lifting heavy objects.  Use good posture when sitting and standing.  Maintain a healthy weight.  Sleep on a mattress with medium firmness to support your back.  Be safe and responsible while being active to avoid  falls.  Do at least 150 minutes of moderate-intensity exercise each week, such as brisk walking or water aerobics. Try a form of exercise that takes stress off your back, such as swimming or stationary cycling.  Maintain physical fitness, including: ? Strength. ? Flexibility. ? Cardiovascular fitness. ? Endurance. Contact a health care provider if:  Your back pain does not improve after 6 weeks of treatment.  Your symptoms get worse. Get help right away if:  Your back pain is severe.  You cannot stand or walk.  You have difficulty controlling when you urinate or when you have a bowel movement.  You feel nauseous or you vomit.  Your feet get very cold.  You have numbness, tingling, weakness, or problems using your arms or legs.  You develop any of the following: ? Shortness of breath. ? Dizziness. ? Pain in your legs. ? Weakness in your buttocks or legs. ? Discoloration of the skin on your toes or legs. This information is not intended to replace advice given to you by your health care provider. Make sure you discuss any questions you have with your health care provider. Document Released: 10/08/2004 Document Revised: 07/19/2015 Document Reviewed: 06/02/2015 Elsevier Interactive Patient Education  [employer].

## 2017-04-07 DIAGNOSIS — M545 Low back pain, unspecified: Secondary | ICD-10-CM | POA: Insufficient documentation

## 2017-04-07 DIAGNOSIS — Z9181 History of falling: Secondary | ICD-10-CM | POA: Insufficient documentation

## 2017-04-07 LAB — URINE CULTURE
MICRO NUMBER:: 90377189
SPECIMEN QUALITY:: ADEQUATE

## 2017-04-07 NOTE — Progress Notes (Signed)
Call pt: Let her know urine culture was negative for bacteria. Some contamination seen but no infection.

## 2017-04-15 ENCOUNTER — Encounter: Payer: Self-pay | Admitting: Family Medicine

## 2017-04-15 ENCOUNTER — Ambulatory Visit (INDEPENDENT_AMBULATORY_CARE_PROVIDER_SITE_OTHER): Payer: Medicare Other | Admitting: Family Medicine

## 2017-04-15 ENCOUNTER — Ambulatory Visit (INDEPENDENT_AMBULATORY_CARE_PROVIDER_SITE_OTHER): Payer: Medicare Other

## 2017-04-15 VITALS — BP 140/57 | HR 73 | Ht 63.0 in | Wt 196.0 lb

## 2017-04-15 DIAGNOSIS — T148XXA Other injury of unspecified body region, initial encounter: Secondary | ICD-10-CM | POA: Diagnosis not present

## 2017-04-15 DIAGNOSIS — I700 Atherosclerosis of aorta: Secondary | ICD-10-CM | POA: Diagnosis not present

## 2017-04-15 DIAGNOSIS — I10 Essential (primary) hypertension: Secondary | ICD-10-CM | POA: Diagnosis not present

## 2017-04-15 DIAGNOSIS — M545 Low back pain, unspecified: Secondary | ICD-10-CM

## 2017-04-15 DIAGNOSIS — L930 Discoid lupus erythematosus: Secondary | ICD-10-CM | POA: Diagnosis not present

## 2017-04-15 DIAGNOSIS — R7301 Impaired fasting glucose: Secondary | ICD-10-CM

## 2017-04-15 DIAGNOSIS — N183 Chronic kidney disease, stage 3 unspecified: Secondary | ICD-10-CM

## 2017-04-15 DIAGNOSIS — W19XXXA Unspecified fall, initial encounter: Secondary | ICD-10-CM | POA: Diagnosis not present

## 2017-04-15 DIAGNOSIS — M4854XA Collapsed vertebra, not elsewhere classified, thoracic region, initial encounter for fracture: Secondary | ICD-10-CM

## 2017-04-15 DIAGNOSIS — M4316 Spondylolisthesis, lumbar region: Secondary | ICD-10-CM

## 2017-04-15 LAB — POCT GLYCOSYLATED HEMOGLOBIN (HGB A1C): Hemoglobin A1C: 5.6

## 2017-04-15 NOTE — Progress Notes (Signed)
Subjective:    CC: Bp and glucose  HPI:  Hypertension- Pt denies chest pain, SOB, dizziness, or heart palpitations.  Taking meds as directed w/o problems.  Denies medication side effects.    Impaired fasting glucose-no increased thirst or urination. No symptoms consistent with hypoglycemia.  Also c/oleft low back pain.  She was actually seen about a week ago for these symptoms by 1 of my partners.  It was felt to be musculoskeletal at that time.  She had had some increased urinary frequency as well but urinalysis and culture was negative.  She says it still feels tender and sore. She was given a dose of IM Toradol and thath elped for 2 days.  But now the pain is back. She was told to avoid NSAIDs bc of her renal function but she has been taking a little IBU.  She says before Christmas she passed about twice about an hour apart and fell. She had to call EMS both times to help her up that is when she hist her right knee. She has had a knee replacement.  She still has some bruising near her right knee from when she fell at Christmas.it is healing but still tender.   She got a letter from her Rheumatologist that they were no longer taking his insurance so she went to a Rheum in [place] and they increased her Plaquenil to 200mg . She says it caused her to feel sleepy and sedated so went back down to 100mg .  She then got a call form previous Rheum and they said the letter went out by mistake.   Past medical history, Surgical history, Family history not pertinant except as noted below, Social history, Allergies, and medications have been entered into the medical record, reviewed, and corrections made.   Review of Systems: No fevers, chills, night sweats, weight loss, chest pain, or shortness of breath.   Hx of lumbar laminectomy.    Objective:    General: Well Developed, well nourished, and in no acute distress.  Neuro: Alert and oriented x3, extra-ocular muscles intact, sensation grossly intact.  HEENT:  Normocephalic, atraumatic  Skin: Warm and dry, no rashes. Cardiac: Regular rate and rhythm, no murmurs rubs or gallops, no lower extremity edema.  Respiratory: Clear to auscultation bilaterally. Not using accessory muscles, speaking in full sentences.   Impression and Recommendations:    HTN - BP borderline today.  Will monitor.   IFG -well controlled.  Hemoglobin A1c of 5.6 which is fantastic.  Continue current regimen.  Follow-up in 6 months.  Low back pain - left. She would benefit from PT> she doesn't drive anymore after she had an incidient where she suddenly couldn't move her right leg to hit the brake several months ago.  She would consider Home PT. Will start with xray since she did have 2 falls around Christmas.  Start using heat or ICE.    Right knee hematoma- is has likelhy calcified now and will likely have some permanent discoloration and firmness.   SLE - now back with her original rheum and she is happy about that.

## 2017-04-16 ENCOUNTER — Other Ambulatory Visit: Payer: Self-pay | Admitting: *Deleted

## 2017-04-16 DIAGNOSIS — R29898 Other symptoms and signs involving the musculoskeletal system: Secondary | ICD-10-CM

## 2017-04-16 MED ORDER — AMBULATORY NON FORMULARY MEDICATION: 0 refills | Status: AC

## 2017-04-26 DIAGNOSIS — M48061 Spinal stenosis, lumbar region without neurogenic claudication: Secondary | ICD-10-CM | POA: Diagnosis not present

## 2017-04-26 DIAGNOSIS — E109 Type 1 diabetes mellitus without complications: Secondary | ICD-10-CM | POA: Diagnosis not present

## 2017-04-26 DIAGNOSIS — M519 Unspecified thoracic, thoracolumbar and lumbosacral intervertebral disc disorder: Secondary | ICD-10-CM | POA: Diagnosis not present

## 2017-04-26 DIAGNOSIS — I10 Essential (primary) hypertension: Secondary | ICD-10-CM | POA: Diagnosis not present

## 2017-04-26 DIAGNOSIS — R0902 Hypoxemia: Secondary | ICD-10-CM | POA: Diagnosis not present

## 2017-04-29 DIAGNOSIS — M48061 Spinal stenosis, lumbar region without neurogenic claudication: Secondary | ICD-10-CM | POA: Diagnosis not present

## 2017-04-29 DIAGNOSIS — M9983 Other biomechanical lesions of lumbar region: Secondary | ICD-10-CM | POA: Diagnosis not present

## 2017-04-29 DIAGNOSIS — S22000S Wedge compression fracture of unspecified thoracic vertebra, sequela: Secondary | ICD-10-CM | POA: Diagnosis not present

## 2017-04-29 DIAGNOSIS — M4802 Spinal stenosis, cervical region: Secondary | ICD-10-CM | POA: Diagnosis not present

## 2017-05-05 DIAGNOSIS — Z08 Encounter for follow-up examination after completed treatment for malignant neoplasm: Secondary | ICD-10-CM | POA: Diagnosis not present

## 2017-05-05 DIAGNOSIS — Z853 Personal history of malignant neoplasm of breast: Secondary | ICD-10-CM | POA: Diagnosis not present

## 2017-05-05 DIAGNOSIS — Z1231 Encounter for screening mammogram for malignant neoplasm of breast: Secondary | ICD-10-CM | POA: Diagnosis not present

## 2017-05-05 DIAGNOSIS — I10 Essential (primary) hypertension: Secondary | ICD-10-CM | POA: Diagnosis not present

## 2017-05-05 DIAGNOSIS — C50011 Malignant neoplasm of nipple and areola, right female breast: Secondary | ICD-10-CM | POA: Diagnosis not present

## 2017-05-05 DIAGNOSIS — Z171 Estrogen receptor negative status [ER-]: Secondary | ICD-10-CM | POA: Diagnosis not present

## 2017-05-05 DIAGNOSIS — M3219 Other organ or system involvement in systemic lupus erythematosus: Secondary | ICD-10-CM | POA: Diagnosis not present

## 2017-05-05 DIAGNOSIS — Z9189 Other specified personal risk factors, not elsewhere classified: Secondary | ICD-10-CM | POA: Diagnosis not present

## 2017-05-06 ENCOUNTER — Telehealth: Payer: Self-pay

## 2017-05-06 ENCOUNTER — Other Ambulatory Visit: Payer: Self-pay | Admitting: Family Medicine

## 2017-05-06 DIAGNOSIS — R809 Proteinuria, unspecified: Secondary | ICD-10-CM

## 2017-05-06 NOTE — Telephone Encounter (Signed)
OK, her BP looked great when she was here 3 weeks ago so will monitor. WE can work up the 2+ protein.  Have her go to lab for Urine microalbumin and regular UA with micro and culture.

## 2017-05-06 NOTE — Telephone Encounter (Signed)
[employer] nurse called: she was doing a house call with [name]. [name]'s blood pressure was elevated 170/90 and she had 2 + protein in urine.

## 2017-05-07 DIAGNOSIS — I10 Essential (primary) hypertension: Secondary | ICD-10-CM | POA: Diagnosis not present

## 2017-05-07 DIAGNOSIS — R7301 Impaired fasting glucose: Secondary | ICD-10-CM | POA: Diagnosis not present

## 2017-05-07 DIAGNOSIS — R809 Proteinuria, unspecified: Secondary | ICD-10-CM | POA: Diagnosis not present

## 2017-05-07 LAB — COMPLETE METABOLIC PANEL WITH GFR
AG Ratio: 1.8 (calc) (ref 1.0–2.5)
ALT: 13 U/L (ref 6–29)
AST: 19 U/L (ref 10–35)
Albumin: 4.3 g/dL (ref 3.6–5.1)
Alkaline phosphatase (APISO): 81 U/L (ref 33–130)
BILIRUBIN TOTAL: 0.7 mg/dL (ref 0.2–1.2)
BUN / CREAT RATIO: 32 (calc) — AB (ref 6–22)
BUN: 43 mg/dL — AB (ref 7–25)
CHLORIDE: 100 mmol/L (ref 98–110)
CO2: 27 mmol/L (ref 20–32)
Calcium: 10.1 mg/dL (ref 8.6–10.4)
Creat: 1.33 mg/dL — ABNORMAL HIGH (ref 0.60–0.88)
GFR, Est African American: 43 mL/min/{1.73_m2} — ABNORMAL LOW (ref >=60)
GFR, Est Non African American: 37 mL/min/{1.73_m2} — ABNORMAL LOW (ref >=60)
GLUCOSE: 100 mg/dL — AB (ref 65–99)
Globulin: 2.4 g/dL (calc) (ref 1.9–3.7)
Potassium: 4.1 mmol/L (ref 3.5–5.3)
Sodium: 141 mmol/L (ref 135–146)
Total Protein: 6.7 g/dL (ref 6.1–8.1)

## 2017-05-07 LAB — LIPID PANEL
CHOLESTEROL: 233 mg/dL — AB (ref <200)
HDL: 94 mg/dL (ref >50)
LDL CHOLESTEROL (CALC): 120 mg/dL — AB
Non-HDL Cholesterol (Calc): 139 mg/dL (calc) — ABNORMAL HIGH (ref <130)
Total CHOL/HDL Ratio: 2.5 (calc) (ref <5.0)
Triglycerides: 87 mg/dL (ref <150)

## 2017-05-07 NOTE — Telephone Encounter (Signed)
Patient advised and orders placed.

## 2017-05-08 LAB — URINE CULTURE
MICRO NUMBER:: 90513775
SPECIMEN QUALITY:: ADEQUATE

## 2017-05-08 LAB — MICROALBUMIN / CREATININE URINE RATIO
Creatinine, Urine: 70 mg/dL (ref 20–275)
MICROALB UR: 65.2 mg/dL
Microalb Creat Ratio: 931 mcg/mg creat — ABNORMAL HIGH (ref <30)

## 2017-05-10 ENCOUNTER — Other Ambulatory Visit: Payer: Self-pay | Admitting: Family Medicine

## 2017-05-10 ENCOUNTER — Other Ambulatory Visit: Payer: Self-pay | Admitting: *Deleted

## 2017-05-10 DIAGNOSIS — R7989 Other specified abnormal findings of blood chemistry: Secondary | ICD-10-CM

## 2017-05-10 DIAGNOSIS — R600 Localized edema: Secondary | ICD-10-CM

## 2017-05-10 MED ORDER — FUROSEMIDE 20 MG PO TABS: 20.0000 mg | ORAL_TABLET | Freq: Every day | ORAL | 0 refills | Status: AC | PRN

## 2017-05-11 ENCOUNTER — Other Ambulatory Visit: Payer: Self-pay

## 2017-05-11 DIAGNOSIS — R809 Proteinuria, unspecified: Secondary | ICD-10-CM

## 2017-05-13 ENCOUNTER — Ambulatory Visit: Payer: Medicare Other | Admitting: Family Medicine

## 2017-05-13 NOTE — Progress Notes (Deleted)
   Subjective:    Patient ID: [name], female    DOB: [birth year], 84 y.o.   MRN: [id number]  HPI    Review of Systems     Objective:   Physical Exam        Assessment & Plan:

## 2017-05-26 DIAGNOSIS — M48061 Spinal stenosis, lumbar region without neurogenic claudication: Secondary | ICD-10-CM | POA: Diagnosis not present

## 2017-05-26 DIAGNOSIS — I10 Essential (primary) hypertension: Secondary | ICD-10-CM | POA: Diagnosis not present

## 2017-06-01 ENCOUNTER — Telehealth: Payer: Self-pay

## 2017-06-01 NOTE — Telephone Encounter (Signed)
Received letter from [facility] stating that pt may have been affected by recent Losartan recall.   I contacted pt and she states she is "pretty sure" that she spoke with her pharmacy and her medication was not involved in recall.  Pt does not wish to change therapy right now, due to having a good supply of Losartan left over.   Pt tells me that she will verify with her pharmacy that her medication does not need to be changed and will either call if it needs to be changed, or she will discuss it with Dr Madilyn Fireman at her [place] on 06-14-17.  No further needs nor concerns at this time.    Letter scanned to pt's chart.

## 2017-06-14 ENCOUNTER — Encounter: Payer: Self-pay | Admitting: Family Medicine

## 2017-06-14 ENCOUNTER — Ambulatory Visit (INDEPENDENT_AMBULATORY_CARE_PROVIDER_SITE_OTHER): Payer: Medicare Other

## 2017-06-14 ENCOUNTER — Ambulatory Visit (INDEPENDENT_AMBULATORY_CARE_PROVIDER_SITE_OTHER): Payer: Medicare Other | Admitting: Family Medicine

## 2017-06-14 VITALS — BP 138/72 | HR 72 | Ht 63.0 in | Wt 197.0 lb

## 2017-06-14 DIAGNOSIS — R809 Proteinuria, unspecified: Secondary | ICD-10-CM

## 2017-06-14 DIAGNOSIS — N3281 Overactive bladder: Secondary | ICD-10-CM | POA: Diagnosis not present

## 2017-06-14 DIAGNOSIS — M545 Low back pain: Secondary | ICD-10-CM | POA: Diagnosis not present

## 2017-06-14 DIAGNOSIS — R600 Localized edema: Secondary | ICD-10-CM | POA: Diagnosis not present

## 2017-06-14 DIAGNOSIS — I10 Essential (primary) hypertension: Secondary | ICD-10-CM | POA: Diagnosis not present

## 2017-06-14 DIAGNOSIS — R7989 Other specified abnormal findings of blood chemistry: Secondary | ICD-10-CM | POA: Diagnosis not present

## 2017-06-14 DIAGNOSIS — G8929 Other chronic pain: Secondary | ICD-10-CM

## 2017-06-14 DIAGNOSIS — R944 Abnormal results of kidney function studies: Secondary | ICD-10-CM | POA: Diagnosis not present

## 2017-06-14 MED ORDER — AMLODIPINE BESYLATE 2.5 MG PO TABS: 2.5000 mg | ORAL_TABLET | Freq: Every day | ORAL | 3 refills | Status: DC

## 2017-06-14 MED ORDER — DULOXETINE HCL 60 MG PO CPEP: 60.0000 mg | ORAL_CAPSULE | Freq: Every day | ORAL | 3 refills | Status: DC

## 2017-06-14 MED ORDER — TELMISARTAN 40 MG PO TABS: 40.0000 mg | ORAL_TABLET | Freq: Every day | ORAL | 0 refills | Status: DC

## 2017-06-14 MED ORDER — SOLIFENACIN SUCCINATE 5 MG PO TABS: 5.0000 mg | ORAL_TABLET | Freq: Every day | ORAL | 3 refills | Status: DC

## 2017-06-14 MED ORDER — ASPIRIN EC 81 MG PO TBEC: 81.0000 mg | DELAYED_RELEASE_TABLET | Freq: Every day | ORAL | 3 refills | Status: AC

## 2017-06-14 MED ORDER — CYANOCOBALAMIN 500 MCG PO TABS: 500.0000 ug | ORAL_TABLET | Freq: Every day | ORAL | 3 refills | Status: DC

## 2017-06-14 NOTE — Progress Notes (Signed)
Subjective:    CC:   HPI: Follow-up albuminuria-she is scheduled for her renal ultrasound this afternoon.  Hypertension- Pt denies chest pain, SOB, dizziness, or heart palpitations.  Taking meds as directed w/o problems.  Denies medication side effects.    He is also here for follow-up left-sided low back pain.  He was seen at the end of March for low back and left flank pain.  She was diagnosed with a lumbosacral strain.  She was given Toradol IM and says that that actually was helpful.  Her back overall is improved but not resolved.  She would like to consider some home physical therapy if possible.  Past medical history, Surgical history, Family history not pertinant except as noted below, Social history, Allergies, and medications have been entered into the medical record, reviewed, and corrections made.   Review of Systems: No fevers, chills, night sweats, weight loss, chest pain, or shortness of breath.   Objective:    General: Well Developed, well nourished, and in no acute distress.  Neuro: Alert and oriented x3, extra-ocular muscles intact, sensation grossly intact.  HEENT: Normocephalic, atraumatic  Skin: Warm and dry, no rashes. Cardiac: Regular rate and rhythm, no murmurs rubs or gallops.  1+ pitting edema of both ankles.   Respiratory: Clear to auscultation bilaterally. Not using accessory muscles, speaking in full sentences.   Impression and Recommendations:    Albuminuria-scheduled for renal ultrasound this afternoon and referral was placed to nephrology back in April.  Hypertension-well-controlled.  Was sent over refills for amlodipine and discontinue losartan which has been recalled and switch to telmisartan.  New prescription sent to [facility].  For recheck in 4 weeks.  Low back pain-we will refer for home PT.  She is doing better overall.  Overactive bladder-her oxybutynin is no longer covered since it was recalled recently.  She needs a substitute.  To  send over prescription for 5 mg of Vesicare.

## 2017-06-21 DIAGNOSIS — Z853 Personal history of malignant neoplasm of breast: Secondary | ICD-10-CM | POA: Diagnosis not present

## 2017-06-21 DIAGNOSIS — R1031 Right lower quadrant pain: Secondary | ICD-10-CM | POA: Diagnosis not present

## 2017-06-21 DIAGNOSIS — R188 Other ascites: Secondary | ICD-10-CM | POA: Diagnosis not present

## 2017-06-21 DIAGNOSIS — E785 Hyperlipidemia, unspecified: Secondary | ICD-10-CM | POA: Diagnosis not present

## 2017-06-21 DIAGNOSIS — K3532 Acute appendicitis with perforation and localized peritonitis, without abscess: Secondary | ICD-10-CM | POA: Diagnosis not present

## 2017-06-21 DIAGNOSIS — Z886 Allergy status to analgesic agent status: Secondary | ICD-10-CM | POA: Diagnosis not present

## 2017-06-21 DIAGNOSIS — D3501 Benign neoplasm of right adrenal gland: Secondary | ICD-10-CM | POA: Diagnosis not present

## 2017-06-21 DIAGNOSIS — K352 Acute appendicitis with generalized peritonitis, without abscess: Secondary | ICD-10-CM | POA: Diagnosis not present

## 2017-06-21 DIAGNOSIS — K3533 Acute appendicitis with perforation and localized peritonitis, with abscess: Secondary | ICD-10-CM | POA: Diagnosis not present

## 2017-06-21 DIAGNOSIS — Z882 Allergy status to sulfonamides status: Secondary | ICD-10-CM | POA: Diagnosis not present

## 2017-06-21 DIAGNOSIS — Z91040 Latex allergy status: Secondary | ICD-10-CM | POA: Diagnosis not present

## 2017-06-21 DIAGNOSIS — D649 Anemia, unspecified: Secondary | ICD-10-CM | POA: Diagnosis not present

## 2017-06-21 DIAGNOSIS — L89210 Pressure ulcer of right hip, unstageable: Secondary | ICD-10-CM | POA: Diagnosis not present

## 2017-06-21 DIAGNOSIS — Z885 Allergy status to narcotic agent status: Secondary | ICD-10-CM | POA: Diagnosis not present

## 2017-06-21 DIAGNOSIS — R52 Pain, unspecified: Secondary | ICD-10-CM | POA: Diagnosis not present

## 2017-06-21 DIAGNOSIS — H269 Unspecified cataract: Secondary | ICD-10-CM | POA: Diagnosis not present

## 2017-06-21 DIAGNOSIS — Z48815 Encounter for surgical aftercare following surgery on the digestive system: Secondary | ICD-10-CM | POA: Diagnosis not present

## 2017-06-21 DIAGNOSIS — J189 Pneumonia, unspecified organism: Secondary | ICD-10-CM | POA: Diagnosis not present

## 2017-06-21 DIAGNOSIS — K37 Unspecified appendicitis: Secondary | ICD-10-CM | POA: Diagnosis not present

## 2017-06-21 DIAGNOSIS — Z79899 Other long term (current) drug therapy: Secondary | ICD-10-CM | POA: Diagnosis not present

## 2017-06-21 DIAGNOSIS — K3580 Unspecified acute appendicitis: Secondary | ICD-10-CM | POA: Diagnosis not present

## 2017-06-21 DIAGNOSIS — E119 Type 2 diabetes mellitus without complications: Secondary | ICD-10-CM | POA: Diagnosis not present

## 2017-06-21 DIAGNOSIS — Z888 Allergy status to other drugs, medicaments and biological substances status: Secondary | ICD-10-CM | POA: Diagnosis not present

## 2017-06-21 DIAGNOSIS — N132 Hydronephrosis with renal and ureteral calculous obstruction: Secondary | ICD-10-CM | POA: Diagnosis not present

## 2017-06-21 DIAGNOSIS — Z7982 Long term (current) use of aspirin: Secondary | ICD-10-CM | POA: Diagnosis not present

## 2017-06-21 DIAGNOSIS — I10 Essential (primary) hypertension: Secondary | ICD-10-CM | POA: Diagnosis not present

## 2017-06-21 DIAGNOSIS — R109 Unspecified abdominal pain: Secondary | ICD-10-CM | POA: Diagnosis not present

## 2017-06-25 DIAGNOSIS — Z7902 Long term (current) use of antithrombotics/antiplatelets: Secondary | ICD-10-CM | POA: Diagnosis not present

## 2017-06-25 DIAGNOSIS — Z853 Personal history of malignant neoplasm of breast: Secondary | ICD-10-CM | POA: Diagnosis not present

## 2017-06-25 DIAGNOSIS — I10 Essential (primary) hypertension: Secondary | ICD-10-CM | POA: Diagnosis not present

## 2017-06-25 DIAGNOSIS — H269 Unspecified cataract: Secondary | ICD-10-CM | POA: Diagnosis not present

## 2017-06-25 DIAGNOSIS — L89210 Pressure ulcer of right hip, unstageable: Secondary | ICD-10-CM | POA: Diagnosis not present

## 2017-06-25 DIAGNOSIS — J189 Pneumonia, unspecified organism: Secondary | ICD-10-CM | POA: Diagnosis not present

## 2017-06-25 DIAGNOSIS — M48061 Spinal stenosis, lumbar region without neurogenic claudication: Secondary | ICD-10-CM | POA: Diagnosis not present

## 2017-06-25 DIAGNOSIS — K3580 Unspecified acute appendicitis: Secondary | ICD-10-CM | POA: Diagnosis not present

## 2017-06-25 DIAGNOSIS — Z48815 Encounter for surgical aftercare following surgery on the digestive system: Secondary | ICD-10-CM | POA: Diagnosis not present

## 2017-06-25 DIAGNOSIS — D649 Anemia, unspecified: Secondary | ICD-10-CM | POA: Diagnosis not present

## 2017-06-25 DIAGNOSIS — R609 Edema, unspecified: Secondary | ICD-10-CM | POA: Diagnosis not present

## 2017-06-25 DIAGNOSIS — E119 Type 2 diabetes mellitus without complications: Secondary | ICD-10-CM | POA: Diagnosis not present

## 2017-06-28 DIAGNOSIS — I10 Essential (primary) hypertension: Secondary | ICD-10-CM | POA: Diagnosis not present

## 2017-07-01 DIAGNOSIS — R609 Edema, unspecified: Secondary | ICD-10-CM | POA: Diagnosis not present

## 2017-07-01 DIAGNOSIS — Z48815 Encounter for surgical aftercare following surgery on the digestive system: Secondary | ICD-10-CM | POA: Diagnosis not present

## 2017-07-01 DIAGNOSIS — I10 Essential (primary) hypertension: Secondary | ICD-10-CM | POA: Diagnosis not present

## 2017-07-12 DIAGNOSIS — Z7902 Long term (current) use of antithrombotics/antiplatelets: Secondary | ICD-10-CM | POA: Diagnosis not present

## 2017-07-14 DIAGNOSIS — Z48815 Encounter for surgical aftercare following surgery on the digestive system: Secondary | ICD-10-CM | POA: Diagnosis not present

## 2017-07-14 DIAGNOSIS — E119 Type 2 diabetes mellitus without complications: Secondary | ICD-10-CM | POA: Diagnosis not present

## 2017-07-14 DIAGNOSIS — D649 Anemia, unspecified: Secondary | ICD-10-CM | POA: Diagnosis not present

## 2017-07-14 DIAGNOSIS — H269 Unspecified cataract: Secondary | ICD-10-CM | POA: Diagnosis not present

## 2017-07-14 DIAGNOSIS — I10 Essential (primary) hypertension: Secondary | ICD-10-CM | POA: Diagnosis not present

## 2017-07-15 DIAGNOSIS — E119 Type 2 diabetes mellitus without complications: Secondary | ICD-10-CM | POA: Diagnosis not present

## 2017-07-15 DIAGNOSIS — H269 Unspecified cataract: Secondary | ICD-10-CM | POA: Diagnosis not present

## 2017-07-15 DIAGNOSIS — I10 Essential (primary) hypertension: Secondary | ICD-10-CM | POA: Diagnosis not present

## 2017-07-15 DIAGNOSIS — D649 Anemia, unspecified: Secondary | ICD-10-CM | POA: Diagnosis not present

## 2017-07-15 DIAGNOSIS — Z48815 Encounter for surgical aftercare following surgery on the digestive system: Secondary | ICD-10-CM | POA: Diagnosis not present

## 2017-07-16 DIAGNOSIS — I10 Essential (primary) hypertension: Secondary | ICD-10-CM | POA: Diagnosis not present

## 2017-07-16 DIAGNOSIS — E119 Type 2 diabetes mellitus without complications: Secondary | ICD-10-CM | POA: Diagnosis not present

## 2017-07-16 DIAGNOSIS — H269 Unspecified cataract: Secondary | ICD-10-CM | POA: Diagnosis not present

## 2017-07-16 DIAGNOSIS — Z48815 Encounter for surgical aftercare following surgery on the digestive system: Secondary | ICD-10-CM | POA: Diagnosis not present

## 2017-07-16 DIAGNOSIS — D649 Anemia, unspecified: Secondary | ICD-10-CM | POA: Diagnosis not present

## 2017-07-17 DIAGNOSIS — Z48815 Encounter for surgical aftercare following surgery on the digestive system: Secondary | ICD-10-CM | POA: Diagnosis not present

## 2017-07-17 DIAGNOSIS — I10 Essential (primary) hypertension: Secondary | ICD-10-CM | POA: Diagnosis not present

## 2017-07-17 DIAGNOSIS — D649 Anemia, unspecified: Secondary | ICD-10-CM | POA: Diagnosis not present

## 2017-07-17 DIAGNOSIS — H269 Unspecified cataract: Secondary | ICD-10-CM | POA: Diagnosis not present

## 2017-07-17 DIAGNOSIS — E119 Type 2 diabetes mellitus without complications: Secondary | ICD-10-CM | POA: Diagnosis not present

## 2017-07-18 DIAGNOSIS — I10 Essential (primary) hypertension: Secondary | ICD-10-CM | POA: Diagnosis not present

## 2017-07-18 DIAGNOSIS — E119 Type 2 diabetes mellitus without complications: Secondary | ICD-10-CM | POA: Diagnosis not present

## 2017-07-18 DIAGNOSIS — D649 Anemia, unspecified: Secondary | ICD-10-CM | POA: Diagnosis not present

## 2017-07-18 DIAGNOSIS — H269 Unspecified cataract: Secondary | ICD-10-CM | POA: Diagnosis not present

## 2017-07-18 DIAGNOSIS — Z48815 Encounter for surgical aftercare following surgery on the digestive system: Secondary | ICD-10-CM | POA: Diagnosis not present

## 2017-07-19 DIAGNOSIS — Z48815 Encounter for surgical aftercare following surgery on the digestive system: Secondary | ICD-10-CM | POA: Diagnosis not present

## 2017-07-19 DIAGNOSIS — I10 Essential (primary) hypertension: Secondary | ICD-10-CM | POA: Diagnosis not present

## 2017-07-19 DIAGNOSIS — E119 Type 2 diabetes mellitus without complications: Secondary | ICD-10-CM | POA: Diagnosis not present

## 2017-07-19 DIAGNOSIS — H269 Unspecified cataract: Secondary | ICD-10-CM | POA: Diagnosis not present

## 2017-07-19 DIAGNOSIS — D649 Anemia, unspecified: Secondary | ICD-10-CM | POA: Diagnosis not present

## 2017-07-20 DIAGNOSIS — E119 Type 2 diabetes mellitus without complications: Secondary | ICD-10-CM | POA: Diagnosis not present

## 2017-07-20 DIAGNOSIS — H269 Unspecified cataract: Secondary | ICD-10-CM | POA: Diagnosis not present

## 2017-07-20 DIAGNOSIS — I10 Essential (primary) hypertension: Secondary | ICD-10-CM | POA: Diagnosis not present

## 2017-07-20 DIAGNOSIS — D649 Anemia, unspecified: Secondary | ICD-10-CM | POA: Diagnosis not present

## 2017-07-20 DIAGNOSIS — Z48815 Encounter for surgical aftercare following surgery on the digestive system: Secondary | ICD-10-CM | POA: Diagnosis not present

## 2017-07-21 DIAGNOSIS — I10 Essential (primary) hypertension: Secondary | ICD-10-CM | POA: Diagnosis not present

## 2017-07-21 DIAGNOSIS — D649 Anemia, unspecified: Secondary | ICD-10-CM | POA: Diagnosis not present

## 2017-07-21 DIAGNOSIS — Z48815 Encounter for surgical aftercare following surgery on the digestive system: Secondary | ICD-10-CM | POA: Diagnosis not present

## 2017-07-21 DIAGNOSIS — H269 Unspecified cataract: Secondary | ICD-10-CM | POA: Diagnosis not present

## 2017-07-21 DIAGNOSIS — E119 Type 2 diabetes mellitus without complications: Secondary | ICD-10-CM | POA: Diagnosis not present

## 2017-07-22 DIAGNOSIS — H269 Unspecified cataract: Secondary | ICD-10-CM | POA: Diagnosis not present

## 2017-07-22 DIAGNOSIS — E119 Type 2 diabetes mellitus without complications: Secondary | ICD-10-CM | POA: Diagnosis not present

## 2017-07-22 DIAGNOSIS — I10 Essential (primary) hypertension: Secondary | ICD-10-CM | POA: Diagnosis not present

## 2017-07-22 DIAGNOSIS — D649 Anemia, unspecified: Secondary | ICD-10-CM | POA: Diagnosis not present

## 2017-07-22 DIAGNOSIS — Z48815 Encounter for surgical aftercare following surgery on the digestive system: Secondary | ICD-10-CM | POA: Diagnosis not present

## 2017-07-23 DIAGNOSIS — Z48815 Encounter for surgical aftercare following surgery on the digestive system: Secondary | ICD-10-CM | POA: Diagnosis not present

## 2017-07-23 DIAGNOSIS — I10 Essential (primary) hypertension: Secondary | ICD-10-CM | POA: Diagnosis not present

## 2017-07-23 DIAGNOSIS — E119 Type 2 diabetes mellitus without complications: Secondary | ICD-10-CM | POA: Diagnosis not present

## 2017-07-23 DIAGNOSIS — H269 Unspecified cataract: Secondary | ICD-10-CM | POA: Diagnosis not present

## 2017-07-23 DIAGNOSIS — D649 Anemia, unspecified: Secondary | ICD-10-CM | POA: Diagnosis not present

## 2017-07-24 DIAGNOSIS — Z48815 Encounter for surgical aftercare following surgery on the digestive system: Secondary | ICD-10-CM | POA: Diagnosis not present

## 2017-07-24 DIAGNOSIS — H269 Unspecified cataract: Secondary | ICD-10-CM | POA: Diagnosis not present

## 2017-07-24 DIAGNOSIS — D649 Anemia, unspecified: Secondary | ICD-10-CM | POA: Diagnosis not present

## 2017-07-24 DIAGNOSIS — I10 Essential (primary) hypertension: Secondary | ICD-10-CM | POA: Diagnosis not present

## 2017-07-24 DIAGNOSIS — E119 Type 2 diabetes mellitus without complications: Secondary | ICD-10-CM | POA: Diagnosis not present

## 2017-07-25 DIAGNOSIS — E119 Type 2 diabetes mellitus without complications: Secondary | ICD-10-CM | POA: Diagnosis not present

## 2017-07-25 DIAGNOSIS — I10 Essential (primary) hypertension: Secondary | ICD-10-CM | POA: Diagnosis not present

## 2017-07-25 DIAGNOSIS — Z48815 Encounter for surgical aftercare following surgery on the digestive system: Secondary | ICD-10-CM | POA: Diagnosis not present

## 2017-07-25 DIAGNOSIS — H269 Unspecified cataract: Secondary | ICD-10-CM | POA: Diagnosis not present

## 2017-07-25 DIAGNOSIS — D649 Anemia, unspecified: Secondary | ICD-10-CM | POA: Diagnosis not present

## 2017-07-26 ENCOUNTER — Inpatient Hospital Stay: Payer: Medicare Other | Admitting: Family Medicine

## 2017-07-26 DIAGNOSIS — E119 Type 2 diabetes mellitus without complications: Secondary | ICD-10-CM | POA: Diagnosis not present

## 2017-07-26 DIAGNOSIS — D649 Anemia, unspecified: Secondary | ICD-10-CM | POA: Diagnosis not present

## 2017-07-26 DIAGNOSIS — M48061 Spinal stenosis, lumbar region without neurogenic claudication: Secondary | ICD-10-CM | POA: Diagnosis not present

## 2017-07-26 DIAGNOSIS — I10 Essential (primary) hypertension: Secondary | ICD-10-CM | POA: Diagnosis not present

## 2017-07-26 DIAGNOSIS — H269 Unspecified cataract: Secondary | ICD-10-CM | POA: Diagnosis not present

## 2017-07-26 DIAGNOSIS — Z48815 Encounter for surgical aftercare following surgery on the digestive system: Secondary | ICD-10-CM | POA: Diagnosis not present

## 2017-07-27 DIAGNOSIS — H269 Unspecified cataract: Secondary | ICD-10-CM | POA: Diagnosis not present

## 2017-07-27 DIAGNOSIS — E119 Type 2 diabetes mellitus without complications: Secondary | ICD-10-CM | POA: Diagnosis not present

## 2017-07-27 DIAGNOSIS — Z48815 Encounter for surgical aftercare following surgery on the digestive system: Secondary | ICD-10-CM | POA: Diagnosis not present

## 2017-07-27 DIAGNOSIS — D649 Anemia, unspecified: Secondary | ICD-10-CM | POA: Diagnosis not present

## 2017-07-27 DIAGNOSIS — I10 Essential (primary) hypertension: Secondary | ICD-10-CM | POA: Diagnosis not present

## 2017-07-28 DIAGNOSIS — I10 Essential (primary) hypertension: Secondary | ICD-10-CM | POA: Diagnosis not present

## 2017-07-28 DIAGNOSIS — Z48815 Encounter for surgical aftercare following surgery on the digestive system: Secondary | ICD-10-CM | POA: Diagnosis not present

## 2017-07-28 DIAGNOSIS — D649 Anemia, unspecified: Secondary | ICD-10-CM | POA: Diagnosis not present

## 2017-07-28 DIAGNOSIS — E119 Type 2 diabetes mellitus without complications: Secondary | ICD-10-CM | POA: Diagnosis not present

## 2017-07-28 DIAGNOSIS — H269 Unspecified cataract: Secondary | ICD-10-CM | POA: Diagnosis not present

## 2017-07-29 DIAGNOSIS — I10 Essential (primary) hypertension: Secondary | ICD-10-CM | POA: Diagnosis not present

## 2017-07-29 DIAGNOSIS — H269 Unspecified cataract: Secondary | ICD-10-CM | POA: Diagnosis not present

## 2017-07-29 DIAGNOSIS — D649 Anemia, unspecified: Secondary | ICD-10-CM | POA: Diagnosis not present

## 2017-07-29 DIAGNOSIS — Z48815 Encounter for surgical aftercare following surgery on the digestive system: Secondary | ICD-10-CM | POA: Diagnosis not present

## 2017-07-29 DIAGNOSIS — E119 Type 2 diabetes mellitus without complications: Secondary | ICD-10-CM | POA: Diagnosis not present

## 2017-07-30 DIAGNOSIS — H269 Unspecified cataract: Secondary | ICD-10-CM | POA: Diagnosis not present

## 2017-07-30 DIAGNOSIS — E119 Type 2 diabetes mellitus without complications: Secondary | ICD-10-CM | POA: Diagnosis not present

## 2017-07-30 DIAGNOSIS — Z48815 Encounter for surgical aftercare following surgery on the digestive system: Secondary | ICD-10-CM | POA: Diagnosis not present

## 2017-07-30 DIAGNOSIS — I10 Essential (primary) hypertension: Secondary | ICD-10-CM | POA: Diagnosis not present

## 2017-07-30 DIAGNOSIS — D649 Anemia, unspecified: Secondary | ICD-10-CM | POA: Diagnosis not present

## 2017-07-31 DIAGNOSIS — E119 Type 2 diabetes mellitus without complications: Secondary | ICD-10-CM | POA: Diagnosis not present

## 2017-07-31 DIAGNOSIS — H269 Unspecified cataract: Secondary | ICD-10-CM | POA: Diagnosis not present

## 2017-07-31 DIAGNOSIS — Z48815 Encounter for surgical aftercare following surgery on the digestive system: Secondary | ICD-10-CM | POA: Diagnosis not present

## 2017-07-31 DIAGNOSIS — I10 Essential (primary) hypertension: Secondary | ICD-10-CM | POA: Diagnosis not present

## 2017-07-31 DIAGNOSIS — D649 Anemia, unspecified: Secondary | ICD-10-CM | POA: Diagnosis not present

## 2017-08-01 DIAGNOSIS — E119 Type 2 diabetes mellitus without complications: Secondary | ICD-10-CM | POA: Diagnosis not present

## 2017-08-01 DIAGNOSIS — H269 Unspecified cataract: Secondary | ICD-10-CM | POA: Diagnosis not present

## 2017-08-01 DIAGNOSIS — I10 Essential (primary) hypertension: Secondary | ICD-10-CM | POA: Diagnosis not present

## 2017-08-01 DIAGNOSIS — D649 Anemia, unspecified: Secondary | ICD-10-CM | POA: Diagnosis not present

## 2017-08-01 DIAGNOSIS — Z48815 Encounter for surgical aftercare following surgery on the digestive system: Secondary | ICD-10-CM | POA: Diagnosis not present

## 2017-08-02 DIAGNOSIS — Z48815 Encounter for surgical aftercare following surgery on the digestive system: Secondary | ICD-10-CM | POA: Diagnosis not present

## 2017-08-02 DIAGNOSIS — I10 Essential (primary) hypertension: Secondary | ICD-10-CM | POA: Diagnosis not present

## 2017-08-02 DIAGNOSIS — H269 Unspecified cataract: Secondary | ICD-10-CM | POA: Diagnosis not present

## 2017-08-02 DIAGNOSIS — D649 Anemia, unspecified: Secondary | ICD-10-CM | POA: Diagnosis not present

## 2017-08-02 DIAGNOSIS — E119 Type 2 diabetes mellitus without complications: Secondary | ICD-10-CM | POA: Diagnosis not present

## 2017-08-03 ENCOUNTER — Encounter: Payer: Self-pay | Admitting: Family Medicine

## 2017-08-03 ENCOUNTER — Ambulatory Visit (INDEPENDENT_AMBULATORY_CARE_PROVIDER_SITE_OTHER): Payer: Medicare Other | Admitting: Family Medicine

## 2017-08-03 ENCOUNTER — Telehealth: Payer: Self-pay

## 2017-08-03 VITALS — BP 165/92 | Ht 63.0 in | Wt 187.0 lb

## 2017-08-03 DIAGNOSIS — N3281 Overactive bladder: Secondary | ICD-10-CM

## 2017-08-03 DIAGNOSIS — Z48815 Encounter for surgical aftercare following surgery on the digestive system: Secondary | ICD-10-CM | POA: Diagnosis not present

## 2017-08-03 DIAGNOSIS — B372 Candidiasis of skin and nail: Secondary | ICD-10-CM | POA: Diagnosis not present

## 2017-08-03 DIAGNOSIS — T8131XA Disruption of external operation (surgical) wound, not elsewhere classified, initial encounter: Secondary | ICD-10-CM | POA: Diagnosis not present

## 2017-08-03 DIAGNOSIS — I10 Essential (primary) hypertension: Secondary | ICD-10-CM

## 2017-08-03 DIAGNOSIS — H269 Unspecified cataract: Secondary | ICD-10-CM | POA: Diagnosis not present

## 2017-08-03 DIAGNOSIS — E119 Type 2 diabetes mellitus without complications: Secondary | ICD-10-CM | POA: Diagnosis not present

## 2017-08-03 DIAGNOSIS — D649 Anemia, unspecified: Secondary | ICD-10-CM | POA: Diagnosis not present

## 2017-08-03 MED ORDER — SOLIFENACIN SUCCINATE 5 MG PO TABS: 5.0000 mg | ORAL_TABLET | Freq: Every day | ORAL | 3 refills | Status: DC

## 2017-08-03 MED ORDER — FLUCONAZOLE 150 MG PO TABS: 150.0000 mg | ORAL_TABLET | ORAL | 0 refills | Status: DC

## 2017-08-03 NOTE — Telephone Encounter (Signed)
[name] needs a hospital follow up this week. She is very concerned about the new medications she was given post surgery. Please advise.

## 2017-08-03 NOTE — Progress Notes (Signed)
Subjective:    Patient ID: [name], female    DOB: Dec 22, 1932, 84 y.o.   MRN: [id number]  HPI 84 year old female comes in for follow-up today.  She underwent an appendectomy on June 10.  They tried to do it laparoscopically but were unable to do so they had to switch to an open appendectomy.  She did require wound care including wet-to-dry dressings and did go for her follow-up on July 3 with the surgeon.  She is still getting wet-to-dry dressings done by home health daily and or every other day.  She still having a little bit of bloody drainage over the wound as part of it is still open.  She is now getting a yeast infection around the wound.  Hypertension- Pt denies chest pain, SOB, dizziness, or heart palpitations.  Taking meds as directed w/o problems.  Denies medication side effects.  We had to switch her off of losartan HCTZ because of the drug recall. She is given a prescription for valsartan when she went to the emergency department so she was confused about her blood pressure medications and did not take them today.  Overactive bladder-we actually switched her to [place] also because her oxybutynin was no longer covered by her insurance plan.  Actually still has some old oxybutynin and says she never got the prescription for Vesicare.  Her local pharmacy also closed and so that may have caused some confusion.  Lower extremity edema is much better.  Review of Systems   BP (!) 165/92   Ht 5\' 3"  (1.6 m)   Wt 187 lb (84.8 kg)   BMI 33.13 kg/m     Allergies  Allergen Reactions  . Aspirin Other (See Comments)    ulcer  . Gabapentin Hives and Itching  . Oxycodone Other (See Comments)    Pt unsure  . Simvastatin Other (See Comments)    Lost the use of her legs  . Sulfonamide Derivatives Hives and Itching  . Amlodipine Other (See Comments)    Pedal edema  . Atorvastatin   . Azithromycin   . Beta Adrenergic Blockers   . Captopril   . Codeine   . Fluvastatin Sodium    . Hydrochlorothiazide Other (See Comments)    INcreaseed BUN/Creatinine  . Latex   . Lisinopril Other (See Comments)    Increased BUN/CR  . Lovastatin   . Macrolides And Ketolides   . Rosiglitazone     Past Medical History:  Diagnosis Date  . Adenocarcinoma ([place])    breast, right  . Anemia    iron deficiency- NOS  . Anxiety   . Atrial fibrillation ([place])    setting of pneumonia  . DDD (degenerative disc disease), lumbar    Dr Owens Shark  . Depression   . Diabetic retinopathy    Dr Jodi Mourning  . Discoid lupus   . Gastric ulcer   . Headache(784.0)    chronic  . Hemorrhoids   . Hyperlipidemia   . Hypertension    oral meds  . IBS (irritable bowel syndrome)   . Lumbar disc disease   . Lupus erythematosus    discoid  . Morton neuroma    right- Dr Wardell Honour  . Peripheral neuropathy   . PUD (peptic ulcer disease)   . Renal insufficiency     Past Surgical History:  Procedure Laterality Date  . ABDOMINAL HYSTERECTOMY     1 oophorectomy  . CHOLECYSTECTOMY  08/2005  . LUMBAR LAMINECTOMY    . right partial  mastectomy w/sentinel lymph node biopsy  11-09  . TOTAL KNEE ARTHROPLASTY     both- Dr Devona Konig    Social History   Socioeconomic History  . Marital status: Widowed    Spouse name: Not on file  . Number of children: 3  . Years of education: Not on file  . Highest education level: Not on file  Occupational History  . Occupation: Retired  Scientific laboratory technician  . Financial resource strain: Not on file  . Food insecurity:    Worry: Not on file    Inability: Not on file  . Transportation needs:    Medical: Not on file    Non-medical: Not on file  Tobacco Use  . Smoking status: Never Smoker  . Smokeless tobacco: Never Used  Substance and Sexual Activity  . Alcohol use: Yes    Alcohol/week: 0.6 oz    Types: 1 Glasses of wine per week    Comment: occasional  . Drug use: No  . Sexual activity: Not on file  Lifestyle  . Physical activity:    Days per week: Not on file     Minutes per session: Not on file  . Stress: Not on file  Relationships  . Social connections:    Talks on phone: Not on file    Gets together: Not on file    Attends religious service: Not on file    Active member of club or organization: Not on file    Attends meetings of clubs or organizations: Not on file    Relationship status: Not on file  . Intimate partner violence:    Fear of current or ex partner: Not on file    Emotionally abused: Not on file    Physically abused: Not on file    Forced sexual activity: Not on file  Other Topics Concern  . Not on file  Social History Narrative  . Not on file    Family History  Problem Relation Age of Onset  . Diabetes Mother   . Angina Mother   . COPD Father        colon  . Diabetes Father   . Stroke Father   . Hyperlipidemia Father   . Colon cancer Father     Outpatient Encounter Medications as of 08/03/2017  Medication Sig  . AMBULATORY NON FORMULARY MEDICATION Medication Name: Wheelchair, [place] with brakes.  Dx. Lower extremity weakness.  Marland Kitchen amLODipine (NORVASC) 2.5 MG tablet Take 1 tablet (2.5 mg total) by mouth daily.  Marland Kitchen aspirin EC 81 MG tablet Take 1 tablet (81 mg total) by mouth daily.  . Calcium Carbonate-Vitamin D (CALTRATE 600+D) 600-400 MG-UNIT per tablet Take 1 tablet by mouth 2 (two) times daily.   . DULoxetine (CYMBALTA) 60 MG capsule Take 1 capsule (60 mg total) by mouth daily. Will be due for refill next month  . fluconazole (DIFLUCAN) 150 MG tablet Take 1 tablet (150 mg total) by mouth every other day.  . furosemide (LASIX) 20 MG tablet Take 1 tablet (20 mg total) by mouth daily as needed for edema.  . hydroxychloroquine (PLAQUENIL) 200 MG tablet Take 200 mg by mouth daily. Take two  By mouth daily  . Ibuprofen 200 MG CAPS Take by mouth.  . metoprolol tartrate (LOPRESSOR) 100 MG tablet Take 1 tablet (100 mg total) by mouth 2 (two) times daily.  . solifenacin (VESICARE) 5 MG tablet Take 1 tablet (5 mg total) by  mouth daily. If to expensive please call pt:  .  sucralfate (CARAFATE) 1 g tablet Take 1 tablet (1 g total) by mouth 2 (two) times daily.  Marland Kitchen telmisartan (MICARDIS) 40 MG tablet Take 1 tablet (40 mg total) by mouth daily.  . vitamin B-12 (CYANOCOBALAMIN) 500 MCG tablet Take 1 tablet (500 mcg total) by mouth daily.  . [DISCONTINUED] solifenacin (VESICARE) 5 MG tablet Take 1 tablet (5 mg total) by mouth daily. If to expensive please call pt:   No facility-administered encounter medications on file as of 08/03/2017.          Objective:   Physical Exam  Constitutional: She is oriented to person, place, and time. She appears well-developed and well-nourished.  HENT:  Head: Normocephalic and atraumatic.  Cardiovascular: Normal rate, regular rhythm and normal heart sounds.  Pulmonary/Chest: Effort normal and breath sounds normal.  Neurological: She is alert and oriented to person, place, and time.  Skin: Skin is warm and dry.  Psychiatric: She has a normal mood and affect. Her behavior is normal.          Assessment & Plan:   Status post appendectomy/surgical wound dehiscence, initial encounter-I will be happy to take over her wound care and will need to get that information to home health.    Skin candidiasis -we will treat with oral Diflucan.  HTN -losartan was recalled.  She does have a 90-day supply of telmisartan so I want her to continue that for now and follow-up in 3 months.  Overactive bladder- To complete the oxybutynin that she still has and after that we can see if they will cover the Vesicare.

## 2017-08-04 ENCOUNTER — Encounter: Payer: Self-pay | Admitting: Family Medicine

## 2017-08-04 DIAGNOSIS — D649 Anemia, unspecified: Secondary | ICD-10-CM | POA: Diagnosis not present

## 2017-08-04 DIAGNOSIS — E119 Type 2 diabetes mellitus without complications: Secondary | ICD-10-CM | POA: Diagnosis not present

## 2017-08-04 DIAGNOSIS — H269 Unspecified cataract: Secondary | ICD-10-CM | POA: Diagnosis not present

## 2017-08-04 DIAGNOSIS — I10 Essential (primary) hypertension: Secondary | ICD-10-CM | POA: Diagnosis not present

## 2017-08-04 DIAGNOSIS — Z48815 Encounter for surgical aftercare following surgery on the digestive system: Secondary | ICD-10-CM | POA: Diagnosis not present

## 2017-08-05 DIAGNOSIS — H269 Unspecified cataract: Secondary | ICD-10-CM | POA: Diagnosis not present

## 2017-08-05 DIAGNOSIS — D649 Anemia, unspecified: Secondary | ICD-10-CM | POA: Diagnosis not present

## 2017-08-05 DIAGNOSIS — I10 Essential (primary) hypertension: Secondary | ICD-10-CM | POA: Diagnosis not present

## 2017-08-05 DIAGNOSIS — Z48815 Encounter for surgical aftercare following surgery on the digestive system: Secondary | ICD-10-CM | POA: Diagnosis not present

## 2017-08-05 DIAGNOSIS — E119 Type 2 diabetes mellitus without complications: Secondary | ICD-10-CM | POA: Diagnosis not present

## 2017-08-06 DIAGNOSIS — E119 Type 2 diabetes mellitus without complications: Secondary | ICD-10-CM | POA: Diagnosis not present

## 2017-08-06 DIAGNOSIS — Z48815 Encounter for surgical aftercare following surgery on the digestive system: Secondary | ICD-10-CM | POA: Diagnosis not present

## 2017-08-06 DIAGNOSIS — D649 Anemia, unspecified: Secondary | ICD-10-CM | POA: Diagnosis not present

## 2017-08-06 DIAGNOSIS — I10 Essential (primary) hypertension: Secondary | ICD-10-CM | POA: Diagnosis not present

## 2017-08-06 DIAGNOSIS — H269 Unspecified cataract: Secondary | ICD-10-CM | POA: Diagnosis not present

## 2017-08-07 DIAGNOSIS — H269 Unspecified cataract: Secondary | ICD-10-CM | POA: Diagnosis not present

## 2017-08-07 DIAGNOSIS — D649 Anemia, unspecified: Secondary | ICD-10-CM | POA: Diagnosis not present

## 2017-08-07 DIAGNOSIS — Z48815 Encounter for surgical aftercare following surgery on the digestive system: Secondary | ICD-10-CM | POA: Diagnosis not present

## 2017-08-07 DIAGNOSIS — I10 Essential (primary) hypertension: Secondary | ICD-10-CM | POA: Diagnosis not present

## 2017-08-07 DIAGNOSIS — E119 Type 2 diabetes mellitus without complications: Secondary | ICD-10-CM | POA: Diagnosis not present

## 2017-08-08 DIAGNOSIS — D649 Anemia, unspecified: Secondary | ICD-10-CM | POA: Diagnosis not present

## 2017-08-08 DIAGNOSIS — H269 Unspecified cataract: Secondary | ICD-10-CM | POA: Diagnosis not present

## 2017-08-08 DIAGNOSIS — E119 Type 2 diabetes mellitus without complications: Secondary | ICD-10-CM | POA: Diagnosis not present

## 2017-08-08 DIAGNOSIS — I10 Essential (primary) hypertension: Secondary | ICD-10-CM | POA: Diagnosis not present

## 2017-08-08 DIAGNOSIS — Z48815 Encounter for surgical aftercare following surgery on the digestive system: Secondary | ICD-10-CM | POA: Diagnosis not present

## 2017-08-09 DIAGNOSIS — E119 Type 2 diabetes mellitus without complications: Secondary | ICD-10-CM | POA: Diagnosis not present

## 2017-08-09 DIAGNOSIS — D649 Anemia, unspecified: Secondary | ICD-10-CM | POA: Diagnosis not present

## 2017-08-09 DIAGNOSIS — I10 Essential (primary) hypertension: Secondary | ICD-10-CM | POA: Diagnosis not present

## 2017-08-09 DIAGNOSIS — H269 Unspecified cataract: Secondary | ICD-10-CM | POA: Diagnosis not present

## 2017-08-09 DIAGNOSIS — Z48815 Encounter for surgical aftercare following surgery on the digestive system: Secondary | ICD-10-CM | POA: Diagnosis not present

## 2017-08-10 DIAGNOSIS — I10 Essential (primary) hypertension: Secondary | ICD-10-CM | POA: Diagnosis not present

## 2017-08-10 DIAGNOSIS — Z48815 Encounter for surgical aftercare following surgery on the digestive system: Secondary | ICD-10-CM | POA: Diagnosis not present

## 2017-08-10 DIAGNOSIS — H269 Unspecified cataract: Secondary | ICD-10-CM | POA: Diagnosis not present

## 2017-08-10 DIAGNOSIS — D649 Anemia, unspecified: Secondary | ICD-10-CM | POA: Diagnosis not present

## 2017-08-10 DIAGNOSIS — E119 Type 2 diabetes mellitus without complications: Secondary | ICD-10-CM | POA: Diagnosis not present

## 2017-08-11 DIAGNOSIS — I10 Essential (primary) hypertension: Secondary | ICD-10-CM | POA: Diagnosis not present

## 2017-08-11 DIAGNOSIS — D649 Anemia, unspecified: Secondary | ICD-10-CM | POA: Diagnosis not present

## 2017-08-11 DIAGNOSIS — Z48815 Encounter for surgical aftercare following surgery on the digestive system: Secondary | ICD-10-CM | POA: Diagnosis not present

## 2017-08-11 DIAGNOSIS — H269 Unspecified cataract: Secondary | ICD-10-CM | POA: Diagnosis not present

## 2017-08-11 DIAGNOSIS — E119 Type 2 diabetes mellitus without complications: Secondary | ICD-10-CM | POA: Diagnosis not present

## 2017-08-12 DIAGNOSIS — Z48815 Encounter for surgical aftercare following surgery on the digestive system: Secondary | ICD-10-CM | POA: Diagnosis not present

## 2017-08-12 DIAGNOSIS — D649 Anemia, unspecified: Secondary | ICD-10-CM | POA: Diagnosis not present

## 2017-08-12 DIAGNOSIS — I10 Essential (primary) hypertension: Secondary | ICD-10-CM | POA: Diagnosis not present

## 2017-08-12 DIAGNOSIS — E119 Type 2 diabetes mellitus without complications: Secondary | ICD-10-CM | POA: Diagnosis not present

## 2017-08-12 DIAGNOSIS — H269 Unspecified cataract: Secondary | ICD-10-CM | POA: Diagnosis not present

## 2017-08-13 DIAGNOSIS — Z48815 Encounter for surgical aftercare following surgery on the digestive system: Secondary | ICD-10-CM | POA: Diagnosis not present

## 2017-08-13 DIAGNOSIS — D649 Anemia, unspecified: Secondary | ICD-10-CM | POA: Diagnosis not present

## 2017-08-13 DIAGNOSIS — H269 Unspecified cataract: Secondary | ICD-10-CM | POA: Diagnosis not present

## 2017-08-13 DIAGNOSIS — I10 Essential (primary) hypertension: Secondary | ICD-10-CM | POA: Diagnosis not present

## 2017-08-13 DIAGNOSIS — E119 Type 2 diabetes mellitus without complications: Secondary | ICD-10-CM | POA: Diagnosis not present

## 2017-08-14 DIAGNOSIS — H269 Unspecified cataract: Secondary | ICD-10-CM | POA: Diagnosis not present

## 2017-08-14 DIAGNOSIS — Z48815 Encounter for surgical aftercare following surgery on the digestive system: Secondary | ICD-10-CM | POA: Diagnosis not present

## 2017-08-14 DIAGNOSIS — I10 Essential (primary) hypertension: Secondary | ICD-10-CM | POA: Diagnosis not present

## 2017-08-14 DIAGNOSIS — D649 Anemia, unspecified: Secondary | ICD-10-CM | POA: Diagnosis not present

## 2017-08-14 DIAGNOSIS — E119 Type 2 diabetes mellitus without complications: Secondary | ICD-10-CM | POA: Diagnosis not present

## 2017-08-15 DIAGNOSIS — H269 Unspecified cataract: Secondary | ICD-10-CM | POA: Diagnosis not present

## 2017-08-15 DIAGNOSIS — D649 Anemia, unspecified: Secondary | ICD-10-CM | POA: Diagnosis not present

## 2017-08-15 DIAGNOSIS — Z48815 Encounter for surgical aftercare following surgery on the digestive system: Secondary | ICD-10-CM | POA: Diagnosis not present

## 2017-08-15 DIAGNOSIS — E119 Type 2 diabetes mellitus without complications: Secondary | ICD-10-CM | POA: Diagnosis not present

## 2017-08-15 DIAGNOSIS — I10 Essential (primary) hypertension: Secondary | ICD-10-CM | POA: Diagnosis not present

## 2017-08-16 DIAGNOSIS — Z48815 Encounter for surgical aftercare following surgery on the digestive system: Secondary | ICD-10-CM | POA: Diagnosis not present

## 2017-08-16 DIAGNOSIS — D649 Anemia, unspecified: Secondary | ICD-10-CM | POA: Diagnosis not present

## 2017-08-16 DIAGNOSIS — E119 Type 2 diabetes mellitus without complications: Secondary | ICD-10-CM | POA: Diagnosis not present

## 2017-08-16 DIAGNOSIS — H269 Unspecified cataract: Secondary | ICD-10-CM | POA: Diagnosis not present

## 2017-08-16 DIAGNOSIS — I10 Essential (primary) hypertension: Secondary | ICD-10-CM | POA: Diagnosis not present

## 2017-08-18 DIAGNOSIS — Z48815 Encounter for surgical aftercare following surgery on the digestive system: Secondary | ICD-10-CM | POA: Diagnosis not present

## 2017-08-18 DIAGNOSIS — E119 Type 2 diabetes mellitus without complications: Secondary | ICD-10-CM | POA: Diagnosis not present

## 2017-08-18 DIAGNOSIS — H269 Unspecified cataract: Secondary | ICD-10-CM | POA: Diagnosis not present

## 2017-08-18 DIAGNOSIS — D649 Anemia, unspecified: Secondary | ICD-10-CM | POA: Diagnosis not present

## 2017-08-18 DIAGNOSIS — I10 Essential (primary) hypertension: Secondary | ICD-10-CM | POA: Diagnosis not present

## 2017-08-20 DIAGNOSIS — I10 Essential (primary) hypertension: Secondary | ICD-10-CM | POA: Diagnosis not present

## 2017-08-20 DIAGNOSIS — D649 Anemia, unspecified: Secondary | ICD-10-CM | POA: Diagnosis not present

## 2017-08-20 DIAGNOSIS — E119 Type 2 diabetes mellitus without complications: Secondary | ICD-10-CM | POA: Diagnosis not present

## 2017-08-20 DIAGNOSIS — H269 Unspecified cataract: Secondary | ICD-10-CM | POA: Diagnosis not present

## 2017-08-20 DIAGNOSIS — Z48815 Encounter for surgical aftercare following surgery on the digestive system: Secondary | ICD-10-CM | POA: Diagnosis not present

## 2017-08-23 DIAGNOSIS — E119 Type 2 diabetes mellitus without complications: Secondary | ICD-10-CM | POA: Diagnosis not present

## 2017-08-23 DIAGNOSIS — Z48815 Encounter for surgical aftercare following surgery on the digestive system: Secondary | ICD-10-CM | POA: Diagnosis not present

## 2017-08-23 DIAGNOSIS — I10 Essential (primary) hypertension: Secondary | ICD-10-CM | POA: Diagnosis not present

## 2017-08-23 DIAGNOSIS — D649 Anemia, unspecified: Secondary | ICD-10-CM | POA: Diagnosis not present

## 2017-08-23 DIAGNOSIS — H269 Unspecified cataract: Secondary | ICD-10-CM | POA: Diagnosis not present

## 2017-08-24 DIAGNOSIS — Z48815 Encounter for surgical aftercare following surgery on the digestive system: Secondary | ICD-10-CM | POA: Diagnosis not present

## 2017-08-24 DIAGNOSIS — H269 Unspecified cataract: Secondary | ICD-10-CM | POA: Diagnosis not present

## 2017-08-24 DIAGNOSIS — E119 Type 2 diabetes mellitus without complications: Secondary | ICD-10-CM | POA: Diagnosis not present

## 2017-08-24 DIAGNOSIS — I10 Essential (primary) hypertension: Secondary | ICD-10-CM | POA: Diagnosis not present

## 2017-08-24 DIAGNOSIS — D649 Anemia, unspecified: Secondary | ICD-10-CM | POA: Diagnosis not present

## 2017-08-25 DIAGNOSIS — H269 Unspecified cataract: Secondary | ICD-10-CM | POA: Diagnosis not present

## 2017-08-25 DIAGNOSIS — D649 Anemia, unspecified: Secondary | ICD-10-CM | POA: Diagnosis not present

## 2017-08-25 DIAGNOSIS — Z48815 Encounter for surgical aftercare following surgery on the digestive system: Secondary | ICD-10-CM | POA: Diagnosis not present

## 2017-08-25 DIAGNOSIS — E119 Type 2 diabetes mellitus without complications: Secondary | ICD-10-CM | POA: Diagnosis not present

## 2017-08-25 DIAGNOSIS — I10 Essential (primary) hypertension: Secondary | ICD-10-CM | POA: Diagnosis not present

## 2017-08-26 DIAGNOSIS — M48061 Spinal stenosis, lumbar region without neurogenic claudication: Secondary | ICD-10-CM | POA: Diagnosis not present

## 2017-08-26 DIAGNOSIS — H269 Unspecified cataract: Secondary | ICD-10-CM | POA: Diagnosis not present

## 2017-08-26 DIAGNOSIS — D649 Anemia, unspecified: Secondary | ICD-10-CM | POA: Diagnosis not present

## 2017-08-26 DIAGNOSIS — I10 Essential (primary) hypertension: Secondary | ICD-10-CM | POA: Diagnosis not present

## 2017-08-26 DIAGNOSIS — Z48815 Encounter for surgical aftercare following surgery on the digestive system: Secondary | ICD-10-CM | POA: Diagnosis not present

## 2017-08-26 DIAGNOSIS — E119 Type 2 diabetes mellitus without complications: Secondary | ICD-10-CM | POA: Diagnosis not present

## 2017-08-27 DIAGNOSIS — E119 Type 2 diabetes mellitus without complications: Secondary | ICD-10-CM | POA: Diagnosis not present

## 2017-08-27 DIAGNOSIS — I10 Essential (primary) hypertension: Secondary | ICD-10-CM | POA: Diagnosis not present

## 2017-08-27 DIAGNOSIS — Z48815 Encounter for surgical aftercare following surgery on the digestive system: Secondary | ICD-10-CM | POA: Diagnosis not present

## 2017-08-27 DIAGNOSIS — H269 Unspecified cataract: Secondary | ICD-10-CM | POA: Diagnosis not present

## 2017-08-27 DIAGNOSIS — D649 Anemia, unspecified: Secondary | ICD-10-CM | POA: Diagnosis not present

## 2017-08-30 DIAGNOSIS — I10 Essential (primary) hypertension: Secondary | ICD-10-CM | POA: Diagnosis not present

## 2017-08-30 DIAGNOSIS — H269 Unspecified cataract: Secondary | ICD-10-CM | POA: Diagnosis not present

## 2017-08-30 DIAGNOSIS — E119 Type 2 diabetes mellitus without complications: Secondary | ICD-10-CM | POA: Diagnosis not present

## 2017-08-30 DIAGNOSIS — D649 Anemia, unspecified: Secondary | ICD-10-CM | POA: Diagnosis not present

## 2017-08-30 DIAGNOSIS — Z48815 Encounter for surgical aftercare following surgery on the digestive system: Secondary | ICD-10-CM | POA: Diagnosis not present

## 2017-08-31 ENCOUNTER — Encounter: Payer: Self-pay | Admitting: Family Medicine

## 2017-08-31 ENCOUNTER — Ambulatory Visit (INDEPENDENT_AMBULATORY_CARE_PROVIDER_SITE_OTHER): Payer: Medicare Other | Admitting: Family Medicine

## 2017-08-31 VITALS — BP 136/72 | HR 79 | Temp 98.4°F | Resp 16 | Wt 184.0 lb

## 2017-08-31 DIAGNOSIS — T8131XA Disruption of external operation (surgical) wound, not elsewhere classified, initial encounter: Secondary | ICD-10-CM

## 2017-08-31 NOTE — Progress Notes (Signed)
   Subjective:    Patient ID: [name], female    DOB: 08-23-1932, 84 y.o.   MRN: [id number]  HPI Status post appendectomy/surgical wound dehiscence-84 year old female is here today to follow-up for a wound check.  Please see previous note from about 4 weeks ago.  Surgeon had actually released her so I taken over management of her home health services.  Home health nurse has been coming out every 2 to 3 days to clean the wound with the surgical scrub and they have been applying a gauze.  The wound is still draining some but overall looks much better than it did previously. She was able to clear of the yeast infection with the oral diflucan and she has been applying cornstarch under her pannus ( not on the wound).  Fevers chills or sweats.  Most of the drainage is either serous or bloody.  Review of Systems     Objective:   Physical Exam  Constitutional: She is oriented to person, place, and time. She appears well-developed and well-nourished.  HENT:  Head: Normocephalic and atraumatic.  Eyes: Conjunctivae and EOM are normal.  Cardiovascular: Normal rate.  Pulmonary/Chest: Effort normal.  Neurological: She is alert and oriented to person, place, and time.  Skin: Skin is dry. No pallor.  Incision is healing well. She does have some granulation tissue pressure that is causing most of the serous drainage.  No sign of significant erythema or cellulitis.  In the yeast infection has cleared.  Psychiatric: She has a normal mood and affect. Her behavior is normal.  Vitals reviewed.     Assessment & Plan:  Status post appendectomy/surgical wound dehiscence - continue home health wound care.  I did apply some of her nitrate to the granulation tissue.  It started to get uncomfortable so we did not do the complete incision but we did start part of it.  Xeroform placed on top and then gauze with an abdominal dressing.  We will have wound care continue to come out of one a recheck in 2 weeks just  to make sure that we are progressing.  Need to try repeating cauterization on the granulation tissue if it is not healing.

## 2017-09-01 DIAGNOSIS — I10 Essential (primary) hypertension: Secondary | ICD-10-CM | POA: Diagnosis not present

## 2017-09-01 DIAGNOSIS — H269 Unspecified cataract: Secondary | ICD-10-CM | POA: Diagnosis not present

## 2017-09-01 DIAGNOSIS — D649 Anemia, unspecified: Secondary | ICD-10-CM | POA: Diagnosis not present

## 2017-09-01 DIAGNOSIS — Z48815 Encounter for surgical aftercare following surgery on the digestive system: Secondary | ICD-10-CM | POA: Diagnosis not present

## 2017-09-01 DIAGNOSIS — E119 Type 2 diabetes mellitus without complications: Secondary | ICD-10-CM | POA: Diagnosis not present

## 2017-09-03 DIAGNOSIS — D649 Anemia, unspecified: Secondary | ICD-10-CM | POA: Diagnosis not present

## 2017-09-03 DIAGNOSIS — I10 Essential (primary) hypertension: Secondary | ICD-10-CM | POA: Diagnosis not present

## 2017-09-03 DIAGNOSIS — E119 Type 2 diabetes mellitus without complications: Secondary | ICD-10-CM | POA: Diagnosis not present

## 2017-09-03 DIAGNOSIS — Z48815 Encounter for surgical aftercare following surgery on the digestive system: Secondary | ICD-10-CM | POA: Diagnosis not present

## 2017-09-03 DIAGNOSIS — H269 Unspecified cataract: Secondary | ICD-10-CM | POA: Diagnosis not present

## 2017-09-06 DIAGNOSIS — Z48815 Encounter for surgical aftercare following surgery on the digestive system: Secondary | ICD-10-CM | POA: Diagnosis not present

## 2017-09-06 DIAGNOSIS — E119 Type 2 diabetes mellitus without complications: Secondary | ICD-10-CM | POA: Diagnosis not present

## 2017-09-06 DIAGNOSIS — D649 Anemia, unspecified: Secondary | ICD-10-CM | POA: Diagnosis not present

## 2017-09-06 DIAGNOSIS — H269 Unspecified cataract: Secondary | ICD-10-CM | POA: Diagnosis not present

## 2017-09-06 DIAGNOSIS — I10 Essential (primary) hypertension: Secondary | ICD-10-CM | POA: Diagnosis not present

## 2017-09-07 DIAGNOSIS — D649 Anemia, unspecified: Secondary | ICD-10-CM | POA: Diagnosis not present

## 2017-09-07 DIAGNOSIS — Z48815 Encounter for surgical aftercare following surgery on the digestive system: Secondary | ICD-10-CM | POA: Diagnosis not present

## 2017-09-07 DIAGNOSIS — I10 Essential (primary) hypertension: Secondary | ICD-10-CM | POA: Diagnosis not present

## 2017-09-07 DIAGNOSIS — E119 Type 2 diabetes mellitus without complications: Secondary | ICD-10-CM | POA: Diagnosis not present

## 2017-09-07 DIAGNOSIS — H269 Unspecified cataract: Secondary | ICD-10-CM | POA: Diagnosis not present

## 2017-09-08 DIAGNOSIS — I10 Essential (primary) hypertension: Secondary | ICD-10-CM | POA: Diagnosis not present

## 2017-09-08 DIAGNOSIS — H269 Unspecified cataract: Secondary | ICD-10-CM | POA: Diagnosis not present

## 2017-09-08 DIAGNOSIS — Z48815 Encounter for surgical aftercare following surgery on the digestive system: Secondary | ICD-10-CM | POA: Diagnosis not present

## 2017-09-08 DIAGNOSIS — D649 Anemia, unspecified: Secondary | ICD-10-CM | POA: Diagnosis not present

## 2017-09-08 DIAGNOSIS — E119 Type 2 diabetes mellitus without complications: Secondary | ICD-10-CM | POA: Diagnosis not present

## 2017-09-09 DIAGNOSIS — H269 Unspecified cataract: Secondary | ICD-10-CM | POA: Diagnosis not present

## 2017-09-09 DIAGNOSIS — D649 Anemia, unspecified: Secondary | ICD-10-CM | POA: Diagnosis not present

## 2017-09-09 DIAGNOSIS — I10 Essential (primary) hypertension: Secondary | ICD-10-CM | POA: Diagnosis not present

## 2017-09-09 DIAGNOSIS — Z48815 Encounter for surgical aftercare following surgery on the digestive system: Secondary | ICD-10-CM | POA: Diagnosis not present

## 2017-09-09 DIAGNOSIS — E119 Type 2 diabetes mellitus without complications: Secondary | ICD-10-CM | POA: Diagnosis not present

## 2017-09-10 DIAGNOSIS — E119 Type 2 diabetes mellitus without complications: Secondary | ICD-10-CM | POA: Diagnosis not present

## 2017-09-10 DIAGNOSIS — Z48815 Encounter for surgical aftercare following surgery on the digestive system: Secondary | ICD-10-CM | POA: Diagnosis not present

## 2017-09-10 DIAGNOSIS — D649 Anemia, unspecified: Secondary | ICD-10-CM | POA: Diagnosis not present

## 2017-09-10 DIAGNOSIS — H269 Unspecified cataract: Secondary | ICD-10-CM | POA: Diagnosis not present

## 2017-09-10 DIAGNOSIS — I10 Essential (primary) hypertension: Secondary | ICD-10-CM | POA: Diagnosis not present

## 2017-09-11 DIAGNOSIS — M6281 Muscle weakness (generalized): Secondary | ICD-10-CM | POA: Diagnosis not present

## 2017-09-11 DIAGNOSIS — D649 Anemia, unspecified: Secondary | ICD-10-CM | POA: Diagnosis not present

## 2017-09-11 DIAGNOSIS — E119 Type 2 diabetes mellitus without complications: Secondary | ICD-10-CM | POA: Diagnosis not present

## 2017-09-11 DIAGNOSIS — H269 Unspecified cataract: Secondary | ICD-10-CM | POA: Diagnosis not present

## 2017-09-11 DIAGNOSIS — I10 Essential (primary) hypertension: Secondary | ICD-10-CM | POA: Diagnosis not present

## 2017-09-11 DIAGNOSIS — R2689 Other abnormalities of gait and mobility: Secondary | ICD-10-CM | POA: Diagnosis not present

## 2017-09-11 DIAGNOSIS — Z48815 Encounter for surgical aftercare following surgery on the digestive system: Secondary | ICD-10-CM | POA: Diagnosis not present

## 2017-09-13 DIAGNOSIS — R2689 Other abnormalities of gait and mobility: Secondary | ICD-10-CM | POA: Diagnosis not present

## 2017-09-13 DIAGNOSIS — D649 Anemia, unspecified: Secondary | ICD-10-CM | POA: Diagnosis not present

## 2017-09-13 DIAGNOSIS — Z48815 Encounter for surgical aftercare following surgery on the digestive system: Secondary | ICD-10-CM | POA: Diagnosis not present

## 2017-09-13 DIAGNOSIS — M6281 Muscle weakness (generalized): Secondary | ICD-10-CM | POA: Diagnosis not present

## 2017-09-13 DIAGNOSIS — H269 Unspecified cataract: Secondary | ICD-10-CM | POA: Diagnosis not present

## 2017-09-13 DIAGNOSIS — E119 Type 2 diabetes mellitus without complications: Secondary | ICD-10-CM | POA: Diagnosis not present

## 2017-09-13 DIAGNOSIS — I10 Essential (primary) hypertension: Secondary | ICD-10-CM | POA: Diagnosis not present

## 2017-09-14 DIAGNOSIS — H269 Unspecified cataract: Secondary | ICD-10-CM | POA: Diagnosis not present

## 2017-09-14 DIAGNOSIS — Z48815 Encounter for surgical aftercare following surgery on the digestive system: Secondary | ICD-10-CM | POA: Diagnosis not present

## 2017-09-14 DIAGNOSIS — R2689 Other abnormalities of gait and mobility: Secondary | ICD-10-CM | POA: Diagnosis not present

## 2017-09-14 DIAGNOSIS — D649 Anemia, unspecified: Secondary | ICD-10-CM | POA: Diagnosis not present

## 2017-09-14 DIAGNOSIS — E119 Type 2 diabetes mellitus without complications: Secondary | ICD-10-CM | POA: Diagnosis not present

## 2017-09-14 DIAGNOSIS — I10 Essential (primary) hypertension: Secondary | ICD-10-CM | POA: Diagnosis not present

## 2017-09-14 DIAGNOSIS — M6281 Muscle weakness (generalized): Secondary | ICD-10-CM | POA: Diagnosis not present

## 2017-09-15 DIAGNOSIS — R2689 Other abnormalities of gait and mobility: Secondary | ICD-10-CM | POA: Diagnosis not present

## 2017-09-15 DIAGNOSIS — Z48815 Encounter for surgical aftercare following surgery on the digestive system: Secondary | ICD-10-CM | POA: Diagnosis not present

## 2017-09-15 DIAGNOSIS — M6281 Muscle weakness (generalized): Secondary | ICD-10-CM | POA: Diagnosis not present

## 2017-09-15 DIAGNOSIS — E119 Type 2 diabetes mellitus without complications: Secondary | ICD-10-CM | POA: Diagnosis not present

## 2017-09-15 DIAGNOSIS — I10 Essential (primary) hypertension: Secondary | ICD-10-CM | POA: Diagnosis not present

## 2017-09-15 DIAGNOSIS — H269 Unspecified cataract: Secondary | ICD-10-CM | POA: Diagnosis not present

## 2017-09-15 DIAGNOSIS — D649 Anemia, unspecified: Secondary | ICD-10-CM | POA: Diagnosis not present

## 2017-09-16 ENCOUNTER — Ambulatory Visit (INDEPENDENT_AMBULATORY_CARE_PROVIDER_SITE_OTHER): Payer: Medicare Other | Admitting: Family Medicine

## 2017-09-16 ENCOUNTER — Encounter: Payer: Self-pay | Admitting: Family Medicine

## 2017-09-16 VITALS — BP 128/74 | HR 106 | Ht 63.0 in | Wt 185.0 lb

## 2017-09-16 DIAGNOSIS — H269 Unspecified cataract: Secondary | ICD-10-CM | POA: Diagnosis not present

## 2017-09-16 DIAGNOSIS — Z23 Encounter for immunization: Secondary | ICD-10-CM

## 2017-09-16 DIAGNOSIS — D649 Anemia, unspecified: Secondary | ICD-10-CM | POA: Diagnosis not present

## 2017-09-16 DIAGNOSIS — M6281 Muscle weakness (generalized): Secondary | ICD-10-CM | POA: Diagnosis not present

## 2017-09-16 DIAGNOSIS — R2689 Other abnormalities of gait and mobility: Secondary | ICD-10-CM | POA: Diagnosis not present

## 2017-09-16 DIAGNOSIS — I10 Essential (primary) hypertension: Secondary | ICD-10-CM

## 2017-09-16 DIAGNOSIS — T8131XA Disruption of external operation (surgical) wound, not elsewhere classified, initial encounter: Secondary | ICD-10-CM

## 2017-09-16 DIAGNOSIS — E119 Type 2 diabetes mellitus without complications: Secondary | ICD-10-CM | POA: Diagnosis not present

## 2017-09-16 DIAGNOSIS — Z48815 Encounter for surgical aftercare following surgery on the digestive system: Secondary | ICD-10-CM | POA: Diagnosis not present

## 2017-09-16 NOTE — Progress Notes (Signed)
   Subjective:    Patient ID: [name], female    DOB: 08-08-1932, 84 y.o.   MRN: [id number]  HPI 84 year old female is here today for follow-up wound care.  Status post appendectomy she had a surgical wound dehiscence and we have been following her to make sure that the wound will close.  She is been getting some assistance through home health for wound management she is been cleaning it with a surgical scrub and then applying gauze.  Then in July with a concomitant yeast infection we were able to clear that up so the wound could actually start healing.  Did apply some silver nitrate to the granulation tissue about 2 weeks ago.  Continues to heal well.  She just has one little area but still just draining a little bit.  The rest of the wound is pretty much healed.  She also wanted to discuss her blood pressure.  She is actually been out of her metoprolol for about 3 days and her blood pressures actually looked really good.  And she is felt well.  Review of Systems     Objective:   Physical Exam  Constitutional: She is oriented to person, place, and time. She appears well-developed and well-nourished.  HENT:  Head: Normocephalic and atraumatic.  Eyes: Conjunctivae and EOM are normal.  Cardiovascular: Normal rate.  Pulmonary/Chest: Effort normal.  Neurological: She is alert and oriented to person, place, and time.  Skin: Skin is dry. No pallor.  Incision is overall well healed except for an approx 1 cm area of wet granulation tissues in the middle of the incision.   Psychiatric: She has a normal mood and affect. Her behavior is normal.  Vitals reviewed.      Assessment & Plan:  Surgical wound dehiscence-she still has a 1 cm length area that he is a little silver nitrate on today since it some excessive growth of granulation tissue.  I think this will help.  At this point I am not can have her follow-up again for a wound check unless she feels it is not continuing to  heal.  Hypertension-okay to cut metoprolol down to 50 mg twice a day.  Since her pulse is a little high I do think we may still need to keep some dose of the metoprolol.  But if she is doing really well with her blood pressure we might be able to get rid of the amlodipine.

## 2017-09-16 NOTE — Patient Instructions (Signed)
Cut the metoprolol in half and take half a tab twice a day. Keep an eye on blood pressure.

## 2017-09-17 DIAGNOSIS — H269 Unspecified cataract: Secondary | ICD-10-CM | POA: Diagnosis not present

## 2017-09-17 DIAGNOSIS — M6281 Muscle weakness (generalized): Secondary | ICD-10-CM | POA: Diagnosis not present

## 2017-09-17 DIAGNOSIS — R2689 Other abnormalities of gait and mobility: Secondary | ICD-10-CM | POA: Diagnosis not present

## 2017-09-17 DIAGNOSIS — D649 Anemia, unspecified: Secondary | ICD-10-CM | POA: Diagnosis not present

## 2017-09-17 DIAGNOSIS — I10 Essential (primary) hypertension: Secondary | ICD-10-CM | POA: Diagnosis not present

## 2017-09-17 DIAGNOSIS — E119 Type 2 diabetes mellitus without complications: Secondary | ICD-10-CM | POA: Diagnosis not present

## 2017-09-17 DIAGNOSIS — Z48815 Encounter for surgical aftercare following surgery on the digestive system: Secondary | ICD-10-CM | POA: Diagnosis not present

## 2017-09-20 DIAGNOSIS — E119 Type 2 diabetes mellitus without complications: Secondary | ICD-10-CM | POA: Diagnosis not present

## 2017-09-20 DIAGNOSIS — D649 Anemia, unspecified: Secondary | ICD-10-CM | POA: Diagnosis not present

## 2017-09-20 DIAGNOSIS — R2689 Other abnormalities of gait and mobility: Secondary | ICD-10-CM | POA: Diagnosis not present

## 2017-09-20 DIAGNOSIS — M6281 Muscle weakness (generalized): Secondary | ICD-10-CM | POA: Diagnosis not present

## 2017-09-20 DIAGNOSIS — H269 Unspecified cataract: Secondary | ICD-10-CM | POA: Diagnosis not present

## 2017-09-20 DIAGNOSIS — I10 Essential (primary) hypertension: Secondary | ICD-10-CM | POA: Diagnosis not present

## 2017-09-20 DIAGNOSIS — Z48815 Encounter for surgical aftercare following surgery on the digestive system: Secondary | ICD-10-CM | POA: Diagnosis not present

## 2017-09-21 DIAGNOSIS — E119 Type 2 diabetes mellitus without complications: Secondary | ICD-10-CM | POA: Diagnosis not present

## 2017-09-21 DIAGNOSIS — M6281 Muscle weakness (generalized): Secondary | ICD-10-CM | POA: Diagnosis not present

## 2017-09-21 DIAGNOSIS — R2689 Other abnormalities of gait and mobility: Secondary | ICD-10-CM | POA: Diagnosis not present

## 2017-09-21 DIAGNOSIS — Z48815 Encounter for surgical aftercare following surgery on the digestive system: Secondary | ICD-10-CM | POA: Diagnosis not present

## 2017-09-21 DIAGNOSIS — I10 Essential (primary) hypertension: Secondary | ICD-10-CM | POA: Diagnosis not present

## 2017-09-21 DIAGNOSIS — D649 Anemia, unspecified: Secondary | ICD-10-CM | POA: Diagnosis not present

## 2017-09-21 DIAGNOSIS — H269 Unspecified cataract: Secondary | ICD-10-CM | POA: Diagnosis not present

## 2017-09-22 DIAGNOSIS — R2689 Other abnormalities of gait and mobility: Secondary | ICD-10-CM | POA: Diagnosis not present

## 2017-09-22 DIAGNOSIS — D649 Anemia, unspecified: Secondary | ICD-10-CM | POA: Diagnosis not present

## 2017-09-22 DIAGNOSIS — H269 Unspecified cataract: Secondary | ICD-10-CM | POA: Diagnosis not present

## 2017-09-22 DIAGNOSIS — E119 Type 2 diabetes mellitus without complications: Secondary | ICD-10-CM | POA: Diagnosis not present

## 2017-09-22 DIAGNOSIS — M6281 Muscle weakness (generalized): Secondary | ICD-10-CM | POA: Diagnosis not present

## 2017-09-22 DIAGNOSIS — Z48815 Encounter for surgical aftercare following surgery on the digestive system: Secondary | ICD-10-CM | POA: Diagnosis not present

## 2017-09-22 DIAGNOSIS — I10 Essential (primary) hypertension: Secondary | ICD-10-CM | POA: Diagnosis not present

## 2017-09-24 DIAGNOSIS — H269 Unspecified cataract: Secondary | ICD-10-CM | POA: Diagnosis not present

## 2017-09-24 DIAGNOSIS — D649 Anemia, unspecified: Secondary | ICD-10-CM | POA: Diagnosis not present

## 2017-09-24 DIAGNOSIS — E119 Type 2 diabetes mellitus without complications: Secondary | ICD-10-CM | POA: Diagnosis not present

## 2017-09-24 DIAGNOSIS — M6281 Muscle weakness (generalized): Secondary | ICD-10-CM | POA: Diagnosis not present

## 2017-09-24 DIAGNOSIS — Z48815 Encounter for surgical aftercare following surgery on the digestive system: Secondary | ICD-10-CM | POA: Diagnosis not present

## 2017-09-24 DIAGNOSIS — R2689 Other abnormalities of gait and mobility: Secondary | ICD-10-CM | POA: Diagnosis not present

## 2017-09-24 DIAGNOSIS — I10 Essential (primary) hypertension: Secondary | ICD-10-CM | POA: Diagnosis not present

## 2017-09-26 DIAGNOSIS — M48061 Spinal stenosis, lumbar region without neurogenic claudication: Secondary | ICD-10-CM | POA: Diagnosis not present

## 2017-09-26 DIAGNOSIS — I10 Essential (primary) hypertension: Secondary | ICD-10-CM | POA: Diagnosis not present

## 2017-09-27 DIAGNOSIS — H269 Unspecified cataract: Secondary | ICD-10-CM | POA: Diagnosis not present

## 2017-09-27 DIAGNOSIS — I10 Essential (primary) hypertension: Secondary | ICD-10-CM | POA: Diagnosis not present

## 2017-09-27 DIAGNOSIS — Z48815 Encounter for surgical aftercare following surgery on the digestive system: Secondary | ICD-10-CM | POA: Diagnosis not present

## 2017-09-27 DIAGNOSIS — E119 Type 2 diabetes mellitus without complications: Secondary | ICD-10-CM | POA: Diagnosis not present

## 2017-09-27 DIAGNOSIS — M6281 Muscle weakness (generalized): Secondary | ICD-10-CM | POA: Diagnosis not present

## 2017-09-27 DIAGNOSIS — D649 Anemia, unspecified: Secondary | ICD-10-CM | POA: Diagnosis not present

## 2017-09-27 DIAGNOSIS — R2689 Other abnormalities of gait and mobility: Secondary | ICD-10-CM | POA: Diagnosis not present

## 2017-09-28 DIAGNOSIS — M6281 Muscle weakness (generalized): Secondary | ICD-10-CM | POA: Diagnosis not present

## 2017-09-28 DIAGNOSIS — I10 Essential (primary) hypertension: Secondary | ICD-10-CM | POA: Diagnosis not present

## 2017-09-28 DIAGNOSIS — D649 Anemia, unspecified: Secondary | ICD-10-CM | POA: Diagnosis not present

## 2017-09-28 DIAGNOSIS — H269 Unspecified cataract: Secondary | ICD-10-CM | POA: Diagnosis not present

## 2017-09-28 DIAGNOSIS — Z48815 Encounter for surgical aftercare following surgery on the digestive system: Secondary | ICD-10-CM | POA: Diagnosis not present

## 2017-09-28 DIAGNOSIS — E119 Type 2 diabetes mellitus without complications: Secondary | ICD-10-CM | POA: Diagnosis not present

## 2017-09-28 DIAGNOSIS — R2689 Other abnormalities of gait and mobility: Secondary | ICD-10-CM | POA: Diagnosis not present

## 2017-09-29 DIAGNOSIS — E119 Type 2 diabetes mellitus without complications: Secondary | ICD-10-CM | POA: Diagnosis not present

## 2017-09-29 DIAGNOSIS — M6281 Muscle weakness (generalized): Secondary | ICD-10-CM | POA: Diagnosis not present

## 2017-09-29 DIAGNOSIS — H269 Unspecified cataract: Secondary | ICD-10-CM | POA: Diagnosis not present

## 2017-09-29 DIAGNOSIS — R2689 Other abnormalities of gait and mobility: Secondary | ICD-10-CM | POA: Diagnosis not present

## 2017-09-29 DIAGNOSIS — I10 Essential (primary) hypertension: Secondary | ICD-10-CM | POA: Diagnosis not present

## 2017-09-29 DIAGNOSIS — Z48815 Encounter for surgical aftercare following surgery on the digestive system: Secondary | ICD-10-CM | POA: Diagnosis not present

## 2017-09-29 DIAGNOSIS — D649 Anemia, unspecified: Secondary | ICD-10-CM | POA: Diagnosis not present

## 2017-10-01 DIAGNOSIS — Z48815 Encounter for surgical aftercare following surgery on the digestive system: Secondary | ICD-10-CM | POA: Diagnosis not present

## 2017-10-01 DIAGNOSIS — D649 Anemia, unspecified: Secondary | ICD-10-CM | POA: Diagnosis not present

## 2017-10-01 DIAGNOSIS — R2689 Other abnormalities of gait and mobility: Secondary | ICD-10-CM | POA: Diagnosis not present

## 2017-10-01 DIAGNOSIS — I10 Essential (primary) hypertension: Secondary | ICD-10-CM | POA: Diagnosis not present

## 2017-10-01 DIAGNOSIS — E119 Type 2 diabetes mellitus without complications: Secondary | ICD-10-CM | POA: Diagnosis not present

## 2017-10-01 DIAGNOSIS — M6281 Muscle weakness (generalized): Secondary | ICD-10-CM | POA: Diagnosis not present

## 2017-10-01 DIAGNOSIS — H269 Unspecified cataract: Secondary | ICD-10-CM | POA: Diagnosis not present

## 2017-10-04 DIAGNOSIS — H269 Unspecified cataract: Secondary | ICD-10-CM | POA: Diagnosis not present

## 2017-10-04 DIAGNOSIS — M6281 Muscle weakness (generalized): Secondary | ICD-10-CM | POA: Diagnosis not present

## 2017-10-04 DIAGNOSIS — I10 Essential (primary) hypertension: Secondary | ICD-10-CM | POA: Diagnosis not present

## 2017-10-04 DIAGNOSIS — E119 Type 2 diabetes mellitus without complications: Secondary | ICD-10-CM | POA: Diagnosis not present

## 2017-10-04 DIAGNOSIS — D649 Anemia, unspecified: Secondary | ICD-10-CM | POA: Diagnosis not present

## 2017-10-04 DIAGNOSIS — Z48815 Encounter for surgical aftercare following surgery on the digestive system: Secondary | ICD-10-CM | POA: Diagnosis not present

## 2017-10-04 DIAGNOSIS — R2689 Other abnormalities of gait and mobility: Secondary | ICD-10-CM | POA: Diagnosis not present

## 2017-10-06 DIAGNOSIS — I10 Essential (primary) hypertension: Secondary | ICD-10-CM | POA: Diagnosis not present

## 2017-10-06 DIAGNOSIS — R2689 Other abnormalities of gait and mobility: Secondary | ICD-10-CM | POA: Diagnosis not present

## 2017-10-06 DIAGNOSIS — H269 Unspecified cataract: Secondary | ICD-10-CM | POA: Diagnosis not present

## 2017-10-06 DIAGNOSIS — E119 Type 2 diabetes mellitus without complications: Secondary | ICD-10-CM | POA: Diagnosis not present

## 2017-10-06 DIAGNOSIS — Z48815 Encounter for surgical aftercare following surgery on the digestive system: Secondary | ICD-10-CM | POA: Diagnosis not present

## 2017-10-06 DIAGNOSIS — D649 Anemia, unspecified: Secondary | ICD-10-CM | POA: Diagnosis not present

## 2017-10-06 DIAGNOSIS — M6281 Muscle weakness (generalized): Secondary | ICD-10-CM | POA: Diagnosis not present

## 2017-10-08 DIAGNOSIS — R2689 Other abnormalities of gait and mobility: Secondary | ICD-10-CM | POA: Diagnosis not present

## 2017-10-08 DIAGNOSIS — E119 Type 2 diabetes mellitus without complications: Secondary | ICD-10-CM | POA: Diagnosis not present

## 2017-10-08 DIAGNOSIS — H269 Unspecified cataract: Secondary | ICD-10-CM | POA: Diagnosis not present

## 2017-10-08 DIAGNOSIS — M6281 Muscle weakness (generalized): Secondary | ICD-10-CM | POA: Diagnosis not present

## 2017-10-08 DIAGNOSIS — Z48815 Encounter for surgical aftercare following surgery on the digestive system: Secondary | ICD-10-CM | POA: Diagnosis not present

## 2017-10-08 DIAGNOSIS — D649 Anemia, unspecified: Secondary | ICD-10-CM | POA: Diagnosis not present

## 2017-10-08 DIAGNOSIS — I10 Essential (primary) hypertension: Secondary | ICD-10-CM | POA: Diagnosis not present

## 2017-10-11 DIAGNOSIS — E119 Type 2 diabetes mellitus without complications: Secondary | ICD-10-CM | POA: Diagnosis not present

## 2017-10-11 DIAGNOSIS — H269 Unspecified cataract: Secondary | ICD-10-CM | POA: Diagnosis not present

## 2017-10-11 DIAGNOSIS — R2689 Other abnormalities of gait and mobility: Secondary | ICD-10-CM | POA: Diagnosis not present

## 2017-10-11 DIAGNOSIS — D649 Anemia, unspecified: Secondary | ICD-10-CM | POA: Diagnosis not present

## 2017-10-11 DIAGNOSIS — M6281 Muscle weakness (generalized): Secondary | ICD-10-CM | POA: Diagnosis not present

## 2017-10-11 DIAGNOSIS — Z48815 Encounter for surgical aftercare following surgery on the digestive system: Secondary | ICD-10-CM | POA: Diagnosis not present

## 2017-10-11 DIAGNOSIS — I10 Essential (primary) hypertension: Secondary | ICD-10-CM | POA: Diagnosis not present

## 2017-10-13 DIAGNOSIS — E119 Type 2 diabetes mellitus without complications: Secondary | ICD-10-CM | POA: Diagnosis not present

## 2017-10-13 DIAGNOSIS — S0003XA Contusion of scalp, initial encounter: Secondary | ICD-10-CM | POA: Diagnosis not present

## 2017-10-13 DIAGNOSIS — Z48815 Encounter for surgical aftercare following surgery on the digestive system: Secondary | ICD-10-CM | POA: Diagnosis not present

## 2017-10-13 DIAGNOSIS — G8911 Acute pain due to trauma: Secondary | ICD-10-CM | POA: Diagnosis not present

## 2017-10-13 DIAGNOSIS — Z888 Allergy status to other drugs, medicaments and biological substances status: Secondary | ICD-10-CM | POA: Diagnosis not present

## 2017-10-13 DIAGNOSIS — S0083XA Contusion of other part of head, initial encounter: Secondary | ICD-10-CM | POA: Diagnosis not present

## 2017-10-13 DIAGNOSIS — R2689 Other abnormalities of gait and mobility: Secondary | ICD-10-CM | POA: Diagnosis not present

## 2017-10-13 DIAGNOSIS — I10 Essential (primary) hypertension: Secondary | ICD-10-CM | POA: Diagnosis not present

## 2017-10-13 DIAGNOSIS — Z91040 Latex allergy status: Secondary | ICD-10-CM | POA: Diagnosis not present

## 2017-10-13 DIAGNOSIS — D649 Anemia, unspecified: Secondary | ICD-10-CM | POA: Diagnosis not present

## 2017-10-13 DIAGNOSIS — I959 Hypotension, unspecified: Secondary | ICD-10-CM | POA: Diagnosis not present

## 2017-10-13 DIAGNOSIS — Z882 Allergy status to sulfonamides status: Secondary | ICD-10-CM | POA: Diagnosis not present

## 2017-10-13 DIAGNOSIS — Z885 Allergy status to narcotic agent status: Secondary | ICD-10-CM | POA: Diagnosis not present

## 2017-10-13 DIAGNOSIS — G9389 Other specified disorders of brain: Secondary | ICD-10-CM | POA: Diagnosis not present

## 2017-10-13 DIAGNOSIS — Z7982 Long term (current) use of aspirin: Secondary | ICD-10-CM | POA: Diagnosis not present

## 2017-10-13 DIAGNOSIS — Z743 Need for continuous supervision: Secondary | ICD-10-CM | POA: Diagnosis not present

## 2017-10-13 DIAGNOSIS — H269 Unspecified cataract: Secondary | ICD-10-CM | POA: Diagnosis not present

## 2017-10-13 DIAGNOSIS — S0990XA Unspecified injury of head, initial encounter: Secondary | ICD-10-CM | POA: Diagnosis not present

## 2017-10-13 DIAGNOSIS — W19XXXA Unspecified fall, initial encounter: Secondary | ICD-10-CM | POA: Diagnosis not present

## 2017-10-13 DIAGNOSIS — M329 Systemic lupus erythematosus, unspecified: Secondary | ICD-10-CM | POA: Diagnosis not present

## 2017-10-13 DIAGNOSIS — M6281 Muscle weakness (generalized): Secondary | ICD-10-CM | POA: Diagnosis not present

## 2017-10-13 DIAGNOSIS — Z79899 Other long term (current) drug therapy: Secondary | ICD-10-CM | POA: Diagnosis not present

## 2017-10-14 DIAGNOSIS — S0003XA Contusion of scalp, initial encounter: Secondary | ICD-10-CM | POA: Diagnosis not present

## 2017-10-14 DIAGNOSIS — G9389 Other specified disorders of brain: Secondary | ICD-10-CM | POA: Diagnosis not present

## 2017-10-15 DIAGNOSIS — Z48815 Encounter for surgical aftercare following surgery on the digestive system: Secondary | ICD-10-CM | POA: Diagnosis not present

## 2017-10-15 DIAGNOSIS — H269 Unspecified cataract: Secondary | ICD-10-CM | POA: Diagnosis not present

## 2017-10-15 DIAGNOSIS — D649 Anemia, unspecified: Secondary | ICD-10-CM | POA: Diagnosis not present

## 2017-10-15 DIAGNOSIS — M6281 Muscle weakness (generalized): Secondary | ICD-10-CM | POA: Diagnosis not present

## 2017-10-15 DIAGNOSIS — E119 Type 2 diabetes mellitus without complications: Secondary | ICD-10-CM | POA: Diagnosis not present

## 2017-10-15 DIAGNOSIS — I10 Essential (primary) hypertension: Secondary | ICD-10-CM | POA: Diagnosis not present

## 2017-10-15 DIAGNOSIS — R2689 Other abnormalities of gait and mobility: Secondary | ICD-10-CM | POA: Diagnosis not present

## 2017-10-18 DIAGNOSIS — I10 Essential (primary) hypertension: Secondary | ICD-10-CM | POA: Diagnosis not present

## 2017-10-18 DIAGNOSIS — E119 Type 2 diabetes mellitus without complications: Secondary | ICD-10-CM | POA: Diagnosis not present

## 2017-10-18 DIAGNOSIS — Z48815 Encounter for surgical aftercare following surgery on the digestive system: Secondary | ICD-10-CM | POA: Diagnosis not present

## 2017-10-18 DIAGNOSIS — M6281 Muscle weakness (generalized): Secondary | ICD-10-CM | POA: Diagnosis not present

## 2017-10-18 DIAGNOSIS — R2689 Other abnormalities of gait and mobility: Secondary | ICD-10-CM | POA: Diagnosis not present

## 2017-10-18 DIAGNOSIS — D649 Anemia, unspecified: Secondary | ICD-10-CM | POA: Diagnosis not present

## 2017-10-18 DIAGNOSIS — H269 Unspecified cataract: Secondary | ICD-10-CM | POA: Diagnosis not present

## 2017-10-19 ENCOUNTER — Encounter: Payer: Self-pay | Admitting: Family Medicine

## 2017-10-19 ENCOUNTER — Ambulatory Visit (INDEPENDENT_AMBULATORY_CARE_PROVIDER_SITE_OTHER): Payer: Medicare Other | Admitting: Family Medicine

## 2017-10-19 VITALS — BP 143/77 | HR 68

## 2017-10-19 DIAGNOSIS — Z9181 History of falling: Secondary | ICD-10-CM | POA: Diagnosis not present

## 2017-10-19 DIAGNOSIS — I10 Essential (primary) hypertension: Secondary | ICD-10-CM

## 2017-10-19 DIAGNOSIS — T8131XA Disruption of external operation (surgical) wound, not elsewhere classified, initial encounter: Secondary | ICD-10-CM | POA: Diagnosis not present

## 2017-10-19 DIAGNOSIS — S0083XA Contusion of other part of head, initial encounter: Secondary | ICD-10-CM | POA: Diagnosis not present

## 2017-10-19 MED ORDER — TELMISARTAN 40 MG PO TABS: 40.0000 mg | ORAL_TABLET | Freq: Every day | ORAL | 1 refills | Status: DC

## 2017-10-19 NOTE — Patient Instructions (Signed)
Cut your metoprolol in half. Take a half a tab twice a day.

## 2017-10-19 NOTE — Progress Notes (Signed)
Subjective:    Patient ID: [name], female    DOB: November 06, 1932, 84 y.o.   MRN: [id number]  HPI   84 year old female is here today after recent fall.  He actually fell at home hitting her head on the edge of the wheelchair on October 2.  She did go to the emergency department for further evaluation to [facility] ED.  She had a negative Head CT.  She says her face really does not hurt much.  She has not had any headaches since she fell.  She has not had any vision changes in fact she says she sees better out of her left eye now.   She also wanted me to look at her surgical wound on her abdomen.  Is taken several months for it to completely heal.  She is still covering it with gauze and tape and wanted me to look at today today.  Hypertension- Pt denies chest pain, SOB, dizziness, or heart palpitations.  Taking meds as directed w/o problems.  Denies medication side effects.  Her blood pressure was actually a little on the lower side and she actually forgot to take her metoprolol when I last saw her.  And encouraged her to start cutting the tabs in half but she did not she picked up the new prescription and has been taking a whole 100 mg tab.    Review of Systems  BP (!) 143/77   Pulse 68   SpO2 100%     Allergies  Allergen Reactions  . Aspirin Other (See Comments)    ulcer  . Gabapentin Hives and Itching  . Oxycodone Other (See Comments)    Pt unsure  . Simvastatin Other (See Comments)    Lost the use of her legs  . Sulfonamide Derivatives Hives and Itching  . Amlodipine Other (See Comments)    Pedal edema  . Atorvastatin   . Azithromycin   . Beta Adrenergic Blockers   . Captopril   . Codeine   . Fluvastatin Sodium   . Hydrochlorothiazide Other (See Comments)    INcreaseed BUN/Creatinine  . Latex   . Lisinopril Other (See Comments)    Increased BUN/CR  . Lovastatin   . Macrolides And Ketolides   . Rosiglitazone     Past Medical History:  Diagnosis Date  .  Adenocarcinoma ([place])    breast, right  . Anemia    iron deficiency- NOS  . Anxiety   . Atrial fibrillation ([place])    setting of pneumonia  . DDD (degenerative disc disease), lumbar    Dr Owens Shark  . Depression   . Diabetic retinopathy    Dr Jodi Mourning  . Discoid lupus   . Gastric ulcer   . Headache(784.0)    chronic  . Hemorrhoids   . Hyperlipidemia   . Hypertension    oral meds  . IBS (irritable bowel syndrome)   . Lumbar disc disease   . Lupus erythematosus    discoid  . Morton neuroma    right- Dr Wardell Honour  . Peripheral neuropathy   . PUD (peptic ulcer disease)   . Renal insufficiency     Past Surgical History:  Procedure Laterality Date  . ABDOMINAL HYSTERECTOMY     1 oophorectomy  . CHOLECYSTECTOMY  08/2005  . LUMBAR LAMINECTOMY    . right partial mastectomy w/sentinel lymph node biopsy  11-09  . TOTAL KNEE ARTHROPLASTY     both- Dr Devona Konig    Social History   Socioeconomic History  .  Marital status: Widowed    Spouse name: Not on file  . Number of children: 3  . Years of education: Not on file  . Highest education level: Not on file  Occupational History  . Occupation: Retired  Scientific laboratory technician  . Financial resource strain: Not on file  . Food insecurity:    Worry: Not on file    Inability: Not on file  . Transportation needs:    Medical: Not on file    Non-medical: Not on file  Tobacco Use  . Smoking status: Never Smoker  . Smokeless tobacco: Never Used  Substance and Sexual Activity  . Alcohol use: Yes    Alcohol/week: 1.0 standard drinks    Types: 1 Glasses of wine per week    Comment: occasional  . Drug use: No  . Sexual activity: Not on file  Lifestyle  . Physical activity:    Days per week: Not on file    Minutes per session: Not on file  . Stress: Not on file  Relationships  . Social connections:    Talks on phone: Not on file    Gets together: Not on file    Attends religious service: Not on file    Active member of club or  organization: Not on file    Attends meetings of clubs or organizations: Not on file    Relationship status: Not on file  . Intimate partner violence:    Fear of current or ex partner: Not on file    Emotionally abused: Not on file    Physically abused: Not on file    Forced sexual activity: Not on file  Other Topics Concern  . Not on file  Social History Narrative  . Not on file    Family History  Problem Relation Age of Onset  . Diabetes Mother   . Angina Mother   . COPD Father        colon  . Diabetes Father   . Stroke Father   . Hyperlipidemia Father   . Colon cancer Father     Outpatient Encounter Medications as of 10/19/2017  Medication Sig  . AMBULATORY NON FORMULARY MEDICATION Medication Name: Wheelchair, [place] with brakes.  Dx. Lower extremity weakness.  Marland Kitchen amLODipine (NORVASC) 2.5 MG tablet Take 1 tablet (2.5 mg total) by mouth daily.  Marland Kitchen aspirin EC 81 MG tablet Take 1 tablet (81 mg total) by mouth daily.  . Calcium Carbonate-Vitamin D (CALTRATE 600+D) 600-400 MG-UNIT per tablet Take 1 tablet by mouth 2 (two) times daily.   . DULoxetine (CYMBALTA) 60 MG capsule Take 1 capsule (60 mg total) by mouth daily. Will be due for refill next month  . furosemide (LASIX) 20 MG tablet Take 1 tablet (20 mg total) by mouth daily as needed for edema.  . hydroxychloroquine (PLAQUENIL) 200 MG tablet Take 200 mg by mouth daily. Take two  By mouth daily  . Ibuprofen 200 MG CAPS Take by mouth.  . metoprolol tartrate (LOPRESSOR) 100 MG tablet Take 1 tablet (100 mg total) by mouth 2 (two) times daily.  . solifenacin (VESICARE) 5 MG tablet Take 1 tablet (5 mg total) by mouth daily. If to expensive please call pt:  . sucralfate (CARAFATE) 1 g tablet Take 1 tablet (1 g total) by mouth 2 (two) times daily.  Marland Kitchen telmisartan (MICARDIS) 40 MG tablet Take 1 tablet (40 mg total) by mouth daily.  . vitamin B-12 (CYANOCOBALAMIN) 500 MCG tablet Take 1 tablet (500 mcg total) by  mouth daily.   No  facility-administered encounter medications on file as of 10/19/2017.          Objective:   Physical Exam  Constitutional: She is oriented to person, place, and time. She appears well-developed and well-nourished.  HENT:  Head: Normocephalic and atraumatic.  Eyes: Conjunctivae and EOM are normal.  Cardiovascular: Normal rate.  Pulmonary/Chest: Effort normal.  Neurological: She is alert and oriented to person, place, and time.  Skin: Skin is dry. No pallor.  Does have a small laceration to her right upper scalp.  It appears to be healing well she has significant bruising around both eyes both facial cheeks and forehead.  Has some bruising on the dorsum of her left hand.  Psychiatric: She has a normal mood and affect. Her behavior is normal.  Vitals reviewed.      Assessment & Plan:  Recent fall  -encouraged her to continue her physical therapy to work on lower extremity strengthening.  She has moved the rails away from her bed for safety reasons.  Mostly using a wheelchair.  Hypertension-blood pressure is up just a little bit but it looked great when she was here last time and it actually looked great when she was in the emergency department so it is good to continue with having her cut her metoprolol in half.  Took a sharpie marker and wrote on her bottle to remind her to take half a tab twice a day.  Surgical wound dehiscence-the wound has completely closed at this point.  She no longer needs to place any gauze or tape over it.  We will send a note to home health discontinuing wound care it looks fantastic just regular soap and water in the shower pat dry.  She could certainly use a scar cream on it at this point if she would like but does not need to.  Contusions to face-they seem to be healing well she wanted to tell when she would be able to get up her room and I told her in about 2 weeks.  That we would give this time for the small laceration on her scalp to heal.

## 2017-10-19 NOTE — Addendum Note (Signed)
Addended by: Teddy Spike on: 10/19/2017 05:11 PM   Modules accepted: Orders

## 2017-10-20 DIAGNOSIS — Z48815 Encounter for surgical aftercare following surgery on the digestive system: Secondary | ICD-10-CM | POA: Diagnosis not present

## 2017-10-20 DIAGNOSIS — D649 Anemia, unspecified: Secondary | ICD-10-CM | POA: Diagnosis not present

## 2017-10-20 DIAGNOSIS — I10 Essential (primary) hypertension: Secondary | ICD-10-CM | POA: Diagnosis not present

## 2017-10-20 DIAGNOSIS — R2689 Other abnormalities of gait and mobility: Secondary | ICD-10-CM | POA: Diagnosis not present

## 2017-10-20 DIAGNOSIS — H269 Unspecified cataract: Secondary | ICD-10-CM | POA: Diagnosis not present

## 2017-10-20 DIAGNOSIS — E119 Type 2 diabetes mellitus without complications: Secondary | ICD-10-CM | POA: Diagnosis not present

## 2017-10-20 DIAGNOSIS — M6281 Muscle weakness (generalized): Secondary | ICD-10-CM | POA: Diagnosis not present

## 2017-10-26 DIAGNOSIS — M48061 Spinal stenosis, lumbar region without neurogenic claudication: Secondary | ICD-10-CM | POA: Diagnosis not present

## 2017-10-26 DIAGNOSIS — I10 Essential (primary) hypertension: Secondary | ICD-10-CM | POA: Diagnosis not present

## 2017-10-27 DIAGNOSIS — H269 Unspecified cataract: Secondary | ICD-10-CM | POA: Diagnosis not present

## 2017-10-27 DIAGNOSIS — R2689 Other abnormalities of gait and mobility: Secondary | ICD-10-CM | POA: Diagnosis not present

## 2017-10-27 DIAGNOSIS — M6281 Muscle weakness (generalized): Secondary | ICD-10-CM | POA: Diagnosis not present

## 2017-10-27 DIAGNOSIS — E119 Type 2 diabetes mellitus without complications: Secondary | ICD-10-CM | POA: Diagnosis not present

## 2017-10-27 DIAGNOSIS — I10 Essential (primary) hypertension: Secondary | ICD-10-CM | POA: Diagnosis not present

## 2017-10-27 DIAGNOSIS — D649 Anemia, unspecified: Secondary | ICD-10-CM | POA: Diagnosis not present

## 2017-10-27 DIAGNOSIS — Z48815 Encounter for surgical aftercare following surgery on the digestive system: Secondary | ICD-10-CM | POA: Diagnosis not present

## 2017-10-28 ENCOUNTER — Ambulatory Visit: Payer: Medicare Other | Admitting: Family Medicine

## 2017-11-26 DIAGNOSIS — M48061 Spinal stenosis, lumbar region without neurogenic claudication: Secondary | ICD-10-CM | POA: Diagnosis not present

## 2017-11-26 DIAGNOSIS — I10 Essential (primary) hypertension: Secondary | ICD-10-CM | POA: Diagnosis not present

## 2017-12-01 ENCOUNTER — Telehealth: Payer: Self-pay

## 2017-12-01 NOTE — Telephone Encounter (Signed)
Pt called back- she checked her medications and states that Vesicare was the medication that was not affordable and she has been taking JUST Oxybutynin.  I called [facility] and they confirmed that Vesicare has NOT been filled for pt- she last filled Oxybutynin 09/14/17 for a 90 day supply.  Advised pt if that is affordable, continue with the Oxybutynin and do not fill Vesicare.   Med list corrected on chart and I called [facility] to discontinue Vesicare so there is no confusion   FYI to PCP

## 2017-12-01 NOTE — Telephone Encounter (Signed)
Pt called confused as to why her Oxybutynin was not on her current med list. Per office note from 06-14-17, pt's Oxybutynin was no longer covered by insurance and was switched to [employer].   I explained change to patient and she notes this was around the same time as her appendix issues, so she did not remember to switch medications. She has been taking both Vesicare 5mg  and Oxybutynin 5mg  QD for last 5 months. I have advised pt to discontinue her Oxybutynin and only take Vesicare. Pt agreeable and understands.   FYI to PCP- any further action needed?

## 2017-12-21 ENCOUNTER — Other Ambulatory Visit: Payer: Self-pay | Admitting: Family Medicine

## 2017-12-26 DIAGNOSIS — M48061 Spinal stenosis, lumbar region without neurogenic claudication: Secondary | ICD-10-CM | POA: Diagnosis not present

## 2017-12-26 DIAGNOSIS — I10 Essential (primary) hypertension: Secondary | ICD-10-CM | POA: Diagnosis not present

## 2018-01-17 ENCOUNTER — Other Ambulatory Visit: Payer: Self-pay | Admitting: Family Medicine

## 2018-01-21 ENCOUNTER — Encounter: Payer: Self-pay | Admitting: Family Medicine

## 2018-01-21 ENCOUNTER — Ambulatory Visit (INDEPENDENT_AMBULATORY_CARE_PROVIDER_SITE_OTHER): Payer: Medicare Other | Admitting: Family Medicine

## 2018-01-21 VITALS — BP 140/68 | HR 91 | Ht 63.0 in | Wt 187.0 lb

## 2018-01-21 DIAGNOSIS — R7301 Impaired fasting glucose: Secondary | ICD-10-CM

## 2018-01-21 DIAGNOSIS — N3281 Overactive bladder: Secondary | ICD-10-CM | POA: Diagnosis not present

## 2018-01-21 DIAGNOSIS — R29898 Other symptoms and signs involving the musculoskeletal system: Secondary | ICD-10-CM

## 2018-01-21 DIAGNOSIS — I480 Paroxysmal atrial fibrillation: Secondary | ICD-10-CM

## 2018-01-21 DIAGNOSIS — I10 Essential (primary) hypertension: Secondary | ICD-10-CM | POA: Diagnosis not present

## 2018-01-21 DIAGNOSIS — G609 Hereditary and idiopathic neuropathy, unspecified: Secondary | ICD-10-CM

## 2018-01-21 LAB — POCT GLYCOSYLATED HEMOGLOBIN (HGB A1C): Hemoglobin A1C: 5.4 % (ref 4.0–5.6)

## 2018-01-21 MED ORDER — METOPROLOL TARTRATE 50 MG PO TABS: 50.0000 mg | ORAL_TABLET | Freq: Two times a day (BID) | ORAL | 0 refills | Status: DC

## 2018-01-21 NOTE — Progress Notes (Signed)
Subjective:    CC:   HPI:  Hypertension- Pt denies chest pain, SOB, dizziness, or heart palpitations.  Taking meds as directed w/o problems.  Denies medication side effects.  Ports her home blood pressures have been running in the 130s to up to 140. She has been swelling more in her left ankle recently but hasn't been taking the lasix.    F/U Afib -recent chest pain or shortness of breath.  Impaired fasting glucose-no increased thirst or urination. No symptoms consistent with hypoglycemia.  OAB-she is doing well on her oxybutynin.  She feels like it works well is not having any side effects or problems.  She is also interested in getting some therapy to help with strengthening her legs they feel weak and she also has some neuropathy.  Past medical history, Surgical history, Family history not pertinant except as noted below, Social history, Allergies, and medications have been entered into the medical record, reviewed, and corrections made.   Review of Systems: No fevers, chills, night sweats, weight loss, chest pain, or shortness of breath.   Objective:    General: Well Developed, well nourished, and in no acute distress.  Neuro: Alert and oriented x3, extra-ocular muscles intact, sensation grossly intact.  HEENT: Normocephalic, atraumatic  Skin: Warm and dry, no rashes. Cardiac: Regular rate and rhythm, no murmurs rubs or gallops, no lower extremity edema.  Respiratory: Clear to auscultation bilaterally. Not using accessory muscles, speaking in full sentences.   Impression and Recommendations:    HTN - Well controlled. Continue current regimen. Follow up in  6 months.  Due for refill on her metoprolol working to change to 50 mg twice a day so she does not have to split them. F/U in 4 months.    Afib - Stable.    IFG - Well controlled. Continue current regimen. Follow up in  6 months.    Lab Results  Component Value Date   HGBA1C 5.4 01/21/2018   OAB -Stable.  Continue  oxybutynin.  Extremity weakness/neuropathy-new order for home health.  She says the last time they came out the physical therapist who came out was not very helpful and he was not very reliable.  She would like to try a new company if possible.

## 2018-01-26 ENCOUNTER — Telehealth: Payer: Self-pay

## 2018-01-26 DIAGNOSIS — I739 Peripheral vascular disease, unspecified: Secondary | ICD-10-CM | POA: Diagnosis not present

## 2018-01-26 DIAGNOSIS — D509 Iron deficiency anemia, unspecified: Secondary | ICD-10-CM | POA: Diagnosis not present

## 2018-01-26 DIAGNOSIS — I129 Hypertensive chronic kidney disease with stage 1 through stage 4 chronic kidney disease, or unspecified chronic kidney disease: Secondary | ICD-10-CM | POA: Diagnosis not present

## 2018-01-26 DIAGNOSIS — Z9181 History of falling: Secondary | ICD-10-CM | POA: Diagnosis not present

## 2018-01-26 DIAGNOSIS — E785 Hyperlipidemia, unspecified: Secondary | ICD-10-CM | POA: Diagnosis not present

## 2018-01-26 DIAGNOSIS — M329 Systemic lupus erythematosus, unspecified: Secondary | ICD-10-CM | POA: Diagnosis not present

## 2018-01-26 DIAGNOSIS — M48061 Spinal stenosis, lumbar region without neurogenic claudication: Secondary | ICD-10-CM | POA: Diagnosis not present

## 2018-01-26 DIAGNOSIS — I10 Essential (primary) hypertension: Secondary | ICD-10-CM | POA: Diagnosis not present

## 2018-01-26 DIAGNOSIS — R7301 Impaired fasting glucose: Secondary | ICD-10-CM | POA: Diagnosis not present

## 2018-01-26 DIAGNOSIS — K589 Irritable bowel syndrome without diarrhea: Secondary | ICD-10-CM | POA: Diagnosis not present

## 2018-01-26 DIAGNOSIS — Z853 Personal history of malignant neoplasm of breast: Secondary | ICD-10-CM | POA: Diagnosis not present

## 2018-01-26 DIAGNOSIS — I480 Paroxysmal atrial fibrillation: Secondary | ICD-10-CM | POA: Diagnosis not present

## 2018-01-26 DIAGNOSIS — N3281 Overactive bladder: Secondary | ICD-10-CM | POA: Diagnosis not present

## 2018-01-26 DIAGNOSIS — N183 Chronic kidney disease, stage 3 (moderate): Secondary | ICD-10-CM | POA: Diagnosis not present

## 2018-01-26 DIAGNOSIS — M545 Low back pain: Secondary | ICD-10-CM | POA: Diagnosis not present

## 2018-01-26 DIAGNOSIS — I6529 Occlusion and stenosis of unspecified carotid artery: Secondary | ICD-10-CM | POA: Diagnosis not present

## 2018-01-26 NOTE — Telephone Encounter (Signed)
[name] from [facility] wanted to call and verify with provider that it's okay for her to take Amlodipine.   Amlodipine is listed on pt's allergy list and noted to cause edema.   Amlodipine was added as intolerance on 11/30/2013 when she was taking higher doses (10mg  and 5 mg). A low dose Amlodipine (2.5mg ) was started in 2017 and tolerated well by patient, per office notes.  Just want OK from PCP to let [facility] nurse know ok for pt to continue Amlodipine, since it is an intolerance at higher level and not a true allergy.

## 2018-01-27 ENCOUNTER — Telehealth: Payer: Self-pay

## 2018-01-27 DIAGNOSIS — E785 Hyperlipidemia, unspecified: Secondary | ICD-10-CM | POA: Diagnosis not present

## 2018-01-27 DIAGNOSIS — I129 Hypertensive chronic kidney disease with stage 1 through stage 4 chronic kidney disease, or unspecified chronic kidney disease: Secondary | ICD-10-CM | POA: Diagnosis not present

## 2018-01-27 DIAGNOSIS — M329 Systemic lupus erythematosus, unspecified: Secondary | ICD-10-CM | POA: Diagnosis not present

## 2018-01-27 DIAGNOSIS — R7301 Impaired fasting glucose: Secondary | ICD-10-CM | POA: Diagnosis not present

## 2018-01-27 DIAGNOSIS — I6529 Occlusion and stenosis of unspecified carotid artery: Secondary | ICD-10-CM | POA: Diagnosis not present

## 2018-01-27 DIAGNOSIS — Z853 Personal history of malignant neoplasm of breast: Secondary | ICD-10-CM | POA: Diagnosis not present

## 2018-01-27 DIAGNOSIS — N183 Chronic kidney disease, stage 3 (moderate): Secondary | ICD-10-CM | POA: Diagnosis not present

## 2018-01-27 DIAGNOSIS — M545 Low back pain: Secondary | ICD-10-CM | POA: Diagnosis not present

## 2018-01-27 DIAGNOSIS — I480 Paroxysmal atrial fibrillation: Secondary | ICD-10-CM | POA: Diagnosis not present

## 2018-01-27 DIAGNOSIS — D509 Iron deficiency anemia, unspecified: Secondary | ICD-10-CM | POA: Diagnosis not present

## 2018-01-27 DIAGNOSIS — Z9181 History of falling: Secondary | ICD-10-CM | POA: Diagnosis not present

## 2018-01-27 DIAGNOSIS — I739 Peripheral vascular disease, unspecified: Secondary | ICD-10-CM | POA: Diagnosis not present

## 2018-01-27 DIAGNOSIS — N3281 Overactive bladder: Secondary | ICD-10-CM | POA: Diagnosis not present

## 2018-01-27 DIAGNOSIS — K589 Irritable bowel syndrome without diarrhea: Secondary | ICD-10-CM | POA: Diagnosis not present

## 2018-01-27 NOTE — Telephone Encounter (Signed)
Pt aware, will continue medication

## 2018-01-27 NOTE — Telephone Encounter (Signed)
Left detailed message with Home Health and provided a call back number.

## 2018-01-27 NOTE — Telephone Encounter (Signed)
[name] with [facility] called and left a message stating [name] is taking Amlodipine even though Amlodipine on allergy list with a side effect of swelling. She reports [name] does have swelling in both feet. She also states [name] is very hesitant about taking the medication. Please advise.

## 2018-01-27 NOTE — Telephone Encounter (Signed)
If she wants to hold the medication for 2 weeks and see if she notices a difference in her swelling then we can.  Swelling is more typical of the 5 in 10 mg strength not typically the 2.5.  But I am willing to hold it for 2 weeks just to see if it makes a difference.  If there is no change in her swelling then I would like her to restart it.  Because it is going to help lower her blood pressure.  But if it is helpful for her swelling then please let me know and we will have to find another medication to use in its place.

## 2018-01-27 NOTE — Telephone Encounter (Signed)
Yes, ok for the 2.5mg  amlodipine

## 2018-01-28 DIAGNOSIS — D509 Iron deficiency anemia, unspecified: Secondary | ICD-10-CM | POA: Diagnosis not present

## 2018-01-28 DIAGNOSIS — Z853 Personal history of malignant neoplasm of breast: Secondary | ICD-10-CM | POA: Diagnosis not present

## 2018-01-28 DIAGNOSIS — I6529 Occlusion and stenosis of unspecified carotid artery: Secondary | ICD-10-CM | POA: Diagnosis not present

## 2018-01-28 DIAGNOSIS — M545 Low back pain: Secondary | ICD-10-CM | POA: Diagnosis not present

## 2018-01-28 DIAGNOSIS — I480 Paroxysmal atrial fibrillation: Secondary | ICD-10-CM | POA: Diagnosis not present

## 2018-01-28 DIAGNOSIS — R7301 Impaired fasting glucose: Secondary | ICD-10-CM | POA: Diagnosis not present

## 2018-01-28 DIAGNOSIS — E785 Hyperlipidemia, unspecified: Secondary | ICD-10-CM | POA: Diagnosis not present

## 2018-01-28 DIAGNOSIS — N183 Chronic kidney disease, stage 3 (moderate): Secondary | ICD-10-CM | POA: Diagnosis not present

## 2018-01-28 DIAGNOSIS — K589 Irritable bowel syndrome without diarrhea: Secondary | ICD-10-CM | POA: Diagnosis not present

## 2018-01-28 DIAGNOSIS — Z9181 History of falling: Secondary | ICD-10-CM | POA: Diagnosis not present

## 2018-01-28 DIAGNOSIS — M329 Systemic lupus erythematosus, unspecified: Secondary | ICD-10-CM | POA: Diagnosis not present

## 2018-01-28 DIAGNOSIS — N3281 Overactive bladder: Secondary | ICD-10-CM | POA: Diagnosis not present

## 2018-01-28 DIAGNOSIS — I129 Hypertensive chronic kidney disease with stage 1 through stage 4 chronic kidney disease, or unspecified chronic kidney disease: Secondary | ICD-10-CM | POA: Diagnosis not present

## 2018-01-28 DIAGNOSIS — I739 Peripheral vascular disease, unspecified: Secondary | ICD-10-CM | POA: Diagnosis not present

## 2018-01-31 DIAGNOSIS — Z853 Personal history of malignant neoplasm of breast: Secondary | ICD-10-CM | POA: Diagnosis not present

## 2018-01-31 DIAGNOSIS — D509 Iron deficiency anemia, unspecified: Secondary | ICD-10-CM | POA: Diagnosis not present

## 2018-01-31 DIAGNOSIS — I480 Paroxysmal atrial fibrillation: Secondary | ICD-10-CM | POA: Diagnosis not present

## 2018-01-31 DIAGNOSIS — R7301 Impaired fasting glucose: Secondary | ICD-10-CM | POA: Diagnosis not present

## 2018-01-31 DIAGNOSIS — M329 Systemic lupus erythematosus, unspecified: Secondary | ICD-10-CM | POA: Diagnosis not present

## 2018-01-31 DIAGNOSIS — K589 Irritable bowel syndrome without diarrhea: Secondary | ICD-10-CM | POA: Diagnosis not present

## 2018-01-31 DIAGNOSIS — N3281 Overactive bladder: Secondary | ICD-10-CM | POA: Diagnosis not present

## 2018-01-31 DIAGNOSIS — N183 Chronic kidney disease, stage 3 (moderate): Secondary | ICD-10-CM | POA: Diagnosis not present

## 2018-01-31 DIAGNOSIS — Z9181 History of falling: Secondary | ICD-10-CM | POA: Diagnosis not present

## 2018-01-31 DIAGNOSIS — E785 Hyperlipidemia, unspecified: Secondary | ICD-10-CM | POA: Diagnosis not present

## 2018-01-31 DIAGNOSIS — I129 Hypertensive chronic kidney disease with stage 1 through stage 4 chronic kidney disease, or unspecified chronic kidney disease: Secondary | ICD-10-CM | POA: Diagnosis not present

## 2018-01-31 DIAGNOSIS — M545 Low back pain: Secondary | ICD-10-CM | POA: Diagnosis not present

## 2018-01-31 DIAGNOSIS — I739 Peripheral vascular disease, unspecified: Secondary | ICD-10-CM | POA: Diagnosis not present

## 2018-01-31 DIAGNOSIS — I6529 Occlusion and stenosis of unspecified carotid artery: Secondary | ICD-10-CM | POA: Diagnosis not present

## 2018-02-01 DIAGNOSIS — K589 Irritable bowel syndrome without diarrhea: Secondary | ICD-10-CM | POA: Diagnosis not present

## 2018-02-01 DIAGNOSIS — D509 Iron deficiency anemia, unspecified: Secondary | ICD-10-CM | POA: Diagnosis not present

## 2018-02-01 DIAGNOSIS — Z9181 History of falling: Secondary | ICD-10-CM | POA: Diagnosis not present

## 2018-02-01 DIAGNOSIS — Z853 Personal history of malignant neoplasm of breast: Secondary | ICD-10-CM | POA: Diagnosis not present

## 2018-02-01 DIAGNOSIS — I129 Hypertensive chronic kidney disease with stage 1 through stage 4 chronic kidney disease, or unspecified chronic kidney disease: Secondary | ICD-10-CM | POA: Diagnosis not present

## 2018-02-01 DIAGNOSIS — N3281 Overactive bladder: Secondary | ICD-10-CM | POA: Diagnosis not present

## 2018-02-01 DIAGNOSIS — E785 Hyperlipidemia, unspecified: Secondary | ICD-10-CM | POA: Diagnosis not present

## 2018-02-01 DIAGNOSIS — R7301 Impaired fasting glucose: Secondary | ICD-10-CM | POA: Diagnosis not present

## 2018-02-01 DIAGNOSIS — I6529 Occlusion and stenosis of unspecified carotid artery: Secondary | ICD-10-CM | POA: Diagnosis not present

## 2018-02-01 DIAGNOSIS — N183 Chronic kidney disease, stage 3 (moderate): Secondary | ICD-10-CM | POA: Diagnosis not present

## 2018-02-01 DIAGNOSIS — M545 Low back pain: Secondary | ICD-10-CM | POA: Diagnosis not present

## 2018-02-01 DIAGNOSIS — M329 Systemic lupus erythematosus, unspecified: Secondary | ICD-10-CM | POA: Diagnosis not present

## 2018-02-01 DIAGNOSIS — I739 Peripheral vascular disease, unspecified: Secondary | ICD-10-CM | POA: Diagnosis not present

## 2018-02-01 DIAGNOSIS — I480 Paroxysmal atrial fibrillation: Secondary | ICD-10-CM | POA: Diagnosis not present

## 2018-02-02 DIAGNOSIS — I129 Hypertensive chronic kidney disease with stage 1 through stage 4 chronic kidney disease, or unspecified chronic kidney disease: Secondary | ICD-10-CM | POA: Diagnosis not present

## 2018-02-02 DIAGNOSIS — N3281 Overactive bladder: Secondary | ICD-10-CM | POA: Diagnosis not present

## 2018-02-02 DIAGNOSIS — N183 Chronic kidney disease, stage 3 (moderate): Secondary | ICD-10-CM | POA: Diagnosis not present

## 2018-02-02 DIAGNOSIS — I6529 Occlusion and stenosis of unspecified carotid artery: Secondary | ICD-10-CM | POA: Diagnosis not present

## 2018-02-02 DIAGNOSIS — Z853 Personal history of malignant neoplasm of breast: Secondary | ICD-10-CM | POA: Diagnosis not present

## 2018-02-02 DIAGNOSIS — I480 Paroxysmal atrial fibrillation: Secondary | ICD-10-CM | POA: Diagnosis not present

## 2018-02-02 DIAGNOSIS — Z9181 History of falling: Secondary | ICD-10-CM | POA: Diagnosis not present

## 2018-02-02 DIAGNOSIS — M329 Systemic lupus erythematosus, unspecified: Secondary | ICD-10-CM | POA: Diagnosis not present

## 2018-02-02 DIAGNOSIS — I739 Peripheral vascular disease, unspecified: Secondary | ICD-10-CM | POA: Diagnosis not present

## 2018-02-02 DIAGNOSIS — K589 Irritable bowel syndrome without diarrhea: Secondary | ICD-10-CM | POA: Diagnosis not present

## 2018-02-02 DIAGNOSIS — R7301 Impaired fasting glucose: Secondary | ICD-10-CM | POA: Diagnosis not present

## 2018-02-02 DIAGNOSIS — D509 Iron deficiency anemia, unspecified: Secondary | ICD-10-CM | POA: Diagnosis not present

## 2018-02-02 DIAGNOSIS — E785 Hyperlipidemia, unspecified: Secondary | ICD-10-CM | POA: Diagnosis not present

## 2018-02-02 DIAGNOSIS — M545 Low back pain: Secondary | ICD-10-CM | POA: Diagnosis not present

## 2018-02-03 DIAGNOSIS — R7301 Impaired fasting glucose: Secondary | ICD-10-CM | POA: Diagnosis not present

## 2018-02-03 DIAGNOSIS — I480 Paroxysmal atrial fibrillation: Secondary | ICD-10-CM | POA: Diagnosis not present

## 2018-02-03 DIAGNOSIS — D509 Iron deficiency anemia, unspecified: Secondary | ICD-10-CM | POA: Diagnosis not present

## 2018-02-03 DIAGNOSIS — M329 Systemic lupus erythematosus, unspecified: Secondary | ICD-10-CM | POA: Diagnosis not present

## 2018-02-03 DIAGNOSIS — M545 Low back pain: Secondary | ICD-10-CM | POA: Diagnosis not present

## 2018-02-03 DIAGNOSIS — Z9181 History of falling: Secondary | ICD-10-CM | POA: Diagnosis not present

## 2018-02-03 DIAGNOSIS — I6529 Occlusion and stenosis of unspecified carotid artery: Secondary | ICD-10-CM | POA: Diagnosis not present

## 2018-02-03 DIAGNOSIS — N3281 Overactive bladder: Secondary | ICD-10-CM | POA: Diagnosis not present

## 2018-02-03 DIAGNOSIS — K589 Irritable bowel syndrome without diarrhea: Secondary | ICD-10-CM | POA: Diagnosis not present

## 2018-02-03 DIAGNOSIS — I129 Hypertensive chronic kidney disease with stage 1 through stage 4 chronic kidney disease, or unspecified chronic kidney disease: Secondary | ICD-10-CM | POA: Diagnosis not present

## 2018-02-03 DIAGNOSIS — N183 Chronic kidney disease, stage 3 (moderate): Secondary | ICD-10-CM | POA: Diagnosis not present

## 2018-02-03 DIAGNOSIS — I739 Peripheral vascular disease, unspecified: Secondary | ICD-10-CM | POA: Diagnosis not present

## 2018-02-03 DIAGNOSIS — E785 Hyperlipidemia, unspecified: Secondary | ICD-10-CM | POA: Diagnosis not present

## 2018-02-03 DIAGNOSIS — Z853 Personal history of malignant neoplasm of breast: Secondary | ICD-10-CM | POA: Diagnosis not present

## 2018-02-04 DIAGNOSIS — N183 Chronic kidney disease, stage 3 (moderate): Secondary | ICD-10-CM | POA: Diagnosis not present

## 2018-02-04 DIAGNOSIS — D509 Iron deficiency anemia, unspecified: Secondary | ICD-10-CM | POA: Diagnosis not present

## 2018-02-04 DIAGNOSIS — I129 Hypertensive chronic kidney disease with stage 1 through stage 4 chronic kidney disease, or unspecified chronic kidney disease: Secondary | ICD-10-CM | POA: Diagnosis not present

## 2018-02-04 DIAGNOSIS — E785 Hyperlipidemia, unspecified: Secondary | ICD-10-CM | POA: Diagnosis not present

## 2018-02-04 DIAGNOSIS — R7301 Impaired fasting glucose: Secondary | ICD-10-CM | POA: Diagnosis not present

## 2018-02-04 DIAGNOSIS — I739 Peripheral vascular disease, unspecified: Secondary | ICD-10-CM | POA: Diagnosis not present

## 2018-02-04 DIAGNOSIS — I480 Paroxysmal atrial fibrillation: Secondary | ICD-10-CM | POA: Diagnosis not present

## 2018-02-04 DIAGNOSIS — K589 Irritable bowel syndrome without diarrhea: Secondary | ICD-10-CM | POA: Diagnosis not present

## 2018-02-04 DIAGNOSIS — M545 Low back pain: Secondary | ICD-10-CM | POA: Diagnosis not present

## 2018-02-04 DIAGNOSIS — Z853 Personal history of malignant neoplasm of breast: Secondary | ICD-10-CM | POA: Diagnosis not present

## 2018-02-04 DIAGNOSIS — Z9181 History of falling: Secondary | ICD-10-CM | POA: Diagnosis not present

## 2018-02-04 DIAGNOSIS — M329 Systemic lupus erythematosus, unspecified: Secondary | ICD-10-CM | POA: Diagnosis not present

## 2018-02-04 DIAGNOSIS — N3281 Overactive bladder: Secondary | ICD-10-CM | POA: Diagnosis not present

## 2018-02-04 DIAGNOSIS — I6529 Occlusion and stenosis of unspecified carotid artery: Secondary | ICD-10-CM | POA: Diagnosis not present

## 2018-02-07 DIAGNOSIS — M545 Low back pain: Secondary | ICD-10-CM | POA: Diagnosis not present

## 2018-02-07 DIAGNOSIS — I739 Peripheral vascular disease, unspecified: Secondary | ICD-10-CM | POA: Diagnosis not present

## 2018-02-07 DIAGNOSIS — I6529 Occlusion and stenosis of unspecified carotid artery: Secondary | ICD-10-CM | POA: Diagnosis not present

## 2018-02-07 DIAGNOSIS — Z9181 History of falling: Secondary | ICD-10-CM | POA: Diagnosis not present

## 2018-02-07 DIAGNOSIS — R7301 Impaired fasting glucose: Secondary | ICD-10-CM | POA: Diagnosis not present

## 2018-02-07 DIAGNOSIS — K589 Irritable bowel syndrome without diarrhea: Secondary | ICD-10-CM | POA: Diagnosis not present

## 2018-02-07 DIAGNOSIS — N3281 Overactive bladder: Secondary | ICD-10-CM | POA: Diagnosis not present

## 2018-02-07 DIAGNOSIS — N183 Chronic kidney disease, stage 3 (moderate): Secondary | ICD-10-CM | POA: Diagnosis not present

## 2018-02-07 DIAGNOSIS — M329 Systemic lupus erythematosus, unspecified: Secondary | ICD-10-CM | POA: Diagnosis not present

## 2018-02-07 DIAGNOSIS — I129 Hypertensive chronic kidney disease with stage 1 through stage 4 chronic kidney disease, or unspecified chronic kidney disease: Secondary | ICD-10-CM | POA: Diagnosis not present

## 2018-02-07 DIAGNOSIS — I480 Paroxysmal atrial fibrillation: Secondary | ICD-10-CM | POA: Diagnosis not present

## 2018-02-07 DIAGNOSIS — D509 Iron deficiency anemia, unspecified: Secondary | ICD-10-CM | POA: Diagnosis not present

## 2018-02-07 DIAGNOSIS — Z853 Personal history of malignant neoplasm of breast: Secondary | ICD-10-CM | POA: Diagnosis not present

## 2018-02-07 DIAGNOSIS — E785 Hyperlipidemia, unspecified: Secondary | ICD-10-CM | POA: Diagnosis not present

## 2018-02-09 DIAGNOSIS — I6529 Occlusion and stenosis of unspecified carotid artery: Secondary | ICD-10-CM | POA: Diagnosis not present

## 2018-02-09 DIAGNOSIS — N3281 Overactive bladder: Secondary | ICD-10-CM | POA: Diagnosis not present

## 2018-02-09 DIAGNOSIS — I480 Paroxysmal atrial fibrillation: Secondary | ICD-10-CM | POA: Diagnosis not present

## 2018-02-09 DIAGNOSIS — N183 Chronic kidney disease, stage 3 (moderate): Secondary | ICD-10-CM | POA: Diagnosis not present

## 2018-02-09 DIAGNOSIS — Z853 Personal history of malignant neoplasm of breast: Secondary | ICD-10-CM | POA: Diagnosis not present

## 2018-02-09 DIAGNOSIS — Z9181 History of falling: Secondary | ICD-10-CM | POA: Diagnosis not present

## 2018-02-09 DIAGNOSIS — M329 Systemic lupus erythematosus, unspecified: Secondary | ICD-10-CM | POA: Diagnosis not present

## 2018-02-09 DIAGNOSIS — R7301 Impaired fasting glucose: Secondary | ICD-10-CM | POA: Diagnosis not present

## 2018-02-09 DIAGNOSIS — I739 Peripheral vascular disease, unspecified: Secondary | ICD-10-CM | POA: Diagnosis not present

## 2018-02-09 DIAGNOSIS — K589 Irritable bowel syndrome without diarrhea: Secondary | ICD-10-CM | POA: Diagnosis not present

## 2018-02-09 DIAGNOSIS — D509 Iron deficiency anemia, unspecified: Secondary | ICD-10-CM | POA: Diagnosis not present

## 2018-02-09 DIAGNOSIS — I129 Hypertensive chronic kidney disease with stage 1 through stage 4 chronic kidney disease, or unspecified chronic kidney disease: Secondary | ICD-10-CM | POA: Diagnosis not present

## 2018-02-09 DIAGNOSIS — M545 Low back pain: Secondary | ICD-10-CM | POA: Diagnosis not present

## 2018-02-09 DIAGNOSIS — E785 Hyperlipidemia, unspecified: Secondary | ICD-10-CM | POA: Diagnosis not present

## 2018-02-10 DIAGNOSIS — I6529 Occlusion and stenosis of unspecified carotid artery: Secondary | ICD-10-CM | POA: Diagnosis not present

## 2018-02-10 DIAGNOSIS — K589 Irritable bowel syndrome without diarrhea: Secondary | ICD-10-CM | POA: Diagnosis not present

## 2018-02-10 DIAGNOSIS — R7301 Impaired fasting glucose: Secondary | ICD-10-CM | POA: Diagnosis not present

## 2018-02-10 DIAGNOSIS — M329 Systemic lupus erythematosus, unspecified: Secondary | ICD-10-CM | POA: Diagnosis not present

## 2018-02-10 DIAGNOSIS — N3281 Overactive bladder: Secondary | ICD-10-CM | POA: Diagnosis not present

## 2018-02-10 DIAGNOSIS — D509 Iron deficiency anemia, unspecified: Secondary | ICD-10-CM | POA: Diagnosis not present

## 2018-02-10 DIAGNOSIS — N183 Chronic kidney disease, stage 3 (moderate): Secondary | ICD-10-CM | POA: Diagnosis not present

## 2018-02-10 DIAGNOSIS — E785 Hyperlipidemia, unspecified: Secondary | ICD-10-CM | POA: Diagnosis not present

## 2018-02-10 DIAGNOSIS — I480 Paroxysmal atrial fibrillation: Secondary | ICD-10-CM | POA: Diagnosis not present

## 2018-02-10 DIAGNOSIS — I129 Hypertensive chronic kidney disease with stage 1 through stage 4 chronic kidney disease, or unspecified chronic kidney disease: Secondary | ICD-10-CM | POA: Diagnosis not present

## 2018-02-10 DIAGNOSIS — Z853 Personal history of malignant neoplasm of breast: Secondary | ICD-10-CM | POA: Diagnosis not present

## 2018-02-10 DIAGNOSIS — I739 Peripheral vascular disease, unspecified: Secondary | ICD-10-CM | POA: Diagnosis not present

## 2018-02-10 DIAGNOSIS — Z9181 History of falling: Secondary | ICD-10-CM | POA: Diagnosis not present

## 2018-02-10 DIAGNOSIS — M545 Low back pain: Secondary | ICD-10-CM | POA: Diagnosis not present

## 2018-02-14 DIAGNOSIS — M329 Systemic lupus erythematosus, unspecified: Secondary | ICD-10-CM | POA: Diagnosis not present

## 2018-02-14 DIAGNOSIS — K589 Irritable bowel syndrome without diarrhea: Secondary | ICD-10-CM | POA: Diagnosis not present

## 2018-02-14 DIAGNOSIS — D509 Iron deficiency anemia, unspecified: Secondary | ICD-10-CM | POA: Diagnosis not present

## 2018-02-14 DIAGNOSIS — E785 Hyperlipidemia, unspecified: Secondary | ICD-10-CM | POA: Diagnosis not present

## 2018-02-14 DIAGNOSIS — N183 Chronic kidney disease, stage 3 (moderate): Secondary | ICD-10-CM | POA: Diagnosis not present

## 2018-02-14 DIAGNOSIS — N3281 Overactive bladder: Secondary | ICD-10-CM | POA: Diagnosis not present

## 2018-02-14 DIAGNOSIS — M545 Low back pain: Secondary | ICD-10-CM | POA: Diagnosis not present

## 2018-02-14 DIAGNOSIS — I480 Paroxysmal atrial fibrillation: Secondary | ICD-10-CM | POA: Diagnosis not present

## 2018-02-14 DIAGNOSIS — Z9181 History of falling: Secondary | ICD-10-CM | POA: Diagnosis not present

## 2018-02-14 DIAGNOSIS — I6529 Occlusion and stenosis of unspecified carotid artery: Secondary | ICD-10-CM | POA: Diagnosis not present

## 2018-02-14 DIAGNOSIS — R7301 Impaired fasting glucose: Secondary | ICD-10-CM | POA: Diagnosis not present

## 2018-02-14 DIAGNOSIS — Z853 Personal history of malignant neoplasm of breast: Secondary | ICD-10-CM | POA: Diagnosis not present

## 2018-02-14 DIAGNOSIS — I129 Hypertensive chronic kidney disease with stage 1 through stage 4 chronic kidney disease, or unspecified chronic kidney disease: Secondary | ICD-10-CM | POA: Diagnosis not present

## 2018-02-14 DIAGNOSIS — I739 Peripheral vascular disease, unspecified: Secondary | ICD-10-CM | POA: Diagnosis not present

## 2018-02-15 DIAGNOSIS — I739 Peripheral vascular disease, unspecified: Secondary | ICD-10-CM | POA: Diagnosis not present

## 2018-02-15 DIAGNOSIS — R7301 Impaired fasting glucose: Secondary | ICD-10-CM | POA: Diagnosis not present

## 2018-02-15 DIAGNOSIS — K589 Irritable bowel syndrome without diarrhea: Secondary | ICD-10-CM | POA: Diagnosis not present

## 2018-02-15 DIAGNOSIS — N3281 Overactive bladder: Secondary | ICD-10-CM | POA: Diagnosis not present

## 2018-02-15 DIAGNOSIS — M329 Systemic lupus erythematosus, unspecified: Secondary | ICD-10-CM | POA: Diagnosis not present

## 2018-02-15 DIAGNOSIS — I480 Paroxysmal atrial fibrillation: Secondary | ICD-10-CM | POA: Diagnosis not present

## 2018-02-15 DIAGNOSIS — N183 Chronic kidney disease, stage 3 (moderate): Secondary | ICD-10-CM | POA: Diagnosis not present

## 2018-02-15 DIAGNOSIS — D509 Iron deficiency anemia, unspecified: Secondary | ICD-10-CM | POA: Diagnosis not present

## 2018-02-15 DIAGNOSIS — M545 Low back pain: Secondary | ICD-10-CM | POA: Diagnosis not present

## 2018-02-15 DIAGNOSIS — Z853 Personal history of malignant neoplasm of breast: Secondary | ICD-10-CM | POA: Diagnosis not present

## 2018-02-15 DIAGNOSIS — Z9181 History of falling: Secondary | ICD-10-CM | POA: Diagnosis not present

## 2018-02-15 DIAGNOSIS — I6529 Occlusion and stenosis of unspecified carotid artery: Secondary | ICD-10-CM | POA: Diagnosis not present

## 2018-02-15 DIAGNOSIS — I129 Hypertensive chronic kidney disease with stage 1 through stage 4 chronic kidney disease, or unspecified chronic kidney disease: Secondary | ICD-10-CM | POA: Diagnosis not present

## 2018-02-15 DIAGNOSIS — E785 Hyperlipidemia, unspecified: Secondary | ICD-10-CM | POA: Diagnosis not present

## 2018-02-16 DIAGNOSIS — R7301 Impaired fasting glucose: Secondary | ICD-10-CM | POA: Diagnosis not present

## 2018-02-16 DIAGNOSIS — N3281 Overactive bladder: Secondary | ICD-10-CM | POA: Diagnosis not present

## 2018-02-16 DIAGNOSIS — M545 Low back pain: Secondary | ICD-10-CM | POA: Diagnosis not present

## 2018-02-16 DIAGNOSIS — K589 Irritable bowel syndrome without diarrhea: Secondary | ICD-10-CM | POA: Diagnosis not present

## 2018-02-16 DIAGNOSIS — M329 Systemic lupus erythematosus, unspecified: Secondary | ICD-10-CM | POA: Diagnosis not present

## 2018-02-16 DIAGNOSIS — I739 Peripheral vascular disease, unspecified: Secondary | ICD-10-CM | POA: Diagnosis not present

## 2018-02-16 DIAGNOSIS — Z853 Personal history of malignant neoplasm of breast: Secondary | ICD-10-CM | POA: Diagnosis not present

## 2018-02-16 DIAGNOSIS — I6529 Occlusion and stenosis of unspecified carotid artery: Secondary | ICD-10-CM | POA: Diagnosis not present

## 2018-02-16 DIAGNOSIS — D509 Iron deficiency anemia, unspecified: Secondary | ICD-10-CM | POA: Diagnosis not present

## 2018-02-16 DIAGNOSIS — Z9181 History of falling: Secondary | ICD-10-CM | POA: Diagnosis not present

## 2018-02-16 DIAGNOSIS — I480 Paroxysmal atrial fibrillation: Secondary | ICD-10-CM | POA: Diagnosis not present

## 2018-02-16 DIAGNOSIS — N183 Chronic kidney disease, stage 3 (moderate): Secondary | ICD-10-CM | POA: Diagnosis not present

## 2018-02-16 DIAGNOSIS — I129 Hypertensive chronic kidney disease with stage 1 through stage 4 chronic kidney disease, or unspecified chronic kidney disease: Secondary | ICD-10-CM | POA: Diagnosis not present

## 2018-02-16 DIAGNOSIS — E785 Hyperlipidemia, unspecified: Secondary | ICD-10-CM | POA: Diagnosis not present

## 2018-02-17 DIAGNOSIS — I480 Paroxysmal atrial fibrillation: Secondary | ICD-10-CM | POA: Diagnosis not present

## 2018-02-17 DIAGNOSIS — I129 Hypertensive chronic kidney disease with stage 1 through stage 4 chronic kidney disease, or unspecified chronic kidney disease: Secondary | ICD-10-CM | POA: Diagnosis not present

## 2018-02-17 DIAGNOSIS — N3281 Overactive bladder: Secondary | ICD-10-CM | POA: Diagnosis not present

## 2018-02-17 DIAGNOSIS — E785 Hyperlipidemia, unspecified: Secondary | ICD-10-CM | POA: Diagnosis not present

## 2018-02-17 DIAGNOSIS — M545 Low back pain: Secondary | ICD-10-CM | POA: Diagnosis not present

## 2018-02-17 DIAGNOSIS — D509 Iron deficiency anemia, unspecified: Secondary | ICD-10-CM | POA: Diagnosis not present

## 2018-02-17 DIAGNOSIS — I6529 Occlusion and stenosis of unspecified carotid artery: Secondary | ICD-10-CM | POA: Diagnosis not present

## 2018-02-17 DIAGNOSIS — Z9181 History of falling: Secondary | ICD-10-CM | POA: Diagnosis not present

## 2018-02-17 DIAGNOSIS — I739 Peripheral vascular disease, unspecified: Secondary | ICD-10-CM | POA: Diagnosis not present

## 2018-02-17 DIAGNOSIS — R7301 Impaired fasting glucose: Secondary | ICD-10-CM | POA: Diagnosis not present

## 2018-02-17 DIAGNOSIS — Z853 Personal history of malignant neoplasm of breast: Secondary | ICD-10-CM | POA: Diagnosis not present

## 2018-02-17 DIAGNOSIS — M329 Systemic lupus erythematosus, unspecified: Secondary | ICD-10-CM | POA: Diagnosis not present

## 2018-02-17 DIAGNOSIS — N183 Chronic kidney disease, stage 3 (moderate): Secondary | ICD-10-CM | POA: Diagnosis not present

## 2018-02-17 DIAGNOSIS — K589 Irritable bowel syndrome without diarrhea: Secondary | ICD-10-CM | POA: Diagnosis not present

## 2018-02-21 DIAGNOSIS — M329 Systemic lupus erythematosus, unspecified: Secondary | ICD-10-CM | POA: Diagnosis not present

## 2018-02-21 DIAGNOSIS — D509 Iron deficiency anemia, unspecified: Secondary | ICD-10-CM | POA: Diagnosis not present

## 2018-02-21 DIAGNOSIS — I6529 Occlusion and stenosis of unspecified carotid artery: Secondary | ICD-10-CM | POA: Diagnosis not present

## 2018-02-21 DIAGNOSIS — N183 Chronic kidney disease, stage 3 (moderate): Secondary | ICD-10-CM | POA: Diagnosis not present

## 2018-02-21 DIAGNOSIS — Z853 Personal history of malignant neoplasm of breast: Secondary | ICD-10-CM | POA: Diagnosis not present

## 2018-02-21 DIAGNOSIS — I129 Hypertensive chronic kidney disease with stage 1 through stage 4 chronic kidney disease, or unspecified chronic kidney disease: Secondary | ICD-10-CM | POA: Diagnosis not present

## 2018-02-21 DIAGNOSIS — I739 Peripheral vascular disease, unspecified: Secondary | ICD-10-CM | POA: Diagnosis not present

## 2018-02-21 DIAGNOSIS — N3281 Overactive bladder: Secondary | ICD-10-CM | POA: Diagnosis not present

## 2018-02-21 DIAGNOSIS — K589 Irritable bowel syndrome without diarrhea: Secondary | ICD-10-CM | POA: Diagnosis not present

## 2018-02-21 DIAGNOSIS — E785 Hyperlipidemia, unspecified: Secondary | ICD-10-CM | POA: Diagnosis not present

## 2018-02-21 DIAGNOSIS — M545 Low back pain: Secondary | ICD-10-CM | POA: Diagnosis not present

## 2018-02-21 DIAGNOSIS — I480 Paroxysmal atrial fibrillation: Secondary | ICD-10-CM | POA: Diagnosis not present

## 2018-02-21 DIAGNOSIS — R7301 Impaired fasting glucose: Secondary | ICD-10-CM | POA: Diagnosis not present

## 2018-02-21 DIAGNOSIS — Z9181 History of falling: Secondary | ICD-10-CM | POA: Diagnosis not present

## 2018-02-22 DIAGNOSIS — I480 Paroxysmal atrial fibrillation: Secondary | ICD-10-CM | POA: Diagnosis not present

## 2018-02-22 DIAGNOSIS — I739 Peripheral vascular disease, unspecified: Secondary | ICD-10-CM | POA: Diagnosis not present

## 2018-02-22 DIAGNOSIS — R7301 Impaired fasting glucose: Secondary | ICD-10-CM | POA: Diagnosis not present

## 2018-02-22 DIAGNOSIS — M329 Systemic lupus erythematosus, unspecified: Secondary | ICD-10-CM | POA: Diagnosis not present

## 2018-02-22 DIAGNOSIS — I6529 Occlusion and stenosis of unspecified carotid artery: Secondary | ICD-10-CM | POA: Diagnosis not present

## 2018-02-22 DIAGNOSIS — I129 Hypertensive chronic kidney disease with stage 1 through stage 4 chronic kidney disease, or unspecified chronic kidney disease: Secondary | ICD-10-CM | POA: Diagnosis not present

## 2018-02-22 DIAGNOSIS — Z9181 History of falling: Secondary | ICD-10-CM | POA: Diagnosis not present

## 2018-02-22 DIAGNOSIS — K589 Irritable bowel syndrome without diarrhea: Secondary | ICD-10-CM | POA: Diagnosis not present

## 2018-02-22 DIAGNOSIS — M545 Low back pain: Secondary | ICD-10-CM | POA: Diagnosis not present

## 2018-02-22 DIAGNOSIS — Z853 Personal history of malignant neoplasm of breast: Secondary | ICD-10-CM | POA: Diagnosis not present

## 2018-02-22 DIAGNOSIS — N183 Chronic kidney disease, stage 3 (moderate): Secondary | ICD-10-CM | POA: Diagnosis not present

## 2018-02-22 DIAGNOSIS — D509 Iron deficiency anemia, unspecified: Secondary | ICD-10-CM | POA: Diagnosis not present

## 2018-02-22 DIAGNOSIS — E785 Hyperlipidemia, unspecified: Secondary | ICD-10-CM | POA: Diagnosis not present

## 2018-02-22 DIAGNOSIS — N3281 Overactive bladder: Secondary | ICD-10-CM | POA: Diagnosis not present

## 2018-02-23 DIAGNOSIS — M329 Systemic lupus erythematosus, unspecified: Secondary | ICD-10-CM | POA: Diagnosis not present

## 2018-02-23 DIAGNOSIS — I480 Paroxysmal atrial fibrillation: Secondary | ICD-10-CM | POA: Diagnosis not present

## 2018-02-23 DIAGNOSIS — M545 Low back pain: Secondary | ICD-10-CM | POA: Diagnosis not present

## 2018-02-23 DIAGNOSIS — E785 Hyperlipidemia, unspecified: Secondary | ICD-10-CM | POA: Diagnosis not present

## 2018-02-23 DIAGNOSIS — K589 Irritable bowel syndrome without diarrhea: Secondary | ICD-10-CM | POA: Diagnosis not present

## 2018-02-23 DIAGNOSIS — N3281 Overactive bladder: Secondary | ICD-10-CM | POA: Diagnosis not present

## 2018-02-23 DIAGNOSIS — N183 Chronic kidney disease, stage 3 (moderate): Secondary | ICD-10-CM | POA: Diagnosis not present

## 2018-02-23 DIAGNOSIS — D509 Iron deficiency anemia, unspecified: Secondary | ICD-10-CM | POA: Diagnosis not present

## 2018-02-23 DIAGNOSIS — I129 Hypertensive chronic kidney disease with stage 1 through stage 4 chronic kidney disease, or unspecified chronic kidney disease: Secondary | ICD-10-CM | POA: Diagnosis not present

## 2018-02-23 DIAGNOSIS — R7301 Impaired fasting glucose: Secondary | ICD-10-CM | POA: Diagnosis not present

## 2018-02-23 DIAGNOSIS — Z9181 History of falling: Secondary | ICD-10-CM | POA: Diagnosis not present

## 2018-02-23 DIAGNOSIS — Z853 Personal history of malignant neoplasm of breast: Secondary | ICD-10-CM | POA: Diagnosis not present

## 2018-02-23 DIAGNOSIS — I6529 Occlusion and stenosis of unspecified carotid artery: Secondary | ICD-10-CM | POA: Diagnosis not present

## 2018-02-23 DIAGNOSIS — I739 Peripheral vascular disease, unspecified: Secondary | ICD-10-CM | POA: Diagnosis not present

## 2018-02-24 DIAGNOSIS — D509 Iron deficiency anemia, unspecified: Secondary | ICD-10-CM | POA: Diagnosis not present

## 2018-02-24 DIAGNOSIS — K589 Irritable bowel syndrome without diarrhea: Secondary | ICD-10-CM | POA: Diagnosis not present

## 2018-02-24 DIAGNOSIS — N183 Chronic kidney disease, stage 3 (moderate): Secondary | ICD-10-CM | POA: Diagnosis not present

## 2018-02-24 DIAGNOSIS — Z9181 History of falling: Secondary | ICD-10-CM | POA: Diagnosis not present

## 2018-02-24 DIAGNOSIS — Z853 Personal history of malignant neoplasm of breast: Secondary | ICD-10-CM | POA: Diagnosis not present

## 2018-02-24 DIAGNOSIS — I480 Paroxysmal atrial fibrillation: Secondary | ICD-10-CM | POA: Diagnosis not present

## 2018-02-24 DIAGNOSIS — I129 Hypertensive chronic kidney disease with stage 1 through stage 4 chronic kidney disease, or unspecified chronic kidney disease: Secondary | ICD-10-CM | POA: Diagnosis not present

## 2018-02-24 DIAGNOSIS — I6529 Occlusion and stenosis of unspecified carotid artery: Secondary | ICD-10-CM | POA: Diagnosis not present

## 2018-02-24 DIAGNOSIS — M545 Low back pain: Secondary | ICD-10-CM | POA: Diagnosis not present

## 2018-02-24 DIAGNOSIS — I739 Peripheral vascular disease, unspecified: Secondary | ICD-10-CM | POA: Diagnosis not present

## 2018-02-24 DIAGNOSIS — E785 Hyperlipidemia, unspecified: Secondary | ICD-10-CM | POA: Diagnosis not present

## 2018-02-24 DIAGNOSIS — N3281 Overactive bladder: Secondary | ICD-10-CM | POA: Diagnosis not present

## 2018-02-24 DIAGNOSIS — R7301 Impaired fasting glucose: Secondary | ICD-10-CM | POA: Diagnosis not present

## 2018-02-24 DIAGNOSIS — M329 Systemic lupus erythematosus, unspecified: Secondary | ICD-10-CM | POA: Diagnosis not present

## 2018-03-08 NOTE — Telephone Encounter (Signed)
See other telephone note.  

## 2018-03-10 DIAGNOSIS — I129 Hypertensive chronic kidney disease with stage 1 through stage 4 chronic kidney disease, or unspecified chronic kidney disease: Secondary | ICD-10-CM | POA: Diagnosis not present

## 2018-03-10 DIAGNOSIS — E785 Hyperlipidemia, unspecified: Secondary | ICD-10-CM | POA: Diagnosis not present

## 2018-03-10 DIAGNOSIS — M545 Low back pain: Secondary | ICD-10-CM | POA: Diagnosis not present

## 2018-03-10 DIAGNOSIS — M329 Systemic lupus erythematosus, unspecified: Secondary | ICD-10-CM | POA: Diagnosis not present

## 2018-03-10 DIAGNOSIS — R7301 Impaired fasting glucose: Secondary | ICD-10-CM | POA: Diagnosis not present

## 2018-03-10 DIAGNOSIS — K589 Irritable bowel syndrome without diarrhea: Secondary | ICD-10-CM | POA: Diagnosis not present

## 2018-03-10 DIAGNOSIS — I480 Paroxysmal atrial fibrillation: Secondary | ICD-10-CM | POA: Diagnosis not present

## 2018-03-10 DIAGNOSIS — N3281 Overactive bladder: Secondary | ICD-10-CM | POA: Diagnosis not present

## 2018-03-10 DIAGNOSIS — Z9181 History of falling: Secondary | ICD-10-CM | POA: Diagnosis not present

## 2018-03-10 DIAGNOSIS — D509 Iron deficiency anemia, unspecified: Secondary | ICD-10-CM | POA: Diagnosis not present

## 2018-03-10 DIAGNOSIS — I739 Peripheral vascular disease, unspecified: Secondary | ICD-10-CM | POA: Diagnosis not present

## 2018-03-10 DIAGNOSIS — Z853 Personal history of malignant neoplasm of breast: Secondary | ICD-10-CM | POA: Diagnosis not present

## 2018-03-10 DIAGNOSIS — N183 Chronic kidney disease, stage 3 (moderate): Secondary | ICD-10-CM | POA: Diagnosis not present

## 2018-03-10 DIAGNOSIS — I6529 Occlusion and stenosis of unspecified carotid artery: Secondary | ICD-10-CM | POA: Diagnosis not present

## 2018-03-11 ENCOUNTER — Other Ambulatory Visit: Payer: Self-pay

## 2018-03-11 MED ORDER — METOPROLOL TARTRATE 50 MG PO TABS: 50.0000 mg | ORAL_TABLET | Freq: Two times a day (BID) | ORAL | 1 refills | Status: DC

## 2018-03-15 DIAGNOSIS — D509 Iron deficiency anemia, unspecified: Secondary | ICD-10-CM | POA: Diagnosis not present

## 2018-03-15 DIAGNOSIS — I6529 Occlusion and stenosis of unspecified carotid artery: Secondary | ICD-10-CM | POA: Diagnosis not present

## 2018-03-15 DIAGNOSIS — Z9181 History of falling: Secondary | ICD-10-CM | POA: Diagnosis not present

## 2018-03-15 DIAGNOSIS — I739 Peripheral vascular disease, unspecified: Secondary | ICD-10-CM | POA: Diagnosis not present

## 2018-03-15 DIAGNOSIS — I480 Paroxysmal atrial fibrillation: Secondary | ICD-10-CM | POA: Diagnosis not present

## 2018-03-15 DIAGNOSIS — M545 Low back pain: Secondary | ICD-10-CM | POA: Diagnosis not present

## 2018-03-15 DIAGNOSIS — Z853 Personal history of malignant neoplasm of breast: Secondary | ICD-10-CM | POA: Diagnosis not present

## 2018-03-15 DIAGNOSIS — I129 Hypertensive chronic kidney disease with stage 1 through stage 4 chronic kidney disease, or unspecified chronic kidney disease: Secondary | ICD-10-CM | POA: Diagnosis not present

## 2018-03-15 DIAGNOSIS — E785 Hyperlipidemia, unspecified: Secondary | ICD-10-CM | POA: Diagnosis not present

## 2018-03-15 DIAGNOSIS — R7301 Impaired fasting glucose: Secondary | ICD-10-CM | POA: Diagnosis not present

## 2018-03-15 DIAGNOSIS — K589 Irritable bowel syndrome without diarrhea: Secondary | ICD-10-CM | POA: Diagnosis not present

## 2018-03-15 DIAGNOSIS — N183 Chronic kidney disease, stage 3 (moderate): Secondary | ICD-10-CM | POA: Diagnosis not present

## 2018-03-15 DIAGNOSIS — M329 Systemic lupus erythematosus, unspecified: Secondary | ICD-10-CM | POA: Diagnosis not present

## 2018-03-15 DIAGNOSIS — N3281 Overactive bladder: Secondary | ICD-10-CM | POA: Diagnosis not present

## 2018-03-22 DIAGNOSIS — E785 Hyperlipidemia, unspecified: Secondary | ICD-10-CM | POA: Diagnosis not present

## 2018-03-22 DIAGNOSIS — Z853 Personal history of malignant neoplasm of breast: Secondary | ICD-10-CM | POA: Diagnosis not present

## 2018-03-22 DIAGNOSIS — I480 Paroxysmal atrial fibrillation: Secondary | ICD-10-CM | POA: Diagnosis not present

## 2018-03-22 DIAGNOSIS — I6529 Occlusion and stenosis of unspecified carotid artery: Secondary | ICD-10-CM | POA: Diagnosis not present

## 2018-03-22 DIAGNOSIS — D509 Iron deficiency anemia, unspecified: Secondary | ICD-10-CM | POA: Diagnosis not present

## 2018-03-22 DIAGNOSIS — M329 Systemic lupus erythematosus, unspecified: Secondary | ICD-10-CM | POA: Diagnosis not present

## 2018-03-22 DIAGNOSIS — I129 Hypertensive chronic kidney disease with stage 1 through stage 4 chronic kidney disease, or unspecified chronic kidney disease: Secondary | ICD-10-CM | POA: Diagnosis not present

## 2018-03-22 DIAGNOSIS — M545 Low back pain: Secondary | ICD-10-CM | POA: Diagnosis not present

## 2018-03-22 DIAGNOSIS — N3281 Overactive bladder: Secondary | ICD-10-CM | POA: Diagnosis not present

## 2018-03-22 DIAGNOSIS — K589 Irritable bowel syndrome without diarrhea: Secondary | ICD-10-CM | POA: Diagnosis not present

## 2018-03-22 DIAGNOSIS — R7301 Impaired fasting glucose: Secondary | ICD-10-CM | POA: Diagnosis not present

## 2018-03-22 DIAGNOSIS — I739 Peripheral vascular disease, unspecified: Secondary | ICD-10-CM | POA: Diagnosis not present

## 2018-03-22 DIAGNOSIS — Z9181 History of falling: Secondary | ICD-10-CM | POA: Diagnosis not present

## 2018-03-22 DIAGNOSIS — N183 Chronic kidney disease, stage 3 (moderate): Secondary | ICD-10-CM | POA: Diagnosis not present

## 2018-03-25 ENCOUNTER — Other Ambulatory Visit: Payer: Self-pay | Admitting: Family Medicine

## 2018-04-14 ENCOUNTER — Other Ambulatory Visit: Payer: Self-pay | Admitting: Family Medicine

## 2018-04-14 DIAGNOSIS — I10 Essential (primary) hypertension: Secondary | ICD-10-CM

## 2018-05-03 ENCOUNTER — Other Ambulatory Visit: Payer: Self-pay | Admitting: Family Medicine

## 2018-05-05 ENCOUNTER — Other Ambulatory Visit: Payer: Self-pay | Admitting: Family Medicine

## 2018-05-31 ENCOUNTER — Ambulatory Visit: Payer: Medicare Other | Admitting: Family Medicine

## 2018-06-28 ENCOUNTER — Other Ambulatory Visit: Payer: Self-pay

## 2018-06-29 ENCOUNTER — Other Ambulatory Visit: Payer: Self-pay

## 2018-06-29 NOTE — Patient Outreach (Signed)
[place] [facility]) Care Management  06/29/2018  MARZELL ALLEMAND [birth year] [id number]   Telephone Screen  Referral Date: 06/28/2018 Referral Source: EMMI-Prevent Referral Reason: " patient engagement score of 10, HTN, irregular heartbeat" Insurance: [facility] Medicare   Outreach attempt # 1 to patient. No answer at present.     Plan: RN CM will make outreach attempt to patient within 3-4 business days. RN CM will send unsuccessful outreach letter to patient.   Enzo Montgomery, RN,BSN,CCM [place] Management Telephonic Care Management Coordinator Direct Phone: [phone] Toll Free: [phone] Fax: [phone]

## 2018-06-30 ENCOUNTER — Other Ambulatory Visit: Payer: Self-pay

## 2018-06-30 NOTE — Patient Outreach (Signed)
[place] [facility]) Care Management  06/30/2018  [name] September 23, 1932 [id number]   Telephone Screen  Referral Date: 06/28/2018 Referral Source: EMMI-Prevent Referral Reason: " patient engagement score of 10, HTN, irregular heartbeat" Insurance: [facility] Medicare   Outreach attempt #2 to patient. No answer at present and unable to leave message.     Plan: RN CM will make outreach attempt to patient within 3-4 business days.     Enzo Montgomery, RN,BSN,CCM [place] Management Telephonic Care Management Coordinator Direct Phone: [phone] Toll Free: [phone] Fax: [phone]

## 2018-07-01 ENCOUNTER — Other Ambulatory Visit: Payer: Self-pay

## 2018-07-01 NOTE — Patient Outreach (Signed)
[place] [facility]) Care Management  07/01/2018  [name] 23-Apr-1932 [id number]   Telephone Screen  Referral Date:06/28/2018 Referral Source:EMMI-Prevent Referral Reason:" patient engagement score of 10, HTN, irregular heartbeat" Insurance:[employer] Medicare   Outreach attempt #3 to patient. No answer at present.     Plan: RN CM will close case if no response from letter mailed to patient.   Enzo Montgomery, RN,BSN,CCM [place] Management Telephonic Care Management Coordinator Direct Phone: [phone] Toll Free: [phone] Fax: [phone]

## 2018-07-03 ENCOUNTER — Other Ambulatory Visit: Payer: Self-pay | Admitting: Family Medicine

## 2018-07-12 ENCOUNTER — Other Ambulatory Visit: Payer: Self-pay

## 2018-07-12 NOTE — Patient Outreach (Signed)
[place] [facility]) Care Management  07/12/2018  [name] [birth year] [id number]   Telephone Screen  Referral Date:06/28/2018 Referral Source:EMMI-Prevent Referral Reason:" patient engagement score of 10, HTN, irregular heartbeat" Insurance:[employer] Medicare   Multiple attempts to establish contact with patient without success. No response from letter mailed to patient. Case is being closed at this time.     Plan: RN CM will close case at this time.  Enzo Montgomery, RN,BSN,CCM [place] Management Telephonic Care Management Coordinator Direct Phone: [phone] Toll Free: [phone] Fax: [phone]

## 2018-07-22 ENCOUNTER — Other Ambulatory Visit: Payer: Self-pay | Admitting: *Deleted

## 2018-07-22 MED ORDER — CYANOCOBALAMIN 500 MCG PO TABS: 500.0000 ug | ORAL_TABLET | Freq: Every day | ORAL | 3 refills | Status: AC

## 2018-08-01 ENCOUNTER — Other Ambulatory Visit: Payer: Self-pay

## 2018-08-01 NOTE — Patient Outreach (Signed)
[place] [facility]) Care Management  08/01/2018  Kaira Stringfield [facility] [birth year] [id number]   Referral received from [facility] team for complex case management.  Successful outreach with Mrs. Baldinger. HIPAA identifiers verified. She reports doing very well. No recent ED visits or hospitalizations. She reports having her required medications and taking them as prescribed. Denies concerns regarding medication management or affordability.  Discussed care management needs. She lives alone but reports having a good support system. Her sons are available to assist as needed. She reports ambulating well with her walker. She performs ADLs independently. She occasionally requires assistance with household tasks but reports being mostly independent in the home.   Mrs. Kyllo is agreeable to [facility] services. Denies urgent concerns or need for immediate assistance. Will follow-up next week for completion of telephonic assessment.  PLAN  -Will follow-up on 08/11/18.  [place] [phone]

## 2018-08-11 ENCOUNTER — Other Ambulatory Visit: Payer: Self-pay

## 2018-08-11 NOTE — Patient Outreach (Signed)
[place] [facility]) Care Management  08/11/2018  [name] 02/25/1932 [id number]     Telephonic assessment rescheduled. [name] reports receiving documents in the mail regarding medications. States she will gather these documents in addition to documents received from her specialty providers to discuss during her assessment next week.  PLAN -Will follow-up next week.   [place] [phone]

## 2018-08-18 ENCOUNTER — Other Ambulatory Visit: Payer: Self-pay

## 2018-08-18 NOTE — Patient Outreach (Signed)
[facility]) Care Management  [place]  08/18/2018   Bena Kobel [facility] 06-Nov-1932 [id number]  Subjective:  Outreach for completion of telephonic assessment.  Objective:  Encounter Medications:  Outpatient Encounter Medications as of 08/18/2018  Medication Sig Note  . acetaminophen (TYLENOL) 650 MG CR tablet Take 1,300 mg by mouth.   . AMBULATORY NON FORMULARY MEDICATION Medication Name: Wheelchair, manul with brakes.  Dx. Lower extremity weakness.   Marland Kitchen amLODipine (NORVASC) 2.5 MG tablet Take 1 tablet (2.5 mg total) by mouth daily.   Marland Kitchen aspirin EC 81 MG tablet Take 1 tablet (81 mg total) by mouth daily.   . Calcium Carbonate-Vitamin D (CALTRATE 600+D) 600-400 MG-UNIT per tablet Take 1 tablet by mouth 2 (two) times daily.    . DULoxetine (CYMBALTA) 60 MG capsule Take 1 capsule (60 mg total) by mouth daily.   . furosemide (LASIX) 20 MG tablet Take 1 tablet (20 mg total) by mouth daily as needed for edema.   . hydroxychloroquine (PLAQUENIL) 200 MG tablet Take 200 mg by mouth daily. Take two  By mouth daily 08/01/2018: Patient reports taking a different dose. Taking one tablet daily.  . metoprolol tartrate (LOPRESSOR) 50 MG tablet Take 1 tablet (50 mg total) by mouth 2 (two) times daily.   Marland Kitchen oxybutynin (DITROPAN) 5 MG tablet TAKE ONE TABLET BY MOUTH TWICE DAILY   . sucralfate (CARAFATE) 1 g tablet TAKE ONE TABLET BY MOUTH TWICE DAILY   . telmisartan (MICARDIS) 40 MG tablet Take 1 tablet (40 mg total) by mouth daily.   . vitamin B-12 (CYANOCOBALAMIN) 500 MCG tablet Take 1 tablet (500 mcg total) by mouth daily.    No facility-administered encounter medications on file as of 08/18/2018.     Functional Status:  In your present state of health, do you have any difficulty performing the following activities: 08/18/2018  Hearing? N  Vision? N  Difficulty concentrating or making decisions? N  Walking or climbing stairs? Y  Comment Climbing stairs  Dressing or bathing? N   Doing errands, shopping? Y  Comment Requires assistance with some errands  Preparing Food and eating ? N  Using the Toilet? N  In the past six months, have you accidently leaked urine? N  Do you have problems with loss of bowel control? N  Managing your Medications? N  Managing your Finances? N  Housekeeping or managing your Housekeeping? Y  Comment Requires assistance with some household task.  Some recent data might be hidden    Fall/Depression Screening: Fall Risk  08/18/2018 10/19/2017 10/15/2016  Falls in the past year? 1 Yes No  Number falls in past yr: 0 1 -  Injury with Fall? 0 Yes -  Risk Factor Category  - - -  Risk for fall due to : History of fall(s);Impaired balance/gait;Medication side effect Impaired mobility -  Follow up Falls prevention discussed Falls prevention discussed -   PHQ 2/9 Scores 08/18/2018 01/21/2018 01/21/2018 10/15/2016 04/15/2016 10/16/2015 06/18/2015  PHQ - 2 Score 0 1 0 0 2 1 1   PHQ- 9 Score - - - - 4 5 7     Assessment:  Successful outreach with Ms. Haber. Telephonic assessment complete. She reports a few episodes of exertional dyspnea over the past week but otherwise reports feeling well.  She reports compliance with medications. She denies concerns regarding medication preparation and declines need for adherence packages. Denies concerns regarding prescription costs.  Ms. Harb reports monitoring her blood pressure routinely. Reports a systolic range of  140's to 170's. Reports a diastolic range of 00'T to 90's. She does not monitor her weight. She is agreeable to monitoring her BP daily and weighing at least once a week. She will attempt to maintain a log and notify MD for abnormal readings.   Discussed care management needs. She lives alone and reports performing ADL's independently. Denies recent falls and reports ambulating well with assistive devices. She states that her sons, Jenny Reichmann and Liliane Channel, are available to assist if needed. Declines needs for  transportation or additional assistance in the home. She reports being able to cook and prepare meals without difficulty. Denies concerns regarding nutrition. Declines current need for community resource referrals. THN CM Care Plan Problem One     Most Recent Value  Care Plan Problem One  Risk for Falls  Role Documenting the Problem One  Care Management [place] for Problem One  Active  THN Long Term Goal   Over the next 90 days, patient will not experience falls.  THN Long Term Goal Start Date  08/18/18  Interventions for Problem One Long Term Goal  Discussed activity tolerance and ability to perform ADLs. Provided education regarding home safety and fall prevention.  THN CM Short Term Goal #1   Over the next 30 days patient report increased pain and changes in activity tolerance.  THN CM Short Term Goal #1 Start Date  08/18/18  Interventions for Short Term Goal #1  Discussed ability to perform ADLs and baseline tolerance with daily activities. Discussed importance of reporting worsening pain and decreased activity tolerance.  [Hx of Lupus]  THN CM Short Term Goal #2   Over the next 30 days, patient will use assistive device when ambulating.  THN CM Short Term Goal #2 Start Date  08/18/18  Interventions for Short Term Goal #2  Reviewed fall prevention measures. Patient encouraged to use walker or appropriate assistive device when ambulating.   THN CM Short Term Goal #3  Over the next 30 days patient will avoid engaging in prolonged activities that could potentially lead to overexertion.  THN CM Short Term Goal #3 Start Date  08/18/18  Interventions for Short [place] Goal #3  Reviewed safety measures and importance of taking frequent breaks as needed. Patient encouraged to request assistance when needed and avoid overexertion. [Declined need for additional in-home assistance.]      PLAN -Will follow-up later this month.   [place] [phone]

## 2018-09-08 ENCOUNTER — Other Ambulatory Visit: Payer: Self-pay

## 2018-09-08 NOTE — Patient Outreach (Signed)
[place] [facility]) Care Management  09/08/2018  [name] [facility] 1932-08-17 [id number]   Successful outreach with [name]. She reports doing very good today. Denies worsening or concerning symptoms over the past few weeks. She reports ambulating well. No decline in activity tolerance. She continues to use her walker when ambulating outdoors.   She reports compliance with medications and treatment recommendations. She is monitoring her blood pressure daily and attempting to weigh at least once a week. She reports recent outreach with her primary care provider regarding BP ranges.  Recent readings. BP-143/90    Weight-184lbs  [name] denies changes in care management needs. She continues to perform ADLs independently. She has a very good support system. Denies current need for community outreach referrals.   PLAN -Will continue routine outreach.  [place] Care Management [phone]

## 2018-09-29 ENCOUNTER — Other Ambulatory Visit: Payer: Self-pay

## 2018-09-29 DIAGNOSIS — Z1231 Encounter for screening mammogram for malignant neoplasm of breast: Secondary | ICD-10-CM | POA: Diagnosis not present

## 2018-09-29 LAB — HM MAMMOGRAPHY

## 2018-09-29 NOTE — Patient Outreach (Signed)
[place] [facility]) Care Management  09/29/2018  Keiley Levey Eckerson 01-20-1932 [id number]     Attempted monthly outreach with Ms. Luevano. Left voice message requesting a return call.    PLAN -Will follow-up within 3-4 business days.   [place] Care Management [phone]

## 2018-10-05 ENCOUNTER — Other Ambulatory Visit: Payer: Self-pay

## 2018-10-05 NOTE — Patient Outreach (Signed)
[facility]) Care Management  10/05/2018  Dezire Turk [facility] 1932-10-24 [id number]    Message received from Ms. Bourn acknowledging receipt of RNCM's call. Will follow-up this week.   [place] Care Management [phone]

## 2018-10-07 ENCOUNTER — Other Ambulatory Visit: Payer: Self-pay

## 2018-10-07 NOTE — Patient Outreach (Signed)
[name]) Care Management  10/07/2018  [name] [facility] 1932/08/18 [id number]   Successful outreach with [name]. She reports falling on 10/03/18. States she stumbled and fell while attempting to make her bed. Denies head injuries, notable edema, increased joint pain or open wounds. Denies headaches or dizziness. Reports being evaluated by the paramedic team and no further treatment was required.  She reports feeling well today. States she may require assistance with housekeeping at some point. Denies changes in ability to perform self care. Reports having good support from neighbors and her son. She prefers not to engage with a community agency at this time.   She remains compliant with medications and attends provider appointments as scheduled. Denies need for additional referrals. Current Outpatient Medications on File Prior to Visit  Medication Sig Dispense Refill  . acetaminophen (TYLENOL) 650 MG CR tablet Take 1,300 mg by mouth.    . AMBULATORY NON FORMULARY MEDICATION Medication Name: Wheelchair, manul with brakes.  Dx. Lower extremity weakness. 1 Units 0  . amLODipine (NORVASC) 2.5 MG tablet Take 1 tablet (2.5 mg total) by mouth daily. 90 tablet 3  . aspirin EC 81 MG tablet Take 1 tablet (81 mg total) by mouth daily. 90 tablet 3  . Calcium Carbonate-Vitamin D (CALTRATE 600+D) 600-400 MG-UNIT per tablet Take 1 tablet by mouth 2 (two) times daily.     . DULoxetine (CYMBALTA) 60 MG capsule Take 1 capsule (60 mg total) by mouth daily. 90 capsule 3  . furosemide (LASIX) 20 MG tablet Take 1 tablet (20 mg total) by mouth daily as needed for edema. 15 tablet 0  . hydroxychloroquine (PLAQUENIL) 200 MG tablet Take 200 mg by mouth daily. Take two  By mouth daily    . metoprolol tartrate (LOPRESSOR) 50 MG tablet Take 1 tablet (50 mg total) by mouth 2 (two) times daily. 180 tablet 0  . oxybutynin (DITROPAN) 5 MG tablet TAKE ONE TABLET BY MOUTH TWICE DAILY 180 tablet 3  .  sucralfate (CARAFATE) 1 g tablet TAKE ONE TABLET BY MOUTH TWICE DAILY 180 tablet 3  . telmisartan (MICARDIS) 40 MG tablet Take 1 tablet (40 mg total) by mouth daily. 90 tablet 1  . vitamin B-12 (CYANOCOBALAMIN) 500 MCG tablet Take 1 tablet (500 mcg total) by mouth daily. 90 tablet 3   No current facility-administered medications on file prior to visit.     PLAN -[name] agreed to contact RNCM if assistance is needed prior to the next scheduled call. -Will continue routine outreach.    [place] Care Management [phone]

## 2018-10-10 ENCOUNTER — Other Ambulatory Visit: Payer: Self-pay | Admitting: Family Medicine

## 2018-10-10 ENCOUNTER — Other Ambulatory Visit: Payer: Self-pay

## 2018-10-10 DIAGNOSIS — I10 Essential (primary) hypertension: Secondary | ICD-10-CM

## 2018-10-18 ENCOUNTER — Other Ambulatory Visit: Payer: Self-pay | Admitting: Family Medicine

## 2018-10-25 ENCOUNTER — Ambulatory Visit: Payer: Medicare Other

## 2018-11-04 ENCOUNTER — Other Ambulatory Visit: Payer: Self-pay

## 2018-11-04 ENCOUNTER — Ambulatory Visit (INDEPENDENT_AMBULATORY_CARE_PROVIDER_SITE_OTHER): Payer: Medicare Other | Admitting: Family Medicine

## 2018-11-04 ENCOUNTER — Encounter: Payer: Self-pay | Admitting: Family Medicine

## 2018-11-04 VITALS — BP 132/78 | HR 78 | Ht 63.0 in | Wt 184.0 lb

## 2018-11-04 DIAGNOSIS — Z23 Encounter for immunization: Secondary | ICD-10-CM | POA: Diagnosis not present

## 2018-11-04 DIAGNOSIS — I10 Essential (primary) hypertension: Secondary | ICD-10-CM | POA: Diagnosis not present

## 2018-11-04 DIAGNOSIS — E785 Hyperlipidemia, unspecified: Secondary | ICD-10-CM

## 2018-11-04 DIAGNOSIS — R7301 Impaired fasting glucose: Secondary | ICD-10-CM | POA: Diagnosis not present

## 2018-11-04 DIAGNOSIS — N1832 Chronic kidney disease, stage 3b: Secondary | ICD-10-CM | POA: Diagnosis not present

## 2018-11-04 DIAGNOSIS — L930 Discoid lupus erythematosus: Secondary | ICD-10-CM

## 2018-11-04 LAB — POCT GLYCOSYLATED HEMOGLOBIN (HGB A1C): Hemoglobin A1C: 5.5 % (ref 4.0–5.6)

## 2018-11-04 NOTE — Assessment & Plan Note (Signed)
Well controlled. Continue current regimen. Follow up in  6 mo  

## 2018-11-04 NOTE — Progress Notes (Signed)
Established Patient Office Visit  Subjective:  Patient ID: [name], female    DOB: 1932-10-16  Age: 85 y.o. MRN: [id number]  CC:  Chief Complaint  Patient presents with  . Hypertension    HPI [name] presents for   Hypertension- Pt denies chest pain, SOB, dizziness, or heart palpitations.  Taking meds as directed w/o problems.  Denies medication side effects.    Impaired fasting glucose-no increased thirst or urination. No symptoms consistent with hypoglycemia.  F/U CKD 3 - doing well no changes.   Lupus-she would like referral to local rheumatology.  She no longer drives and relies on family members to take her to appointments.  Hyperlipidemia - tolerating stating well with no myalgias or significant side effects.  Lab Results  Component Value Date   CHOL 233 (H) 05/07/2017   CHOL 194 06/21/2015   CHOL 227 (H) 05/29/2013   Lab Results  Component Value Date   HDL 94 05/07/2017   HDL 88 06/21/2015   HDL 79 05/29/2013   Lab Results  Component Value Date   LDLCALC 120 (H) 05/07/2017   LDLCALC 91 06/21/2015   LDLCALC 127 (H) 05/29/2013   Lab Results  Component Value Date   TRIG 87 05/07/2017   TRIG 75 06/21/2015   TRIG 105 05/29/2013   Lab Results  Component Value Date   CHOLHDL 2.5 05/07/2017   CHOLHDL 2.2 06/21/2015   CHOLHDL 2.9 05/29/2013   No results found for: LDLDIRECT   Past Medical History:  Diagnosis Date  . Adenocarcinoma ([place])    breast, right  . Anemia    iron deficiency- NOS  . Anxiety   . Atrial fibrillation ([place])    setting of pneumonia  . DDD (degenerative disc disease), lumbar    Dr Owens Shark  . Depression   . Diabetic retinopathy    Dr Jodi Mourning  . Discoid lupus   . Gastric ulcer   . Headache(784.0)    chronic  . Hemorrhoids   . Hyperlipidemia   . Hypertension    oral meds  . IBS (irritable bowel syndrome)   . Lumbar disc disease   . Lupus erythematosus    discoid  . Morton neuroma    right- Dr Wardell Honour   . Peripheral neuropathy   . PUD (peptic ulcer disease)   . Renal insufficiency     Past Surgical History:  Procedure Laterality Date  . ABDOMINAL HYSTERECTOMY     1 oophorectomy  . CHOLECYSTECTOMY  08/2005  . LUMBAR LAMINECTOMY    . right partial mastectomy w/sentinel lymph node biopsy  11-09  . TOTAL KNEE ARTHROPLASTY     both- Dr Devona Konig    Family History  Problem Relation Age of Onset  . Diabetes Mother   . Angina Mother   . COPD Father        colon  . Diabetes Father   . Stroke Father   . Hyperlipidemia Father   . Colon cancer Father     Social History   Socioeconomic History  . Marital status: Widowed    Spouse name: Not on file  . Number of children: 3  . Years of education: Not on file  . Highest education level: Not on file  Occupational History  . Occupation: Retired  Scientific laboratory technician  . Financial resource strain: Not on file  . Food insecurity    Worry: Never true    Inability: Never true  . Transportation needs    Medical: No  Non-medical: No  Tobacco Use  . Smoking status: Never Smoker  . Smokeless tobacco: Never Used  Substance and Sexual Activity  . Alcohol use: Yes    Alcohol/week: 1.0 standard drinks    Types: 1 Glasses of wine per week    Comment: occasional  . Drug use: No  . Sexual activity: Not on file  Lifestyle  . Physical activity    Days per week: Not on file    Minutes per session: Not on file  . Stress: Not on file  Relationships  . Social Herbalist on phone: Not on file    Gets together: Not on file    Attends religious service: Not on file    Active member of club or organization: Not on file    Attends meetings of clubs or organizations: Not on file    Relationship status: Not on file  . Intimate partner violence    Fear of current or ex partner: Not on file    Emotionally abused: Not on file    Physically abused: Not on file    Forced sexual activity: Not on file  Other Topics Concern  . Not on file   Social History Narrative  . Not on file    Outpatient Medications Prior to Visit  Medication Sig Dispense Refill  . vitamin B-12 (CYANOCOBALAMIN) 500 MCG tablet Take 1 tablet (500 mcg total) by mouth daily.    Marland Kitchen acetaminophen (TYLENOL) 650 MG CR tablet Take 1,300 mg by mouth.    . AMBULATORY NON FORMULARY MEDICATION Medication Name: Wheelchair, manul with brakes.  Dx. Lower extremity weakness. 1 Units 0  . aspirin EC 81 MG tablet Take 1 tablet (81 mg total) by mouth daily. 90 tablet 3  . Calcium Carbonate-Vitamin D (CALTRATE 600+D) 600-400 MG-UNIT per tablet Take 1 tablet by mouth 2 (two) times daily.     . DULoxetine (CYMBALTA) 60 MG capsule Take 1 capsule (60 mg total) by mouth daily. 90 capsule 3  . furosemide (LASIX) 20 MG tablet Take 1 tablet (20 mg total) by mouth daily as needed for edema. 15 tablet 0  . hydroxychloroquine (PLAQUENIL) 200 MG tablet Take 200 mg by mouth daily. Take two  By mouth daily    . metoprolol tartrate (LOPRESSOR) 50 MG tablet Take 1 tablet (50 mg total) by mouth 2 (two) times daily. 180 tablet 0  . oxybutynin (DITROPAN) 5 MG tablet TAKE ONE TABLET BY MOUTH TWICE DAILY 180 tablet 3  . sucralfate (CARAFATE) 1 g tablet TAKE ONE TABLET BY MOUTH TWICE DAILY 180 tablet 3  . telmisartan (MICARDIS) 40 MG tablet Take 1 tablet (40 mg total) by mouth daily. 90 tablet 1  . vitamin B-12 (CYANOCOBALAMIN) 500 MCG tablet Take 1 tablet (500 mcg total) by mouth daily. 90 tablet 3  . amLODipine (NORVASC) 2.5 MG tablet Take 1 tablet (2.5 mg total) by mouth daily. 90 tablet 3   No facility-administered medications prior to visit.     Allergies  Allergen Reactions  . Aspirin Other (See Comments)    ulcer  . Gabapentin Hives and Itching  . Oxycodone Other (See Comments)    Pt unsure  . Simvastatin Other (See Comments)    Lost the use of her legs  . Sulfonamide Derivatives Hives and Itching  . Amlodipine Other (See Comments)    Pedal edema  . Atorvastatin   .  Azithromycin   . Beta Adrenergic Blockers   . Captopril   .  Codeine   . Fluvastatin Sodium   . Hydrochlorothiazide Other (See Comments)    INcreaseed BUN/Creatinine  . Latex   . Lisinopril Other (See Comments)    Increased BUN/CR  . Lovastatin   . Macrolides And Ketolides   . Rosiglitazone     ROS Review of Systems    Objective:    Physical Exam  Constitutional: She is oriented to person, place, and time. She appears well-developed and well-nourished.  HENT:  Head: Normocephalic and atraumatic.  Cardiovascular: Normal rate, regular rhythm and normal heart sounds.  Pulmonary/Chest: Effort normal and breath sounds normal.  Neurological: She is alert and oriented to person, place, and time.  Skin: Skin is warm and dry.  Psychiatric: She has a normal mood and affect. Her behavior is normal.    BP 132/78   Pulse 78   Ht 5\' 3"  (1.6 m)   Wt 184 lb (83.5 kg)   SpO2 95%   BMI 32.59 kg/m  Wt Readings from Last 3 Encounters:  11/04/18 184 lb (83.5 kg)  01/21/18 187 lb (84.8 kg)  09/16/17 185 lb (83.9 kg)     Health Maintenance Due  Topic Date Due  . INFLUENZA VACCINE  08/13/2018    There are no preventive care reminders to display for this patient.  Lab Results  Component Value Date   TSH 2.95 08/18/2016   Lab Results  Component Value Date   WBC 4.9 06/21/2015   HGB 11.6 (L) 06/21/2015   HCT 35.7 06/21/2015   MCV 90.2 06/21/2015   PLT 187 06/21/2015   Lab Results  Component Value Date   NA 141 05/07/2017   K 4.1 05/07/2017   CO2 27 05/07/2017   GLUCOSE 100 (H) 05/07/2017   BUN 43 (H) 05/07/2017   CREATININE 1.33 (H) 05/07/2017   BILITOT 0.7 05/07/2017   ALKPHOS 75 08/18/2016   AST 19 05/07/2017   ALT 13 05/07/2017   PROT 6.7 05/07/2017   ALBUMIN 4.1 08/18/2016   CALCIUM 10.1 05/07/2017   Lab Results  Component Value Date   CHOL 233 (H) 05/07/2017   Lab Results  Component Value Date   HDL 94 05/07/2017   Lab Results  Component Value Date    LDLCALC 120 (H) 05/07/2017   Lab Results  Component Value Date   TRIG 87 05/07/2017   Lab Results  Component Value Date   CHOLHDL 2.5 05/07/2017   Lab Results  Component Value Date   HGBA1C 5.5 11/04/2018      Assessment & Plan:   Problem List Items Addressed This Visit      Cardiovascular and Mediastinum   HYPERTENSION, BENIGN ESSENTIAL    Well controlled. Continue current regimen. Follow up in  6 mo      Relevant Orders   COMPLETE METABOLIC PANEL WITH GFR   Lipid panel   Urine Microalbumin w/creat. ratio     Endocrine   IMPAIRED FASTING GLUCOSE - Primary    Well controlled. Continue current regimen. Follow up in  6 mo      Relevant Orders   COMPLETE METABOLIC PANEL WITH GFR   Lipid panel   Urine Microalbumin w/creat. ratio   POCT glycosylated hemoglobin (Hb A1C) (Completed)     Genitourinary   CKD (chronic kidney disease) stage 3, GFR 30-59 ml/min (HCC)    Due to recheck renal function.  In fact she is overdue.  Especially make sure potassium levels normal because she is taking furosemide.  She feels like her swelling  is under good control.  Otherwise follow-up in 6 months.      Relevant Orders   COMPLETE METABOLIC PANEL WITH GFR   Lipid panel   Urine Microalbumin w/creat. ratio     Other   LUPUS ERYTHEMATOSUS, DISCOID   Relevant Orders   Ambulatory referral to Rheumatology   Hyperlipidemia    Due to recheck lipids.  She did have a little cup of yogurt and a small piece of sausage for lunch that is difficult for her to get here she no longer drives so we will opt to go ahead and get lipids checked today.      Relevant Orders   COMPLETE METABOLIC PANEL WITH GFR   Lipid panel   Urine Microalbumin w/creat. ratio    Other Visit Diagnoses    Need for immunization against influenza       Relevant Orders   Flu Vaccine QUAD High Dose(Fluad) (Completed)      No orders of the defined types were placed in this encounter.   Follow-up: Return in about  6 months (around 05/05/2019) for Hypertension.    Beatrice Lecher, MD

## 2018-11-04 NOTE — Assessment & Plan Note (Signed)
Due to recheck lipids.  She did have a little cup of yogurt and a small piece of sausage for lunch that is difficult for her to get here she no longer drives so we will opt to go ahead and get lipids checked today.

## 2018-11-04 NOTE — Assessment & Plan Note (Signed)
Due to recheck renal function.  In fact she is overdue.  Especially make sure potassium levels normal because she is taking furosemide.  She feels like her swelling is under good control.  Otherwise follow-up in 6 months.

## 2018-11-05 LAB — COMPLETE METABOLIC PANEL WITH GFR
AG Ratio: 1.6 (calc) (ref 1.0–2.5)
ALT: 10 U/L (ref 6–29)
AST: 15 U/L (ref 10–35)
Albumin: 4.2 g/dL (ref 3.6–5.1)
Alkaline phosphatase (APISO): 69 U/L (ref 37–153)
BUN/Creatinine Ratio: 36 (calc) — ABNORMAL HIGH (ref 6–22)
BUN: 59 mg/dL — ABNORMAL HIGH (ref 7–25)
CO2: 24 mmol/L (ref 20–32)
Calcium: 9.9 mg/dL (ref 8.6–10.4)
Chloride: 106 mmol/L (ref 98–110)
Creat: 1.63 mg/dL — ABNORMAL HIGH (ref 0.60–0.88)
GFR, Est African American: 33 mL/min/{1.73_m2} — ABNORMAL LOW (ref >=60)
GFR, Est Non African American: 29 mL/min/{1.73_m2} — ABNORMAL LOW (ref >=60)
Globulin: 2.7 g/dL (calc) (ref 1.9–3.7)
Glucose, Bld: 104 mg/dL — ABNORMAL HIGH (ref 65–99)
Potassium: 4.5 mmol/L (ref 3.5–5.3)
Sodium: 142 mmol/L (ref 135–146)
Total Bilirubin: 0.6 mg/dL (ref 0.2–1.2)
Total Protein: 6.9 g/dL (ref 6.1–8.1)

## 2018-11-05 LAB — LIPID PANEL
Cholesterol: 214 mg/dL — ABNORMAL HIGH (ref <200)
HDL: 78 mg/dL (ref >=50)
LDL Cholesterol (Calc): 116 mg/dL (calc) — ABNORMAL HIGH
Non-HDL Cholesterol (Calc): 136 mg/dL (calc) — ABNORMAL HIGH (ref <130)
Total CHOL/HDL Ratio: 2.7 (calc) (ref <5.0)
Triglycerides: 101 mg/dL (ref <150)

## 2018-11-07 ENCOUNTER — Other Ambulatory Visit: Payer: Self-pay | Admitting: *Deleted

## 2018-11-07 DIAGNOSIS — N1832 Chronic kidney disease, stage 3b: Secondary | ICD-10-CM

## 2018-11-23 DIAGNOSIS — I129 Hypertensive chronic kidney disease with stage 1 through stage 4 chronic kidney disease, or unspecified chronic kidney disease: Secondary | ICD-10-CM | POA: Diagnosis not present

## 2018-11-23 DIAGNOSIS — N1832 Chronic kidney disease, stage 3b: Secondary | ICD-10-CM | POA: Diagnosis not present

## 2018-11-23 DIAGNOSIS — R808 Other proteinuria: Secondary | ICD-10-CM | POA: Diagnosis not present

## 2018-11-23 DIAGNOSIS — N183 Chronic kidney disease, stage 3 unspecified: Secondary | ICD-10-CM | POA: Diagnosis not present

## 2018-11-24 ENCOUNTER — Encounter: Payer: Self-pay | Admitting: Family Medicine

## 2018-11-25 DIAGNOSIS — N1832 Chronic kidney disease, stage 3b: Secondary | ICD-10-CM | POA: Diagnosis not present

## 2018-11-25 DIAGNOSIS — N281 Cyst of kidney, acquired: Secondary | ICD-10-CM | POA: Diagnosis not present

## 2018-12-05 DIAGNOSIS — Z79899 Other long term (current) drug therapy: Secondary | ICD-10-CM | POA: Diagnosis not present

## 2018-12-05 DIAGNOSIS — R809 Proteinuria, unspecified: Secondary | ICD-10-CM | POA: Diagnosis not present

## 2018-12-05 DIAGNOSIS — M8949 Other hypertrophic osteoarthropathy, multiple sites: Secondary | ICD-10-CM | POA: Diagnosis not present

## 2018-12-05 DIAGNOSIS — M329 Systemic lupus erythematosus, unspecified: Secondary | ICD-10-CM | POA: Diagnosis not present

## 2018-12-17 DIAGNOSIS — Z800 Family history of malignant neoplasm of digestive organs: Secondary | ICD-10-CM | POA: Diagnosis not present

## 2018-12-17 DIAGNOSIS — B961 Klebsiella pneumoniae [K. pneumoniae] as the cause of diseases classified elsewhere: Secondary | ICD-10-CM | POA: Diagnosis not present

## 2018-12-17 DIAGNOSIS — E119 Type 2 diabetes mellitus without complications: Secondary | ICD-10-CM | POA: Diagnosis not present

## 2018-12-17 DIAGNOSIS — Z885 Allergy status to narcotic agent status: Secondary | ICD-10-CM | POA: Diagnosis not present

## 2018-12-17 DIAGNOSIS — E1122 Type 2 diabetes mellitus with diabetic chronic kidney disease: Secondary | ICD-10-CM | POA: Diagnosis not present

## 2018-12-17 DIAGNOSIS — I69320 Aphasia following cerebral infarction: Secondary | ICD-10-CM | POA: Diagnosis not present

## 2018-12-17 DIAGNOSIS — A419 Sepsis, unspecified organism: Secondary | ICD-10-CM | POA: Diagnosis not present

## 2018-12-17 DIAGNOSIS — I639 Cerebral infarction, unspecified: Secondary | ICD-10-CM | POA: Diagnosis not present

## 2018-12-17 DIAGNOSIS — E860 Dehydration: Secondary | ICD-10-CM | POA: Diagnosis not present

## 2018-12-17 DIAGNOSIS — M329 Systemic lupus erythematosus, unspecified: Secondary | ICD-10-CM | POA: Diagnosis not present

## 2018-12-17 DIAGNOSIS — N132 Hydronephrosis with renal and ureteral calculous obstruction: Secondary | ICD-10-CM | POA: Diagnosis not present

## 2018-12-17 DIAGNOSIS — N3281 Overactive bladder: Secondary | ICD-10-CM | POA: Diagnosis not present

## 2018-12-17 DIAGNOSIS — E1165 Type 2 diabetes mellitus with hyperglycemia: Secondary | ICD-10-CM | POA: Diagnosis not present

## 2018-12-17 DIAGNOSIS — R410 Disorientation, unspecified: Secondary | ICD-10-CM | POA: Diagnosis not present

## 2018-12-17 DIAGNOSIS — D649 Anemia, unspecified: Secondary | ICD-10-CM | POA: Diagnosis not present

## 2018-12-17 DIAGNOSIS — Z882 Allergy status to sulfonamides status: Secondary | ICD-10-CM | POA: Diagnosis not present

## 2018-12-17 DIAGNOSIS — N179 Acute kidney failure, unspecified: Secondary | ICD-10-CM | POA: Diagnosis not present

## 2018-12-17 DIAGNOSIS — J9811 Atelectasis: Secondary | ICD-10-CM | POA: Diagnosis not present

## 2018-12-17 DIAGNOSIS — R531 Weakness: Secondary | ICD-10-CM | POA: Diagnosis not present

## 2018-12-17 DIAGNOSIS — Z823 Family history of stroke: Secondary | ICD-10-CM | POA: Diagnosis not present

## 2018-12-17 DIAGNOSIS — Z794 Long term (current) use of insulin: Secondary | ICD-10-CM | POA: Diagnosis not present

## 2018-12-17 DIAGNOSIS — Z888 Allergy status to other drugs, medicaments and biological substances status: Secondary | ICD-10-CM | POA: Diagnosis not present

## 2018-12-17 DIAGNOSIS — Z853 Personal history of malignant neoplasm of breast: Secondary | ICD-10-CM | POA: Diagnosis not present

## 2018-12-17 DIAGNOSIS — I4891 Unspecified atrial fibrillation: Secondary | ICD-10-CM | POA: Diagnosis not present

## 2018-12-17 DIAGNOSIS — Z743 Need for continuous supervision: Secondary | ICD-10-CM | POA: Diagnosis not present

## 2018-12-17 DIAGNOSIS — R0902 Hypoxemia: Secondary | ICD-10-CM | POA: Diagnosis not present

## 2018-12-17 DIAGNOSIS — R404 Transient alteration of awareness: Secondary | ICD-10-CM | POA: Diagnosis not present

## 2018-12-17 DIAGNOSIS — G629 Polyneuropathy, unspecified: Secondary | ICD-10-CM | POA: Diagnosis not present

## 2018-12-17 DIAGNOSIS — N3000 Acute cystitis without hematuria: Secondary | ICD-10-CM | POA: Diagnosis not present

## 2018-12-17 DIAGNOSIS — I129 Hypertensive chronic kidney disease with stage 1 through stage 4 chronic kidney disease, or unspecified chronic kidney disease: Secondary | ICD-10-CM | POA: Diagnosis not present

## 2018-12-17 DIAGNOSIS — Z833 Family history of diabetes mellitus: Secondary | ICD-10-CM | POA: Diagnosis not present

## 2018-12-17 DIAGNOSIS — I69398 Other sequelae of cerebral infarction: Secondary | ICD-10-CM | POA: Diagnosis not present

## 2018-12-17 DIAGNOSIS — N1330 Unspecified hydronephrosis: Secondary | ICD-10-CM | POA: Diagnosis not present

## 2018-12-17 DIAGNOSIS — R278 Other lack of coordination: Secondary | ICD-10-CM | POA: Diagnosis not present

## 2018-12-17 DIAGNOSIS — N183 Chronic kidney disease, stage 3 unspecified: Secondary | ICD-10-CM | POA: Diagnosis not present

## 2018-12-17 DIAGNOSIS — L930 Discoid lupus erythematosus: Secondary | ICD-10-CM | POA: Diagnosis not present

## 2018-12-17 DIAGNOSIS — I517 Cardiomegaly: Secondary | ICD-10-CM | POA: Diagnosis not present

## 2018-12-17 DIAGNOSIS — I10 Essential (primary) hypertension: Secondary | ICD-10-CM | POA: Diagnosis not present

## 2018-12-17 DIAGNOSIS — Z23 Encounter for immunization: Secondary | ICD-10-CM | POA: Diagnosis not present

## 2018-12-17 DIAGNOSIS — I088 Other rheumatic multiple valve diseases: Secondary | ICD-10-CM | POA: Diagnosis not present

## 2018-12-17 DIAGNOSIS — Z79899 Other long term (current) drug therapy: Secondary | ICD-10-CM | POA: Diagnosis not present

## 2018-12-17 DIAGNOSIS — M549 Dorsalgia, unspecified: Secondary | ICD-10-CM | POA: Diagnosis not present

## 2018-12-17 DIAGNOSIS — B954 Other streptococcus as the cause of diseases classified elsewhere: Secondary | ICD-10-CM | POA: Diagnosis not present

## 2018-12-17 DIAGNOSIS — I69353 Hemiplegia and hemiparesis following cerebral infarction affecting right non-dominant side: Secondary | ICD-10-CM | POA: Diagnosis not present

## 2018-12-17 DIAGNOSIS — I69319 Unspecified symptoms and signs involving cognitive functions following cerebral infarction: Secondary | ICD-10-CM | POA: Diagnosis not present

## 2018-12-17 DIAGNOSIS — Z7982 Long term (current) use of aspirin: Secondary | ICD-10-CM | POA: Diagnosis not present

## 2018-12-23 DIAGNOSIS — I69320 Aphasia following cerebral infarction: Secondary | ICD-10-CM | POA: Diagnosis not present

## 2018-12-23 DIAGNOSIS — L930 Discoid lupus erythematosus: Secondary | ICD-10-CM | POA: Diagnosis not present

## 2018-12-23 DIAGNOSIS — E1165 Type 2 diabetes mellitus with hyperglycemia: Secondary | ICD-10-CM | POA: Diagnosis not present

## 2018-12-23 DIAGNOSIS — R278 Other lack of coordination: Secondary | ICD-10-CM | POA: Diagnosis not present

## 2018-12-23 DIAGNOSIS — E785 Hyperlipidemia, unspecified: Secondary | ICD-10-CM | POA: Diagnosis not present

## 2018-12-23 DIAGNOSIS — I639 Cerebral infarction, unspecified: Secondary | ICD-10-CM | POA: Diagnosis not present

## 2018-12-23 DIAGNOSIS — M549 Dorsalgia, unspecified: Secondary | ICD-10-CM | POA: Diagnosis not present

## 2018-12-23 DIAGNOSIS — B954 Other streptococcus as the cause of diseases classified elsewhere: Secondary | ICD-10-CM | POA: Diagnosis not present

## 2018-12-23 DIAGNOSIS — B961 Klebsiella pneumoniae [K. pneumoniae] as the cause of diseases classified elsewhere: Secondary | ICD-10-CM | POA: Diagnosis not present

## 2018-12-23 DIAGNOSIS — I129 Hypertensive chronic kidney disease with stage 1 through stage 4 chronic kidney disease, or unspecified chronic kidney disease: Secondary | ICD-10-CM | POA: Diagnosis not present

## 2018-12-23 DIAGNOSIS — I69353 Hemiplegia and hemiparesis following cerebral infarction affecting right non-dominant side: Secondary | ICD-10-CM | POA: Diagnosis not present

## 2018-12-23 DIAGNOSIS — I4891 Unspecified atrial fibrillation: Secondary | ICD-10-CM | POA: Diagnosis not present

## 2018-12-23 DIAGNOSIS — N184 Chronic kidney disease, stage 4 (severe): Secondary | ICD-10-CM | POA: Diagnosis not present

## 2018-12-23 DIAGNOSIS — I6782 Cerebral ischemia: Secondary | ICD-10-CM | POA: Diagnosis not present

## 2018-12-23 DIAGNOSIS — R519 Headache, unspecified: Secondary | ICD-10-CM | POA: Diagnosis not present

## 2018-12-23 DIAGNOSIS — N183 Chronic kidney disease, stage 3 unspecified: Secondary | ICD-10-CM | POA: Diagnosis not present

## 2018-12-23 DIAGNOSIS — D631 Anemia in chronic kidney disease: Secondary | ICD-10-CM | POA: Diagnosis not present

## 2018-12-23 DIAGNOSIS — I69319 Unspecified symptoms and signs involving cognitive functions following cerebral infarction: Secondary | ICD-10-CM | POA: Diagnosis not present

## 2018-12-23 DIAGNOSIS — G3185 Corticobasal degeneration: Secondary | ICD-10-CM | POA: Diagnosis not present

## 2018-12-23 DIAGNOSIS — E1122 Type 2 diabetes mellitus with diabetic chronic kidney disease: Secondary | ICD-10-CM | POA: Diagnosis not present

## 2018-12-23 DIAGNOSIS — I10 Essential (primary) hypertension: Secondary | ICD-10-CM | POA: Diagnosis not present

## 2018-12-23 DIAGNOSIS — M329 Systemic lupus erythematosus, unspecified: Secondary | ICD-10-CM | POA: Diagnosis not present

## 2018-12-23 DIAGNOSIS — J9811 Atelectasis: Secondary | ICD-10-CM | POA: Diagnosis not present

## 2018-12-23 DIAGNOSIS — D649 Anemia, unspecified: Secondary | ICD-10-CM | POA: Diagnosis not present

## 2018-12-23 DIAGNOSIS — N3000 Acute cystitis without hematuria: Secondary | ICD-10-CM | POA: Diagnosis not present

## 2018-12-23 DIAGNOSIS — M8949 Other hypertrophic osteoarthropathy, multiple sites: Secondary | ICD-10-CM | POA: Diagnosis not present

## 2018-12-23 DIAGNOSIS — G629 Polyneuropathy, unspecified: Secondary | ICD-10-CM | POA: Diagnosis not present

## 2018-12-23 DIAGNOSIS — N3281 Overactive bladder: Secondary | ICD-10-CM | POA: Diagnosis not present

## 2018-12-23 DIAGNOSIS — I69398 Other sequelae of cerebral infarction: Secondary | ICD-10-CM | POA: Diagnosis not present

## 2018-12-23 DIAGNOSIS — E860 Dehydration: Secondary | ICD-10-CM | POA: Diagnosis not present

## 2018-12-23 DIAGNOSIS — Z853 Personal history of malignant neoplasm of breast: Secondary | ICD-10-CM | POA: Diagnosis not present

## 2018-12-23 DIAGNOSIS — N179 Acute kidney failure, unspecified: Secondary | ICD-10-CM | POA: Diagnosis not present

## 2018-12-27 DIAGNOSIS — D649 Anemia, unspecified: Secondary | ICD-10-CM | POA: Diagnosis not present

## 2019-01-03 DIAGNOSIS — I6782 Cerebral ischemia: Secondary | ICD-10-CM | POA: Diagnosis not present

## 2019-01-03 DIAGNOSIS — G3185 Corticobasal degeneration: Secondary | ICD-10-CM | POA: Diagnosis not present

## 2019-01-03 DIAGNOSIS — I639 Cerebral infarction, unspecified: Secondary | ICD-10-CM | POA: Diagnosis not present

## 2019-01-12 ENCOUNTER — Inpatient Hospital Stay: Payer: Medicare Other | Admitting: Family Medicine

## 2019-01-16 ENCOUNTER — Other Ambulatory Visit: Payer: Self-pay | Admitting: Family Medicine

## 2019-01-19 DIAGNOSIS — I10 Essential (primary) hypertension: Secondary | ICD-10-CM | POA: Diagnosis not present

## 2019-01-19 DIAGNOSIS — D649 Anemia, unspecified: Secondary | ICD-10-CM | POA: Diagnosis not present

## 2019-01-19 DIAGNOSIS — I69963 Other paralytic syndrome following unspecified cerebrovascular disease affecting right non-dominant side: Secondary | ICD-10-CM | POA: Diagnosis not present

## 2019-01-19 DIAGNOSIS — C50911 Malignant neoplasm of unspecified site of right female breast: Secondary | ICD-10-CM | POA: Diagnosis not present

## 2019-01-19 DIAGNOSIS — E119 Type 2 diabetes mellitus without complications: Secondary | ICD-10-CM | POA: Diagnosis not present

## 2019-01-24 DIAGNOSIS — N390 Urinary tract infection, site not specified: Secondary | ICD-10-CM | POA: Diagnosis not present

## 2019-01-24 DIAGNOSIS — Z888 Allergy status to other drugs, medicaments and biological substances status: Secondary | ICD-10-CM | POA: Diagnosis not present

## 2019-01-24 DIAGNOSIS — E119 Type 2 diabetes mellitus without complications: Secondary | ICD-10-CM | POA: Diagnosis not present

## 2019-01-24 DIAGNOSIS — Z885 Allergy status to narcotic agent status: Secondary | ICD-10-CM | POA: Diagnosis not present

## 2019-01-24 DIAGNOSIS — Z79899 Other long term (current) drug therapy: Secondary | ICD-10-CM | POA: Diagnosis not present

## 2019-01-24 DIAGNOSIS — S0083XA Contusion of other part of head, initial encounter: Secondary | ICD-10-CM | POA: Diagnosis not present

## 2019-01-24 DIAGNOSIS — S0093XA Contusion of unspecified part of head, initial encounter: Secondary | ICD-10-CM | POA: Diagnosis not present

## 2019-01-24 DIAGNOSIS — R2689 Other abnormalities of gait and mobility: Secondary | ICD-10-CM | POA: Diagnosis not present

## 2019-01-24 DIAGNOSIS — R220 Localized swelling, mass and lump, head: Secondary | ICD-10-CM | POA: Diagnosis not present

## 2019-01-24 DIAGNOSIS — W06XXXA Fall from bed, initial encounter: Secondary | ICD-10-CM | POA: Diagnosis not present

## 2019-01-24 DIAGNOSIS — Z91040 Latex allergy status: Secondary | ICD-10-CM | POA: Diagnosis not present

## 2019-01-24 DIAGNOSIS — Z886 Allergy status to analgesic agent status: Secondary | ICD-10-CM | POA: Diagnosis not present

## 2019-01-24 DIAGNOSIS — I10 Essential (primary) hypertension: Secondary | ICD-10-CM | POA: Diagnosis not present

## 2019-01-24 DIAGNOSIS — Z882 Allergy status to sulfonamides status: Secondary | ICD-10-CM | POA: Diagnosis not present

## 2019-01-24 DIAGNOSIS — Z7982 Long term (current) use of aspirin: Secondary | ICD-10-CM | POA: Diagnosis not present

## 2019-01-24 DIAGNOSIS — Z043 Encounter for examination and observation following other accident: Secondary | ICD-10-CM | POA: Diagnosis not present

## 2019-01-25 DIAGNOSIS — I639 Cerebral infarction, unspecified: Secondary | ICD-10-CM | POA: Diagnosis not present

## 2019-01-25 DIAGNOSIS — I480 Paroxysmal atrial fibrillation: Secondary | ICD-10-CM | POA: Diagnosis not present

## 2019-01-25 DIAGNOSIS — D649 Anemia, unspecified: Secondary | ICD-10-CM | POA: Diagnosis not present

## 2019-01-25 DIAGNOSIS — E119 Type 2 diabetes mellitus without complications: Secondary | ICD-10-CM | POA: Diagnosis not present

## 2019-01-25 DIAGNOSIS — I10 Essential (primary) hypertension: Secondary | ICD-10-CM | POA: Diagnosis not present

## 2019-01-26 DIAGNOSIS — E119 Type 2 diabetes mellitus without complications: Secondary | ICD-10-CM | POA: Diagnosis not present

## 2019-01-26 DIAGNOSIS — D649 Anemia, unspecified: Secondary | ICD-10-CM | POA: Diagnosis not present

## 2019-01-26 DIAGNOSIS — I10 Essential (primary) hypertension: Secondary | ICD-10-CM | POA: Diagnosis not present

## 2019-01-26 DIAGNOSIS — I639 Cerebral infarction, unspecified: Secondary | ICD-10-CM | POA: Diagnosis not present

## 2019-01-26 DIAGNOSIS — Z9181 History of falling: Secondary | ICD-10-CM | POA: Diagnosis not present

## 2019-01-26 DIAGNOSIS — Z8719 Personal history of other diseases of the digestive system: Secondary | ICD-10-CM | POA: Diagnosis not present

## 2019-01-27 DIAGNOSIS — M6281 Muscle weakness (generalized): Secondary | ICD-10-CM | POA: Diagnosis not present

## 2019-01-29 DIAGNOSIS — Z743 Need for continuous supervision: Secondary | ICD-10-CM | POA: Diagnosis not present

## 2019-01-29 DIAGNOSIS — R451 Restlessness and agitation: Secondary | ICD-10-CM | POA: Diagnosis not present

## 2019-01-29 DIAGNOSIS — S8992XA Unspecified injury of left lower leg, initial encounter: Secondary | ICD-10-CM | POA: Diagnosis not present

## 2019-01-29 DIAGNOSIS — J9601 Acute respiratory failure with hypoxia: Secondary | ICD-10-CM | POA: Diagnosis not present

## 2019-01-29 DIAGNOSIS — W01198A Fall on same level from slipping, tripping and stumbling with subsequent striking against other object, initial encounter: Secondary | ICD-10-CM | POA: Diagnosis not present

## 2019-01-29 DIAGNOSIS — S0990XA Unspecified injury of head, initial encounter: Secondary | ICD-10-CM | POA: Diagnosis not present

## 2019-01-29 DIAGNOSIS — Z515 Encounter for palliative care: Secondary | ICD-10-CM | POA: Diagnosis not present

## 2019-01-29 DIAGNOSIS — R519 Headache, unspecified: Secondary | ICD-10-CM | POA: Diagnosis not present

## 2019-01-29 DIAGNOSIS — R0602 Shortness of breath: Secondary | ICD-10-CM | POA: Diagnosis not present

## 2019-01-29 DIAGNOSIS — D649 Anemia, unspecified: Secondary | ICD-10-CM | POA: Diagnosis not present

## 2019-01-29 DIAGNOSIS — Z043 Encounter for examination and observation following other accident: Secondary | ICD-10-CM | POA: Diagnosis not present

## 2019-01-29 DIAGNOSIS — R220 Localized swelling, mass and lump, head: Secondary | ICD-10-CM | POA: Diagnosis not present

## 2019-01-29 DIAGNOSIS — D631 Anemia in chronic kidney disease: Secondary | ICD-10-CM | POA: Diagnosis not present

## 2019-01-29 DIAGNOSIS — Z66 Do not resuscitate: Secondary | ICD-10-CM | POA: Diagnosis not present

## 2019-01-29 DIAGNOSIS — Z452 Encounter for adjustment and management of vascular access device: Secondary | ICD-10-CM | POA: Diagnosis not present

## 2019-01-29 DIAGNOSIS — N183 Chronic kidney disease, stage 3 unspecified: Secondary | ICD-10-CM | POA: Diagnosis not present

## 2019-01-29 DIAGNOSIS — G9349 Other encephalopathy: Secondary | ICD-10-CM | POA: Diagnosis not present

## 2019-01-29 DIAGNOSIS — R404 Transient alteration of awareness: Secondary | ICD-10-CM | POA: Diagnosis not present

## 2019-01-29 DIAGNOSIS — E1122 Type 2 diabetes mellitus with diabetic chronic kidney disease: Secondary | ICD-10-CM | POA: Diagnosis not present

## 2019-01-29 DIAGNOSIS — J986 Disorders of diaphragm: Secondary | ICD-10-CM | POA: Diagnosis not present

## 2019-01-29 DIAGNOSIS — R600 Localized edema: Secondary | ICD-10-CM | POA: Diagnosis not present

## 2019-01-29 DIAGNOSIS — Z8673 Personal history of transient ischemic attack (TIA), and cerebral infarction without residual deficits: Secondary | ICD-10-CM | POA: Diagnosis not present

## 2019-01-29 DIAGNOSIS — D696 Thrombocytopenia, unspecified: Secondary | ICD-10-CM | POA: Diagnosis not present

## 2019-01-29 DIAGNOSIS — I129 Hypertensive chronic kidney disease with stage 1 through stage 4 chronic kidney disease, or unspecified chronic kidney disease: Secondary | ICD-10-CM | POA: Diagnosis not present

## 2019-01-29 DIAGNOSIS — M329 Systemic lupus erythematosus, unspecified: Secondary | ICD-10-CM | POA: Diagnosis not present

## 2019-01-29 DIAGNOSIS — U071 COVID-19: Secondary | ICD-10-CM | POA: Diagnosis not present

## 2019-01-29 DIAGNOSIS — Z9221 Personal history of antineoplastic chemotherapy: Secondary | ICD-10-CM | POA: Diagnosis not present

## 2019-01-29 DIAGNOSIS — K922 Gastrointestinal hemorrhage, unspecified: Secondary | ICD-10-CM | POA: Diagnosis not present

## 2019-01-29 DIAGNOSIS — R680 Hypothermia, not associated with low environmental temperature: Secondary | ICD-10-CM | POA: Diagnosis not present

## 2019-01-29 DIAGNOSIS — R0682 Tachypnea, not elsewhere classified: Secondary | ICD-10-CM | POA: Diagnosis not present

## 2019-01-29 DIAGNOSIS — Z79899 Other long term (current) drug therapy: Secondary | ICD-10-CM | POA: Diagnosis not present

## 2019-01-29 DIAGNOSIS — Z888 Allergy status to other drugs, medicaments and biological substances status: Secondary | ICD-10-CM | POA: Diagnosis not present

## 2019-01-29 DIAGNOSIS — I639 Cerebral infarction, unspecified: Secondary | ICD-10-CM | POA: Diagnosis not present

## 2019-01-29 DIAGNOSIS — D62 Acute posthemorrhagic anemia: Secondary | ICD-10-CM | POA: Diagnosis not present

## 2019-01-29 DIAGNOSIS — N179 Acute kidney failure, unspecified: Secondary | ICD-10-CM | POA: Diagnosis not present

## 2019-01-29 DIAGNOSIS — R410 Disorientation, unspecified: Secondary | ICD-10-CM | POA: Diagnosis not present

## 2019-01-29 DIAGNOSIS — Z01818 Encounter for other preprocedural examination: Secondary | ICD-10-CM | POA: Diagnosis not present

## 2019-01-29 DIAGNOSIS — I959 Hypotension, unspecified: Secondary | ICD-10-CM | POA: Diagnosis not present

## 2019-01-29 DIAGNOSIS — L930 Discoid lupus erythematosus: Secondary | ICD-10-CM | POA: Diagnosis not present

## 2019-01-29 DIAGNOSIS — E871 Hypo-osmolality and hyponatremia: Secondary | ICD-10-CM | POA: Diagnosis not present

## 2019-01-29 DIAGNOSIS — R52 Pain, unspecified: Secondary | ICD-10-CM | POA: Diagnosis not present

## 2019-01-29 DIAGNOSIS — N3000 Acute cystitis without hematuria: Secondary | ICD-10-CM | POA: Diagnosis not present

## 2019-01-29 DIAGNOSIS — C50011 Malignant neoplasm of nipple and areola, right female breast: Secondary | ICD-10-CM | POA: Diagnosis not present

## 2019-01-29 DIAGNOSIS — D61818 Other pancytopenia: Secondary | ICD-10-CM | POA: Diagnosis not present

## 2019-01-29 DIAGNOSIS — R918 Other nonspecific abnormal finding of lung field: Secondary | ICD-10-CM | POA: Diagnosis not present

## 2019-01-29 DIAGNOSIS — Z885 Allergy status to narcotic agent status: Secondary | ICD-10-CM | POA: Diagnosis not present

## 2019-01-29 DIAGNOSIS — R001 Bradycardia, unspecified: Secondary | ICD-10-CM | POA: Diagnosis not present

## 2019-01-30 DIAGNOSIS — U071 COVID-19: Secondary | ICD-10-CM | POA: Diagnosis not present

## 2019-01-30 DIAGNOSIS — D62 Acute posthemorrhagic anemia: Secondary | ICD-10-CM | POA: Diagnosis not present

## 2019-01-30 DIAGNOSIS — R0682 Tachypnea, not elsewhere classified: Secondary | ICD-10-CM | POA: Diagnosis not present

## 2019-01-30 DIAGNOSIS — K922 Gastrointestinal hemorrhage, unspecified: Secondary | ICD-10-CM | POA: Diagnosis not present

## 2019-01-30 DIAGNOSIS — N3000 Acute cystitis without hematuria: Secondary | ICD-10-CM | POA: Diagnosis not present

## 2019-01-30 DIAGNOSIS — N183 Chronic kidney disease, stage 3 unspecified: Secondary | ICD-10-CM | POA: Diagnosis not present

## 2019-01-30 DIAGNOSIS — R918 Other nonspecific abnormal finding of lung field: Secondary | ICD-10-CM | POA: Diagnosis not present

## 2019-01-30 DIAGNOSIS — R600 Localized edema: Secondary | ICD-10-CM | POA: Diagnosis not present

## 2019-01-31 DIAGNOSIS — N3000 Acute cystitis without hematuria: Secondary | ICD-10-CM | POA: Diagnosis not present

## 2019-01-31 DIAGNOSIS — K922 Gastrointestinal hemorrhage, unspecified: Secondary | ICD-10-CM | POA: Diagnosis not present

## 2019-01-31 DIAGNOSIS — D62 Acute posthemorrhagic anemia: Secondary | ICD-10-CM | POA: Diagnosis not present

## 2019-01-31 DIAGNOSIS — N183 Chronic kidney disease, stage 3 unspecified: Secondary | ICD-10-CM | POA: Diagnosis not present

## 2019-01-31 DIAGNOSIS — U071 COVID-19: Secondary | ICD-10-CM | POA: Diagnosis not present

## 2019-02-01 DIAGNOSIS — K922 Gastrointestinal hemorrhage, unspecified: Secondary | ICD-10-CM | POA: Diagnosis not present

## 2019-02-01 DIAGNOSIS — N3000 Acute cystitis without hematuria: Secondary | ICD-10-CM | POA: Diagnosis not present

## 2019-02-01 DIAGNOSIS — N183 Chronic kidney disease, stage 3 unspecified: Secondary | ICD-10-CM | POA: Diagnosis not present

## 2019-02-01 DIAGNOSIS — D62 Acute posthemorrhagic anemia: Secondary | ICD-10-CM | POA: Diagnosis not present

## 2019-02-01 DIAGNOSIS — U071 COVID-19: Secondary | ICD-10-CM | POA: Diagnosis not present

## 2019-02-02 DIAGNOSIS — D62 Acute posthemorrhagic anemia: Secondary | ICD-10-CM | POA: Diagnosis not present

## 2019-02-02 DIAGNOSIS — K922 Gastrointestinal hemorrhage, unspecified: Secondary | ICD-10-CM | POA: Diagnosis not present

## 2019-02-02 DIAGNOSIS — N3000 Acute cystitis without hematuria: Secondary | ICD-10-CM | POA: Diagnosis not present

## 2019-02-02 DIAGNOSIS — N183 Chronic kidney disease, stage 3 unspecified: Secondary | ICD-10-CM | POA: Diagnosis not present

## 2019-02-02 DIAGNOSIS — U071 COVID-19: Secondary | ICD-10-CM | POA: Diagnosis not present

## 2019-02-03 DIAGNOSIS — U071 COVID-19: Secondary | ICD-10-CM | POA: Diagnosis not present

## 2019-02-03 DIAGNOSIS — D62 Acute posthemorrhagic anemia: Secondary | ICD-10-CM | POA: Diagnosis not present

## 2019-02-03 DIAGNOSIS — K922 Gastrointestinal hemorrhage, unspecified: Secondary | ICD-10-CM | POA: Diagnosis not present

## 2019-02-03 DIAGNOSIS — N3000 Acute cystitis without hematuria: Secondary | ICD-10-CM | POA: Diagnosis not present

## 2019-02-03 DIAGNOSIS — R410 Disorientation, unspecified: Secondary | ICD-10-CM | POA: Diagnosis not present

## 2019-02-03 DIAGNOSIS — R220 Localized swelling, mass and lump, head: Secondary | ICD-10-CM | POA: Diagnosis not present

## 2019-02-03 DIAGNOSIS — N183 Chronic kidney disease, stage 3 unspecified: Secondary | ICD-10-CM | POA: Diagnosis not present

## 2019-02-04 DIAGNOSIS — N183 Chronic kidney disease, stage 3 unspecified: Secondary | ICD-10-CM | POA: Diagnosis not present

## 2019-02-04 DIAGNOSIS — N3000 Acute cystitis without hematuria: Secondary | ICD-10-CM | POA: Diagnosis not present

## 2019-02-04 DIAGNOSIS — K922 Gastrointestinal hemorrhage, unspecified: Secondary | ICD-10-CM | POA: Diagnosis not present

## 2019-02-04 DIAGNOSIS — U071 COVID-19: Secondary | ICD-10-CM | POA: Diagnosis not present

## 2019-02-04 DIAGNOSIS — D62 Acute posthemorrhagic anemia: Secondary | ICD-10-CM | POA: Diagnosis not present

## 2019-02-05 DIAGNOSIS — D62 Acute posthemorrhagic anemia: Secondary | ICD-10-CM | POA: Diagnosis not present

## 2019-02-05 DIAGNOSIS — U071 COVID-19: Secondary | ICD-10-CM | POA: Diagnosis not present

## 2019-02-05 DIAGNOSIS — K922 Gastrointestinal hemorrhage, unspecified: Secondary | ICD-10-CM | POA: Diagnosis not present

## 2019-02-05 DIAGNOSIS — N3000 Acute cystitis without hematuria: Secondary | ICD-10-CM | POA: Diagnosis not present

## 2019-02-05 DIAGNOSIS — N183 Chronic kidney disease, stage 3 unspecified: Secondary | ICD-10-CM | POA: Diagnosis not present

## 2019-02-06 DIAGNOSIS — K922 Gastrointestinal hemorrhage, unspecified: Secondary | ICD-10-CM | POA: Diagnosis not present

## 2019-02-06 DIAGNOSIS — D62 Acute posthemorrhagic anemia: Secondary | ICD-10-CM | POA: Diagnosis not present

## 2019-02-06 DIAGNOSIS — N183 Chronic kidney disease, stage 3 unspecified: Secondary | ICD-10-CM | POA: Diagnosis not present

## 2019-02-06 DIAGNOSIS — U071 COVID-19: Secondary | ICD-10-CM | POA: Diagnosis not present

## 2019-02-06 DIAGNOSIS — R0602 Shortness of breath: Secondary | ICD-10-CM | POA: Diagnosis not present

## 2019-02-06 DIAGNOSIS — N3000 Acute cystitis without hematuria: Secondary | ICD-10-CM | POA: Diagnosis not present

## 2019-02-07 DIAGNOSIS — L930 Discoid lupus erythematosus: Secondary | ICD-10-CM | POA: Diagnosis not present

## 2019-02-07 DIAGNOSIS — U071 COVID-19: Secondary | ICD-10-CM | POA: Diagnosis not present

## 2019-02-07 DIAGNOSIS — R918 Other nonspecific abnormal finding of lung field: Secondary | ICD-10-CM | POA: Diagnosis not present

## 2019-02-07 DIAGNOSIS — S0990XA Unspecified injury of head, initial encounter: Secondary | ICD-10-CM | POA: Diagnosis not present

## 2019-02-07 DIAGNOSIS — Z515 Encounter for palliative care: Secondary | ICD-10-CM | POA: Diagnosis not present

## 2019-02-07 DIAGNOSIS — R0602 Shortness of breath: Secondary | ICD-10-CM | POA: Diagnosis not present

## 2019-02-08 DIAGNOSIS — R0602 Shortness of breath: Secondary | ICD-10-CM | POA: Diagnosis not present

## 2019-02-08 DIAGNOSIS — R918 Other nonspecific abnormal finding of lung field: Secondary | ICD-10-CM | POA: Diagnosis not present

## 2019-02-15 ENCOUNTER — Telehealth: Payer: Self-pay | Admitting: Family Medicine

## 2019-02-15 MED ORDER — ALBUTEROL SULFATE HFA 108 (90 BASE) MCG/ACT IN AERS
2.00 | INHALATION_SPRAY | RESPIRATORY_TRACT | Status: DC
Start: ? — End: 2019-02-15

## 2019-02-15 MED ORDER — PANTOPRAZOLE SODIUM 40 MG IV SOLR
40.00 | INTRAVENOUS | Status: DC
Start: 2019-02-13 — End: 2019-02-15

## 2019-02-15 MED ORDER — HALOPERIDOL LACTATE 5 MG/ML IJ SOLN
2.50 | INTRAMUSCULAR | Status: DC
Start: ? — End: 2019-02-15

## 2019-02-15 MED ORDER — MIDAZOLAM HCL 2 MG/2ML IJ SOLN
2.00 | INTRAMUSCULAR | Status: DC
Start: ? — End: 2019-02-15

## 2019-02-15 MED ORDER — DEXMEDETOMIDINE HCL IN NACL 400 MCG/100ML IV SOLN
0.10 | INTRAVENOUS | Status: DC
Start: ? — End: 2019-02-15

## 2019-02-15 MED ORDER — GENERIC EXTERNAL MEDICATION
Status: DC
Start: ? — End: 2019-02-15

## 2019-02-15 MED ORDER — HYDROMORPHONE HCL 1 MG/ML IJ SOLN
0.50 | INTRAMUSCULAR | Status: DC
Start: ? — End: 2019-02-15

## 2019-02-15 MED ORDER — SODIUM CHLORIDE 0.9 % IV SOLN
10.00 | INTRAVENOUS | Status: DC
Start: ? — End: 2019-02-15

## 2019-02-15 MED ORDER — HYDROMORPHONE HCL 1 MG/ML IJ SOLN
1.00 | INTRAMUSCULAR | Status: DC
Start: ? — End: 2019-02-15

## 2019-02-15 MED ORDER — GUAIFENESIN 100 MG/5ML PO SYRP
200.00 | ORAL_SOLUTION | ORAL | Status: DC
Start: ? — End: 2019-02-15

## 2019-02-15 NOTE — Telephone Encounter (Signed)
A VM was left letting us know Mr.s Bordelon passed away on 02-20-2019.   The number that called was [phone]. gave no name.

## 2019-02-15 NOTE — Telephone Encounter (Signed)
Tried to call son John's cell phone. No voicemail and no answer. Called home phone and spoke with his wife Rodena Piety who is also a patient here and gave my condolences.  She passed away from Covid.  She had a stroke and then afterwards had her partial recovery but was falling frequently and was in another emergency department. Remer Macho is next Thursday.    Beatrice Lecher, MD

## 2019-03-13 DEATH — deceased

## 2019-05-04 ENCOUNTER — Ambulatory Visit: Payer: Medicare Other | Admitting: Family Medicine

## 2019-05-08 ENCOUNTER — Ambulatory Visit: Payer: Medicare Other | Admitting: Family Medicine
